# Patient Record
Sex: Male | Born: 1955 | Race: White | Hispanic: No | Marital: Married | Smoking: Former smoker
Health system: Southern US, Community
[De-identification: ages and names within clinical notes are randomized; demographics above are authoritative.]

## PROBLEM LIST (undated history)

## (undated) DIAGNOSIS — I779 Disorder of arteries and arterioles, unspecified: Secondary | ICD-10-CM

## (undated) DIAGNOSIS — K219 Gastro-esophageal reflux disease without esophagitis: Secondary | ICD-10-CM

## (undated) DIAGNOSIS — I2510 Atherosclerotic heart disease of native coronary artery without angina pectoris: Secondary | ICD-10-CM

## (undated) DIAGNOSIS — F1910 Other psychoactive substance abuse, uncomplicated: Secondary | ICD-10-CM

## (undated) DIAGNOSIS — Z860100 Personal history of colon polyps, unspecified: Secondary | ICD-10-CM

## (undated) DIAGNOSIS — J45909 Unspecified asthma, uncomplicated: Secondary | ICD-10-CM

## (undated) DIAGNOSIS — M1990 Unspecified osteoarthritis, unspecified site: Secondary | ICD-10-CM

## (undated) DIAGNOSIS — E785 Hyperlipidemia, unspecified: Secondary | ICD-10-CM

## (undated) DIAGNOSIS — I6529 Occlusion and stenosis of unspecified carotid artery: Secondary | ICD-10-CM

## (undated) DIAGNOSIS — Z9289 Personal history of other medical treatment: Secondary | ICD-10-CM

## (undated) DIAGNOSIS — I10 Essential (primary) hypertension: Secondary | ICD-10-CM

## (undated) DIAGNOSIS — C801 Malignant (primary) neoplasm, unspecified: Secondary | ICD-10-CM

## (undated) HISTORY — DX: Disorder of arteries and arterioles, unspecified: I77.9

## (undated) HISTORY — DX: Malignant (primary) neoplasm, unspecified: C80.1

## (undated) HISTORY — PX: TONSILLECTOMY AND ADENOIDECTOMY: SUR1326

## (undated) HISTORY — PX: TUBAL LIGATION: SHX77

## (undated) HISTORY — DX: Personal history of colon polyps, unspecified: Z86.0100

## (undated) HISTORY — DX: Atherosclerotic heart disease of native coronary artery without angina pectoris: I25.10

## (undated) HISTORY — DX: Personal history of other medical treatment: Z92.89

## (undated) HISTORY — DX: Essential (primary) hypertension: I10

## (undated) HISTORY — DX: Hyperlipidemia, unspecified: E78.5

## (undated) HISTORY — DX: Unspecified osteoarthritis, unspecified site: M19.90

## (undated) HISTORY — DX: Unspecified asthma, uncomplicated: J45.909

## (undated) HISTORY — DX: Other psychoactive substance abuse, uncomplicated: F19.10

## (undated) HISTORY — DX: Personal history of colonic polyps: Z86.010

## (undated) HISTORY — PX: CYST EXCISION PERINEAL: SHX6278

## (undated) HISTORY — DX: Occlusion and stenosis of unspecified carotid artery: I65.29

---

## 2004-10-16 ENCOUNTER — Ambulatory Visit: Payer: Self-pay | Admitting: Gastroenterology

## 2007-06-15 HISTORY — PX: CAROTID ENDARTERECTOMY: SUR193

## 2008-01-04 ENCOUNTER — Ambulatory Visit: Payer: Self-pay | Admitting: Family Medicine

## 2008-01-04 DIAGNOSIS — Z7289 Other problems related to lifestyle: Secondary | ICD-10-CM | POA: Insufficient documentation

## 2008-01-08 ENCOUNTER — Ambulatory Visit: Payer: Self-pay | Admitting: Vascular Surgery

## 2008-01-09 ENCOUNTER — Ambulatory Visit: Payer: Self-pay | Admitting: Vascular Surgery

## 2008-01-09 ENCOUNTER — Ambulatory Visit: Payer: Self-pay | Admitting: Cardiology

## 2008-01-09 ENCOUNTER — Other Ambulatory Visit: Payer: Self-pay

## 2008-01-11 ENCOUNTER — Inpatient Hospital Stay: Payer: Self-pay | Admitting: Vascular Surgery

## 2008-01-12 ENCOUNTER — Other Ambulatory Visit: Payer: Self-pay

## 2008-03-08 DIAGNOSIS — E785 Hyperlipidemia, unspecified: Secondary | ICD-10-CM | POA: Insufficient documentation

## 2008-03-08 DIAGNOSIS — L570 Actinic keratosis: Secondary | ICD-10-CM | POA: Insufficient documentation

## 2008-04-16 ENCOUNTER — Ambulatory Visit: Payer: Self-pay | Admitting: Gastroenterology

## 2008-04-16 LAB — HM COLONOSCOPY

## 2009-03-27 ENCOUNTER — Ambulatory Visit: Payer: Self-pay | Admitting: Family Medicine

## 2009-05-22 DIAGNOSIS — E559 Vitamin D deficiency, unspecified: Secondary | ICD-10-CM | POA: Insufficient documentation

## 2009-08-14 DIAGNOSIS — I2510 Atherosclerotic heart disease of native coronary artery without angina pectoris: Secondary | ICD-10-CM | POA: Insufficient documentation

## 2012-07-14 LAB — CBC AND DIFFERENTIAL
HCT: 47 % (ref 41–53)
Hemoglobin: 14.3 g/dL (ref 13.5–17.5)
Neutrophils Absolute: 60 /uL
Platelets: 258 10*3/uL (ref 150–399)
WBC: 8.8 10^3/mL

## 2012-07-14 LAB — TSH: TSH: 1.45 u[IU]/mL (ref 0.41–5.90)

## 2012-10-12 DIAGNOSIS — N411 Chronic prostatitis: Secondary | ICD-10-CM | POA: Insufficient documentation

## 2012-10-12 DIAGNOSIS — N401 Enlarged prostate with lower urinary tract symptoms: Secondary | ICD-10-CM | POA: Insufficient documentation

## 2012-10-12 DIAGNOSIS — N138 Other obstructive and reflux uropathy: Secondary | ICD-10-CM | POA: Insufficient documentation

## 2013-02-15 ENCOUNTER — Ambulatory Visit: Payer: Self-pay | Admitting: Unknown Physician Specialty

## 2013-04-12 ENCOUNTER — Ambulatory Visit: Payer: Self-pay | Admitting: Unknown Physician Specialty

## 2013-05-14 LAB — BASIC METABOLIC PANEL
BUN: 17 mg/dL (ref 4–21)
Creatinine: 1 mg/dL (ref 0.6–1.3)
Glucose: 102 mg/dL
Potassium: 4.9 mmol/L (ref 3.4–5.3)
Sodium: 137 mmol/L (ref 137–147)

## 2013-05-14 LAB — HEPATIC FUNCTION PANEL
ALT: 16 U/L (ref 10–40)
AST: 22 U/L (ref 14–40)
Alkaline Phosphatase: 64 U/L (ref 25–125)
Bilirubin, Total: 0.4 mg/dL

## 2013-11-15 LAB — LIPID PANEL
Cholesterol: 181 mg/dL (ref 0–200)
HDL: 43 mg/dL (ref 35–70)
LDL Cholesterol: 114 mg/dL
LDl/HDL Ratio: 2.7
Triglycerides: 118 mg/dL (ref 40–160)

## 2014-05-08 DIAGNOSIS — M1710 Unilateral primary osteoarthritis, unspecified knee: Secondary | ICD-10-CM | POA: Insufficient documentation

## 2014-05-08 DIAGNOSIS — M179 Osteoarthritis of knee, unspecified: Secondary | ICD-10-CM | POA: Insufficient documentation

## 2014-10-08 DIAGNOSIS — Z87898 Personal history of other specified conditions: Secondary | ICD-10-CM | POA: Insufficient documentation

## 2014-10-18 DIAGNOSIS — I10 Essential (primary) hypertension: Secondary | ICD-10-CM | POA: Insufficient documentation

## 2014-10-18 DIAGNOSIS — F17201 Nicotine dependence, unspecified, in remission: Secondary | ICD-10-CM | POA: Insufficient documentation

## 2014-12-05 ENCOUNTER — Ambulatory Visit (INDEPENDENT_AMBULATORY_CARE_PROVIDER_SITE_OTHER): Payer: BLUE CROSS/BLUE SHIELD | Admitting: Family Medicine

## 2014-12-05 ENCOUNTER — Encounter: Payer: Self-pay | Admitting: Family Medicine

## 2014-12-05 VITALS — BP 142/64 | HR 72 | Temp 98.8°F | Resp 16 | Ht 69.0 in | Wt 204.0 lb

## 2014-12-05 DIAGNOSIS — F32A Depression, unspecified: Secondary | ICD-10-CM

## 2014-12-05 DIAGNOSIS — IMO0002 Reserved for concepts with insufficient information to code with codable children: Secondary | ICD-10-CM

## 2014-12-05 DIAGNOSIS — Z0000 Encounter for general adult medical examination without abnormal findings: Secondary | ICD-10-CM

## 2014-12-05 DIAGNOSIS — R739 Hyperglycemia, unspecified: Secondary | ICD-10-CM | POA: Diagnosis not present

## 2014-12-05 DIAGNOSIS — F329 Major depressive disorder, single episode, unspecified: Secondary | ICD-10-CM

## 2014-12-05 DIAGNOSIS — H8113 Benign paroxysmal vertigo, bilateral: Secondary | ICD-10-CM | POA: Diagnosis not present

## 2014-12-05 MED ORDER — BUPROPION HCL ER (XL) 150 MG PO TB24: 150.0000 mg | ORAL_TABLET | Freq: Every day | ORAL | Status: DC

## 2014-12-05 NOTE — Progress Notes (Signed)
Patient ID: Christopher Villegas, male   DOB: 1956-01-15, 59 y.o.   MRN: [id number] Patient: Christopher Villegas, Male    DOB: Sep 04, 1955, 59 y.o.   MRN: [id number] Visit Date: 12/05/2014  Today's Provider: Wilhemena Durie, MD   Chief Complaint  Patient presents with  . Annual Exam   Subjective:  Christopher Villegas is a 59 y.o. male who presents today for health maintenance and complete physical. He feels well. He reports exercising none. He reports he is sleeping well. PSA is updated with Dr. Jacqlyn Larsen in September or October last year and results was about 1.1. Tdap in 2009. Zoster updated. Never had Pneumovax.  Vertigo Positional, when he bends over, when he lays down on the left side of his body and when his chiropractor moves up the table. This last for a few seconds and does not notice this at any other time.  Depression He stopped smoking in April and thinks this is related to that. Feeling down some and little interest in things. Wanted to discuss this today. Patient is sort scores to the PACU 9 but admits to some mild depressed mood and mild anhedonia. He would like to try specifically Wellbutrin for his mild depression   Review of Systems  Constitutional: Negative.   HENT: Negative.   Eyes: Negative.   Respiratory: Negative.   Cardiovascular: Negative.   Gastrointestinal: Negative.   Endocrine: Negative.   Genitourinary: Negative.   Musculoskeletal: Negative.   Skin: Negative.   Allergic/Immunologic: Negative.   Neurological: Positive for dizziness and light-headedness.  Hematological: Negative.   Psychiatric/Behavioral: Positive for dysphoric mood.    History   Social History  . Marital Status: Married    Spouse Name: Corporate treasurer  . Number of Children: 2  . Years of Education: 16   Occupational History  . [employer]    Social History Main Topics  . Smoking status: Former Smoker -- 1.00 packs/day for 40 years    Types: Cigarettes    Quit date: 09/29/2014  . Smokeless tobacco:  Never Used  . Alcohol Use: No  . Drug Use: No  . Sexual Activity: Yes   Other Topics Concern  . Not on file   Social History Narrative    Patient Active Problem List   Diagnosis Date Noted  . BP (high blood pressure) 10/18/2014  . 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 Arthritis of knee, degenerative 05/08/2014  . Chronic prostatitis 10/12/2012  . Benign prostatic hyperplasia with urinary obstruction 10/12/2012  . Atherosclerosis of coronary artery 08/14/2009  . Avitaminosis D 05/22/2009  . Hypercholesteremia 03/08/2008  . Actinic keratoses 03/08/2008  . Alcohol use 01/04/2008    Past Surgical History  Procedure Laterality Date  . Tonsillectomy and adenoidectomy    . Cyst excision perineal    . Carotid endarterectomy      His family history includes Arthritis in his mother; Autoimmune disease in his sister; Cancer in his mother; Healthy in his son and son; Heart disease in his father; Hyperlipidemia in his mother; Hypertension in his father and mother; Kidney failure in his father; Lupus in his sister.    Outpatient Prescriptions Prior to Visit  Medication Sig Dispense Refill  . Cholecalciferol (VITAMIN D) 2000 UNITS tablet Take 1 tablet by mouth daily.    Marland Kitchen aspirin 81 MG tablet Take 1 tablet by mouth daily.    . rosuvastatin (CRESTOR) 10 MG tablet Take 1 tablet by mouth at bedtime.     No  facility-administered medications prior to visit.    Patient Care Team: Jerrol Banana., MD as PCP - General (Family Medicine)     Objective:   Vitals:  Filed Vitals:   12/05/14 0947  BP: 142/64  Pulse: 72  Temp: 98.8 F (37.1 C)  Resp: 16  Height: 5\' 9"  (1.753 m)  Weight: 204 lb (92.534 kg)    Physical Exam  Constitutional: He is oriented to person, place, and time. He appears well-developed and well-nourished.  HENT:  Head: Normocephalic and atraumatic.  Right Ear: External ear normal.  Left Ear: External ear normal.  Nose: Nose normal.  Mouth/Throat:  Oropharynx is clear and moist.  Eyes: Conjunctivae are normal. Pupils are equal, round, and reactive to light.  Neck: Normal range of motion. Neck supple. No tracheal deviation present.  Cardiovascular: Normal rate, regular rhythm, normal heart sounds and intact distal pulses.   No murmur heard. Pulmonary/Chest: Effort normal and breath sounds normal. No respiratory distress. He has no wheezes. He has no rales.  Abdominal: Soft. Bowel sounds are normal. There is no tenderness. There is no rebound.  Musculoskeletal: Normal range of motion. He exhibits no edema or tenderness.  Neurological: He is alert and oriented to person, place, and time.  Skin: Skin is warm and dry.  Psychiatric: He has a normal mood and affect. His behavior is normal.     Depression Screen PHQ 2/9 Scores 12/05/2014  PHQ - 2 Score 2  PHQ- 9 Score 2      Assessment & Plan:     Routine Health Maintenance and Physical Exam  Exercise Activities and Dietary recommendations Goals    None      Immunization History  Administered Date(s) Administered  . Tdap 03/08/2008  . Zoster 04/23/2013    Health Maintenance  Topic Date Due  . HIV Screening  04/30/1969  . INFLUENZA VACCINE  01/13/2015  . TETANUS/TDAP  03/08/2018  . COLONOSCOPY  04/16/2018  . ZOSTAVAX  Completed    1. Annual physical exam Check labs, PSA is done with Dr. Jacqlyn Larsen. - CBC w/Diff - COMPLETE METABOLIC PANEL WITH GFR - Lipid Panel With LDL/HDL Ratio - TSH - POCT Urinalysis Dipstick  2. Positional vertigo, bilateral Follow as needed.  3. Hyperglycemia Check labs due to last level slightly elevated. - HgB A1c  4. Depression New. Start treatment and re check next visit. PHQ9 score 2 today. - buPROPion (WELLBUTRIN XL) 150 MG 24 hr tablet; Take 1 tablet (150 mg total) by mouth daily.  Dispense: 30 tablet; Refill: 12  5. Tobacco abuse Patient quit smoking in 09/29/2014  6. Carotid artery disease last status post carotid artery  endarterectomy Followed by vascular yearly  7. Skin cancer/S CC   I have done the exam and reviewed the above chart and it is accurate to the best of my knowledge.  Patient was seen and examined by Dr. Eulas Post and note was scribed by Theressa Millard, RMA.

## 2014-12-06 ENCOUNTER — Telehealth: Payer: Self-pay

## 2014-12-06 LAB — COMPREHENSIVE METABOLIC PANEL
ALT: 18 IU/L (ref 0–44)
AST: 20 IU/L (ref 0–40)
Albumin/Globulin Ratio: 1.8 (ref 1.1–2.5)
Albumin: 4.6 g/dL (ref 3.6–4.8)
Alkaline Phosphatase: 54 IU/L (ref 39–117)
BUN/Creatinine Ratio: 14 (ref 10–22)
BUN: 15 mg/dL (ref 8–27)
Bilirubin Total: 0.5 mg/dL (ref 0.0–1.2)
CO2: 26 mmol/L (ref 18–29)
Calcium: 9.6 mg/dL (ref 8.6–10.2)
Chloride: 100 mmol/L (ref 97–108)
Creatinine, Ser: 1.1 mg/dL (ref 0.76–1.27)
GFR calc Af Amer: 84 mL/min/{1.73_m2} (ref >59)
GFR calc non Af Amer: 73 mL/min/{1.73_m2} (ref >59)
Globulin, Total: 2.5 g/dL (ref 1.5–4.5)
Glucose: 96 mg/dL (ref 65–99)
Potassium: 5.3 mmol/L — ABNORMAL HIGH (ref 3.5–5.2)
Sodium: 140 mmol/L (ref 134–144)
Total Protein: 7.1 g/dL (ref 6.0–8.5)

## 2014-12-06 LAB — CBC WITH DIFFERENTIAL/PLATELET
Basophils Absolute: 0 10*3/uL (ref 0.0–0.2)
Basos: 0 %
EOS (ABSOLUTE): 0.2 10*3/uL (ref 0.0–0.4)
Eos: 2 %
Hematocrit: 44.6 % (ref 37.5–51.0)
Hemoglobin: 15.4 g/dL (ref 12.6–17.7)
Immature Grans (Abs): 0 10*3/uL (ref 0.0–0.1)
Immature Granulocytes: 0 %
Lymphocytes Absolute: 2.9 10*3/uL (ref 0.7–3.1)
Lymphs: 35 %
MCH: 31.2 pg (ref 26.6–33.0)
MCHC: 34.5 g/dL (ref 31.5–35.7)
MCV: 91 fL (ref 79–97)
Monocytes Absolute: 0.5 10*3/uL (ref 0.1–0.9)
Monocytes: 6 %
Neutrophils Absolute: 4.6 10*3/uL (ref 1.4–7.0)
Neutrophils: 57 %
Platelets: 239 10*3/uL (ref 150–379)
RBC: 4.93 x10E6/uL (ref 4.14–5.80)
RDW: 13.6 % (ref 12.3–15.4)
WBC: 8.1 10*3/uL (ref 3.4–10.8)

## 2014-12-06 LAB — HEMOGLOBIN A1C
Est. average glucose Bld gHb Est-mCnc: 114 mg/dL
Hgb A1c MFr Bld: 5.6 % (ref 4.8–5.6)

## 2014-12-06 LAB — TSH: TSH: 1.19 u[IU]/mL (ref 0.450–4.500)

## 2014-12-06 LAB — LIPID PANEL WITH LDL/HDL RATIO
Cholesterol, Total: 238 mg/dL — ABNORMAL HIGH (ref 100–199)
HDL: 48 mg/dL (ref >39)
LDL Calculated: 173 mg/dL — ABNORMAL HIGH (ref 0–99)
LDl/HDL Ratio: 3.6 ratio units (ref 0.0–3.6)
Triglycerides: 83 mg/dL (ref 0–149)
VLDL Cholesterol Cal: 17 mg/dL (ref 5–40)

## 2014-12-06 NOTE — Telephone Encounter (Signed)
-----   Message from Jerrol Banana., MD sent at 12/06/2014  8:06 AM EDT ----- Labs stable. Cholesterol high. Recommend trying statin again.

## 2014-12-06 NOTE — Telephone Encounter (Signed)
LMTCB with his wife=-aa

## 2014-12-06 NOTE — Telephone Encounter (Signed)
Pt advised. Pt use to be on Crestor last year and his last cholesterol level was controlled on it but his CK was elevated and he had muscle pain so he stopped at that time. He has not tried anything else. Do you want to him to try a different medication? Please review. Pt advised may not be able to call back till you come back from vacation. Thank you-aa

## 2014-12-18 MED ORDER — ROSUVASTATIN CALCIUM 5 MG PO TABS: 5.0000 mg | ORAL_TABLET | Freq: Every day | ORAL | Status: DC

## 2014-12-18 NOTE — Telephone Encounter (Signed)
Spoke with Dr. Nathaneil Canary Crestor at 5 mg daily. Patient advised and RX sent in-aa

## 2015-02-06 ENCOUNTER — Ambulatory Visit (INDEPENDENT_AMBULATORY_CARE_PROVIDER_SITE_OTHER): Payer: BLUE CROSS/BLUE SHIELD | Admitting: Family Medicine

## 2015-02-06 ENCOUNTER — Encounter: Payer: Self-pay | Admitting: Family Medicine

## 2015-02-06 VITALS — BP 120/64 | HR 78 | Temp 98.3°F | Resp 16 | Wt 207.0 lb

## 2015-02-06 DIAGNOSIS — F329 Major depressive disorder, single episode, unspecified: Secondary | ICD-10-CM | POA: Diagnosis not present

## 2015-02-06 DIAGNOSIS — F32A Depression, unspecified: Secondary | ICD-10-CM

## 2015-02-06 NOTE — Progress Notes (Signed)
       Patient: Christopher Villegas Male    DOB: January 22, 1956   59 y.o.   MRN: [id number] Visit Date: 02/06/2015  Today's Provider: Wilhemena Durie, MD   Chief Complaint  Patient presents with  . Depression    follow- up   Subjective:    HPI Depression follow-up: Last office visit was 12/05/2014. During that visit patient scored a 2 on the PHQ-P Depression screening tool. Patient was started on Bupropion XL 150mg  daily.  Patient comes in today stating he feels better since starting the Buproprion. Patient reports good compliance with treatment, good tolerance and good symptom control.     Allergies  Allergen Reactions  . Plavix [Clopidogrel] Rash   Previous Medications   BUPROPION (WELLBUTRIN XL) 150 MG 24 HR TABLET    Take 1 tablet (150 mg total) by mouth daily.   CHOLECALCIFEROL (VITAMIN D) 2000 UNITS TABLET    Take 1 tablet by mouth daily.   MISC NATURAL PRODUCTS (OSTEO BI-FLEX JOINT SHIELD PO)    Take 2 tablets by mouth daily.   OMEGA-3 FATTY ACIDS (FISH OIL) 1200 MG CAPS    Take 2 capsules by mouth 2 (two) times daily.   ROSUVASTATIN (CRESTOR) 5 MG TABLET    Take 1 tablet (5 mg total) by mouth daily.    Review of Systems  Constitutional: Negative.   Respiratory: Negative.   Cardiovascular: Negative.   Psychiatric/Behavioral: Positive for sleep disturbance (sometimes has trouble getting back to sleep in the middle of the night if awakend).  All other systems reviewed and are negative.   Social History  Substance Use Topics  . Smoking status: Former Smoker -- 1.00 packs/day for 40 years    Types: Cigarettes    Quit date: 09/29/2014  . Smokeless tobacco: Never Used  . Alcohol Use: No   Objective:   BP 120/64 mmHg  Pulse 78  Temp(Src) 98.3 F (36.8 C) (Oral)  Resp 16  Wt 207 lb (93.895 kg)  SpO2 96%  Physical Exam  Constitutional: He appears well-developed and well-nourished.  HENT:  Head: Normocephalic and atraumatic.  Right Ear: External ear normal.  Left Ear:  External ear normal.  Nose: Nose normal.  Eyes: Conjunctivae are normal.  Neck: Neck supple.  Cardiovascular: Normal rate, regular rhythm and normal heart sounds.   Pulmonary/Chest: Effort normal and breath sounds normal.  Abdominal: Soft.  Skin: Skin is warm and dry.  Psychiatric: He has a normal mood and affect. His behavior is normal. Judgment and thought content normal.        Assessment & Plan:       Depression  Markedly improved with bupropion. Discussed this with patient. Continue present dose for at least a year.  ASCVD  All risk factors treated and patient has quit smoking. I have done the exam and reviewed the above chart and it is accurate to the best of my knowledge.       Lj Miyamoto Cranford Mon, MD  [facility]

## 2015-02-24 ENCOUNTER — Other Ambulatory Visit: Payer: Self-pay | Admitting: Family Medicine

## 2015-02-24 NOTE — Telephone Encounter (Signed)
yes

## 2015-02-24 NOTE — Telephone Encounter (Signed)
Advised patient as below. Called in Wellbutrin SR into patient's pharmacy. Updated med list.

## 2015-02-24 NOTE — Telephone Encounter (Signed)
Please advise. Thanks.  

## 2015-02-24 NOTE — Telephone Encounter (Signed)
Pt wants to know if you can change his Wellbutrin Xl to SR.    Call back [phone]  Thanks Con Memos

## 2015-03-21 ENCOUNTER — Telehealth: Payer: Self-pay | Admitting: Family Medicine

## 2015-03-21 NOTE — Telephone Encounter (Signed)
Patient states he wants to stop Bupropion, this medication is contributing to him not sleeping well and he is sure it is from medication because this issue started when he started this medication. He wants to try and stay off of it for now. Spoke with Dr. Caryn Section and he said there is really no tapering schedule for this medication and with it been a low dose patient can go ahead and just stop taking it and we will follow. Patient advised-aa

## 2015-03-21 NOTE — Telephone Encounter (Signed)
Pt is requesting a call back.  Pt has questions about changing/stopping medication.  CB#[phone]/MW

## 2015-08-14 ENCOUNTER — Ambulatory Visit (INDEPENDENT_AMBULATORY_CARE_PROVIDER_SITE_OTHER): Payer: BLUE CROSS/BLUE SHIELD | Admitting: Family Medicine

## 2015-08-14 ENCOUNTER — Encounter: Payer: Self-pay | Admitting: Family Medicine

## 2015-08-14 VITALS — BP 156/82 | HR 72 | Resp 18 | Wt 209.0 lb

## 2015-08-14 DIAGNOSIS — I10 Essential (primary) hypertension: Secondary | ICD-10-CM

## 2015-08-14 DIAGNOSIS — F32A Depression, unspecified: Secondary | ICD-10-CM | POA: Insufficient documentation

## 2015-08-14 DIAGNOSIS — K219 Gastro-esophageal reflux disease without esophagitis: Secondary | ICD-10-CM | POA: Diagnosis not present

## 2015-08-14 DIAGNOSIS — R252 Cramp and spasm: Secondary | ICD-10-CM

## 2015-08-14 DIAGNOSIS — R739 Hyperglycemia, unspecified: Secondary | ICD-10-CM | POA: Insufficient documentation

## 2015-08-14 DIAGNOSIS — E7800 Pure hypercholesterolemia, unspecified: Secondary | ICD-10-CM | POA: Diagnosis not present

## 2015-08-14 DIAGNOSIS — F329 Major depressive disorder, single episode, unspecified: Secondary | ICD-10-CM | POA: Diagnosis not present

## 2015-08-14 MED ORDER — ESOMEPRAZOLE MAGNESIUM 40 MG PO CPDR: 40.0000 mg | DELAYED_RELEASE_CAPSULE | Freq: Every day | ORAL | Status: DC

## 2015-08-14 MED ORDER — LOSARTAN POTASSIUM 50 MG PO TABS: 50.0000 mg | ORAL_TABLET | Freq: Every day | ORAL | Status: DC

## 2015-08-14 NOTE — Patient Instructions (Signed)
For hand cramps try Over the counter magnesium Oxide twice daily for 1 month then add tonic water if symptoms are not improving or persisting.

## 2015-08-14 NOTE — Progress Notes (Signed)
Patient ID: Christopher Villegas, male   DOB: 1955-08-29, 60 y.o.   MRN: [id number]    Subjective:  HPI  Patient is here for 6 months follow up.  Hyperlipidemia: Patient started to take Crestor the past 2 weeks not since June of 2016 when we spoke with him after looking at results. He forgot. Lab Results  Component Value Date   CHOL 238* 12/05/2014   HDL 48 12/05/2014   LDLCALC 173* 12/05/2014   TRIG 83 12/05/2014   Depression: He stopped Bupropion in October due to he was having hard time sleeping since been on this medication. He states he has been doing fine without medication sleep wise and emotionally. He does not want to start another medication at this time.  Heartburn: HE has had issues with this since his last visit, mainly at night time. He states he knows that it is how he is eating and how much and how late in the evening, he has adjusted his diet and heartburn is better with this. He also took Nexium 2 weeks on and then off and then waited a few weeks and symptoms came back so he went back on it for 2 weeks but he is not sure how long he should be taking this.  Prior to Admission medications   Medication Sig Start Date End Date Taking? Authorizing Provider  Cholecalciferol (VITAMIN D) 2000 UNITS tablet Take 1 tablet by mouth daily. 09/20/11  Yes Historical Provider, MD  Misc Natural Products (OSTEO BI-FLEX JOINT SHIELD PO) Take 2 tablets by mouth daily.   Yes Historical Provider, MD  Omega-3 Fatty Acids (FISH OIL) 1200 MG CAPS Take 2 capsules by mouth 2 (two) times daily.   Yes Historical Provider, MD  rosuvastatin (CRESTOR) 5 MG tablet Take 1 tablet (5 mg total) by mouth daily. 12/18/14  Yes Dejha King Maceo Pro., MD    Patient Active Problem List   Diagnosis Date Noted  . BP (high blood pressure) 10/18/2014  . 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 H/O neoplasm 10/08/2014  . Arthritis of knee, degenerative 05/08/2014  . Chronic prostatitis 10/12/2012  . Benign prostatic  hyperplasia with urinary obstruction 10/12/2012  . Atherosclerosis of coronary artery 08/14/2009  . Avitaminosis D 05/22/2009  . Hypercholesteremia 03/08/2008  . Actinic keratoses 03/08/2008  . Alcohol use ([place]) 01/04/2008    No past medical history on file.  Social History   Social History  . Marital Status: Married    Spouse Name: Corporate treasurer  . Number of Children: 2  . Years of Education: 16   Occupational History  . [employer]    Social History Main Topics  . Smoking status: Former Smoker -- 1.00 packs/day for 40 years    Types: Cigarettes    Quit date: 09/29/2014  . Smokeless tobacco: Never Used  . Alcohol Use: No  . Drug Use: No  . Sexual Activity: Yes   Other Topics Concern  . Not on file   Social History Narrative    Allergies  Allergen Reactions  . Plavix [Clopidogrel] Rash    Review of Systems  Constitutional: Negative.   Respiratory: Negative.   Cardiovascular: Negative.   Gastrointestinal: Positive for heartburn.  Musculoskeletal: Negative.   Psychiatric/Behavioral: Negative.     Immunization History  Administered Date(s) Administered  . Tdap 03/08/2008  . Zoster 04/23/2013   Objective:  BP 156/82 mmHg  Pulse 72  Resp 18  Wt 209 lb (94.802 kg)  Physical Exam  Constitutional:  He is oriented to person, place, and time and well-developed, well-nourished, and in no distress.  HENT:  Head: Normocephalic and atraumatic.  Eyes: Conjunctivae are normal. Pupils are equal, round, and reactive to light.  Neck: Normal range of motion. Neck supple.  Cardiovascular: Normal rate, regular rhythm, normal heart sounds and intact distal pulses.   No murmur heard. Pulmonary/Chest: Effort normal and breath sounds normal. No respiratory distress. He has no wheezes.  Abdominal: Soft.  Musculoskeletal: He exhibits no edema or tenderness.  Neurological: He is alert and oriented to person, place, and time. Gait normal.  Skin: Skin is warm and  dry.  Psychiatric: Mood, memory, affect and judgment normal.    Lab Results  Component Value Date   WBC 8.1 12/05/2014   HGB 14.3 07/14/2012   HCT 44.6 12/05/2014   PLT 239 12/05/2014   GLUCOSE 96 12/05/2014   CHOL 238* 12/05/2014   TRIG 83 12/05/2014   HDL 48 12/05/2014   LDLCALC 173* 12/05/2014   TSH 1.190 12/05/2014   HGBA1C 5.6 12/05/2014    CMP     Component Value Date/Time   NA 140 12/05/2014 1042   K 5.3* 12/05/2014 1042   CL 100 12/05/2014 1042   CO2 26 12/05/2014 1042   GLUCOSE 96 12/05/2014 1042   BUN 15 12/05/2014 1042   CREATININE 1.10 12/05/2014 1042   CREATININE 1.0 05/14/2013   CALCIUM 9.6 12/05/2014 1042   PROT 7.1 12/05/2014 1042   ALBUMIN 4.6 12/05/2014 1042   AST 20 12/05/2014 1042   ALT 18 12/05/2014 1042   ALKPHOS 54 12/05/2014 1042   BILITOT 0.5 12/05/2014 1042   GFRNONAA 73 12/05/2014 1042   GFRAA 84 12/05/2014 1042    Assessment and Plan :  1. Depression Stable off medication. Follow. - TSH  2. Essential hypertension elevated today. Will start medication. - losartan (COZAAR) 50 MG tablet; Take 1 tablet (50 mg total) by mouth daily.  Dispense: 30 tablet; Refill: 12 - TSH  3. Hypercholesteremia Check levels on medication. - Comprehensive metabolic panel - Lipid Panel With LDL/HDL Ratio  4. Gastroesophageal reflux disease without esophagitis New. Will start medication. Discussed working on his habits also. - esomeprazole (NEXIUM) 40 MG capsule; Take 1 capsule (40 mg total) by mouth daily.  Dispense: 30 capsule; Refill: 12 - CBC with Differential/Platelet - TSH  5. Hyperglycemia - HgB A1c  6. Hand cramps Try mag oxide twice daily and then tonic water if mag ox does not work.  Patient was seen and examined by Dr. Eulas Post and note was scribed by Theressa Millard, RMA. I have done the exam and reviewed the above chart and it is accurate to the best of my knowledge.    Miguel Aschoff MD [facility] 08/14/2015 8:05 AM

## 2015-08-19 LAB — CBC WITH DIFFERENTIAL/PLATELET
Basophils Absolute: 0.1 10*3/uL (ref 0.0–0.2)
Basos: 1 %
EOS (ABSOLUTE): 0.1 10*3/uL (ref 0.0–0.4)
Eos: 1 %
Hematocrit: 44.3 % (ref 37.5–51.0)
Hemoglobin: 16.2 g/dL (ref 12.6–17.7)
Immature Grans (Abs): 0 10*3/uL (ref 0.0–0.1)
Immature Granulocytes: 0 %
Lymphocytes Absolute: 3.5 10*3/uL — ABNORMAL HIGH (ref 0.7–3.1)
Lymphs: 34 %
MCH: 32.3 pg (ref 26.6–33.0)
MCHC: 36.6 g/dL — ABNORMAL HIGH (ref 31.5–35.7)
MCV: 88 fL (ref 79–97)
Monocytes Absolute: 0.7 10*3/uL (ref 0.1–0.9)
Monocytes: 7 %
Neutrophils Absolute: 6.1 10*3/uL (ref 1.4–7.0)
Neutrophils: 57 %
Platelets: 370 10*3/uL (ref 150–379)
RBC: 5.01 x10E6/uL (ref 4.14–5.80)
RDW: 13 % (ref 12.3–15.4)
WBC: 10.5 10*3/uL (ref 3.4–10.8)

## 2015-08-19 LAB — COMPREHENSIVE METABOLIC PANEL
ALT: 22 IU/L (ref 0–44)
AST: 27 IU/L (ref 0–40)
Albumin/Globulin Ratio: 1.8 (ref 1.1–2.5)
Albumin: 4.7 g/dL (ref 3.6–4.8)
Alkaline Phosphatase: 68 IU/L (ref 39–117)
BUN/Creatinine Ratio: 13 (ref 10–22)
BUN: 16 mg/dL (ref 8–27)
Bilirubin Total: 0.6 mg/dL (ref 0.0–1.2)
CO2: 23 mmol/L (ref 18–29)
Calcium: 9.4 mg/dL (ref 8.6–10.2)
Chloride: 94 mmol/L — ABNORMAL LOW (ref 96–106)
Creatinine, Ser: 1.26 mg/dL (ref 0.76–1.27)
GFR calc Af Amer: 71 mL/min/{1.73_m2} (ref >59)
GFR calc non Af Amer: 61 mL/min/{1.73_m2} (ref >59)
Globulin, Total: 2.6 g/dL (ref 1.5–4.5)
Glucose: 100 mg/dL — ABNORMAL HIGH (ref 65–99)
Potassium: 5.2 mmol/L (ref 3.5–5.2)
Sodium: 134 mmol/L (ref 134–144)
Total Protein: 7.3 g/dL (ref 6.0–8.5)

## 2015-08-19 LAB — TSH: TSH: 1.05 u[IU]/mL (ref 0.450–4.500)

## 2015-08-19 LAB — LIPID PANEL WITH LDL/HDL RATIO
Cholesterol, Total: 161 mg/dL (ref 100–199)
HDL: 41 mg/dL (ref >39)
LDL Calculated: 99 mg/dL (ref 0–99)
LDl/HDL Ratio: 2.4 ratio units (ref 0.0–3.6)
Triglycerides: 106 mg/dL (ref 0–149)
VLDL Cholesterol Cal: 21 mg/dL (ref 5–40)

## 2015-08-19 LAB — HEMOGLOBIN A1C
Est. average glucose Bld gHb Est-mCnc: 114 mg/dL
Hgb A1c MFr Bld: 5.6 % (ref 4.8–5.6)

## 2015-10-16 ENCOUNTER — Ambulatory Visit (INDEPENDENT_AMBULATORY_CARE_PROVIDER_SITE_OTHER): Payer: BLUE CROSS/BLUE SHIELD | Admitting: Family Medicine

## 2015-10-16 VITALS — BP 124/62 | HR 72 | Temp 98.2°F | Resp 16 | Wt 184.0 lb

## 2015-10-16 DIAGNOSIS — I10 Essential (primary) hypertension: Secondary | ICD-10-CM

## 2015-10-16 DIAGNOSIS — K219 Gastro-esophageal reflux disease without esophagitis: Secondary | ICD-10-CM | POA: Diagnosis not present

## 2015-10-16 NOTE — Progress Notes (Signed)
Patient ID: Christopher Villegas, male   DOB: [birth year], 60 y.o.   MRN: [id number]    Subjective:  HPI  Patient is here for 2 months follow up.  Hypertension: reading was elevated last time and patient was started on Losartan, he has no issues tolerating this medication so far. He checks his b/p every Sunday and the highest reading has been 138/72, most of the time stays in 131-132/67-69. BP Readings from Last 3 Encounters:  10/16/15 124/62  08/14/15 156/82  02/06/15 120/64   GERD: Patient was started on Nexium on his last visit. He states symptoms are much better, the only time he has had some heartburn is when he does extrenious exercise or labor he feels it some. He has also lost 25lb since his last visit and he has not tried to diet but just started to eat slower and chew his food more and he gets full faster and stops eating unlike before for example he could eat 6 hot dogs and fries for lunch, 19 chicken legs in 1 sitting. He feels better since making these changes.  Prior to Admission medications   Medication Sig Start Date End Date Taking? Authorizing Provider  Cholecalciferol (VITAMIN D) 2000 UNITS tablet Take 1 tablet by mouth daily. 09/20/11  Yes Historical Provider, MD  esomeprazole (NEXIUM) 40 MG capsule Take 1 capsule (40 mg total) by mouth daily. 08/14/15  Yes Richard Maceo Pro., MD  losartan (COZAAR) 50 MG tablet Take 1 tablet (50 mg total) by mouth daily. 08/14/15  Yes Richard Maceo Pro., MD  Misc Natural Products (OSTEO BI-FLEX JOINT SHIELD PO) Take 2 tablets by mouth daily.   Yes Historical Provider, MD  Omega-3 Fatty Acids (FISH OIL) 1200 MG CAPS Take 2 capsules by mouth 2 (two) times daily.   Yes Historical Provider, MD  rosuvastatin (CRESTOR) 5 MG tablet Take 1 tablet (5 mg total) by mouth daily. 12/18/14  Yes Richard Maceo Pro., MD    Patient Active Problem List   Diagnosis Date Noted  . Hyperglycemia 08/14/2015  . Gastroesophageal reflux disease without esophagitis  08/14/2015  . Depression 08/14/2015  . BP (high blood pressure) 10/18/2014  . 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 H/O neoplasm 10/08/2014  . Arthritis of knee, degenerative 05/08/2014  . Chronic prostatitis 10/12/2012  . Benign prostatic hyperplasia with urinary obstruction 10/12/2012  . Atherosclerosis of coronary artery 08/14/2009  . Avitaminosis D 05/22/2009  . Hypercholesteremia 03/08/2008  . Actinic keratoses 03/08/2008  . Alcohol use ([place]) 01/04/2008    No past medical history on file.  Social History   Social History  . Marital Status: Married    Spouse Name: Corporate treasurer  . Number of Children: 2  . Years of Education: 16   Occupational History  . [employer]    Social History Main Topics  . Smoking status: Former Smoker -- 1.00 packs/day for 40 years    Types: Cigarettes    Quit date: 09/29/2014  . Smokeless tobacco: Never Used  . Alcohol Use: No  . Drug Use: No  . Sexual Activity: Yes   Other Topics Concern  . Not on file   Social History Narrative    Allergies  Allergen Reactions  . Plavix [Clopidogrel] Rash    Review of Systems  Constitutional: Negative.   Respiratory: Negative.   Cardiovascular: Negative.   Gastrointestinal: Negative.   Musculoskeletal: Negative.     Immunization History  Administered Date(s) Administered  . Tdap 03/08/2008  .  Zoster 04/23/2013   Objective:  BP 124/62 mmHg  Pulse 72  Temp(Src) 98.2 F (36.8 C)  Resp 16  Wt 184 lb (83.462 kg)  Physical Exam  Constitutional: He is oriented to person, place, and time and well-developed, well-nourished, and in no distress.  HENT:  Head: Normocephalic and atraumatic.  Eyes: Conjunctivae are normal. Pupils are equal, round, and reactive to light.  Neck: Normal range of motion. Neck supple.  Cardiovascular: Normal rate, regular rhythm, normal heart sounds and intact distal pulses.   No murmur heard. Pulmonary/Chest: Effort normal and breath sounds  normal. No respiratory distress. He has no wheezes.  Musculoskeletal: He exhibits no edema or tenderness.  Neurological: He is alert and oriented to person, place, and time. Gait normal.  Psychiatric: Mood, memory, affect and judgment normal.    Lab Results  Component Value Date   WBC 10.5 08/18/2015   HGB 14.3 07/14/2012   HCT 44.3 08/18/2015   PLT 370 08/18/2015   GLUCOSE 100* 08/18/2015   CHOL 161 08/18/2015   TRIG 106 08/18/2015   HDL 41 08/18/2015   LDLCALC 99 08/18/2015   TSH 1.050 08/18/2015   HGBA1C 5.6 08/18/2015    CMP     Component Value Date/Time   NA 134 08/18/2015 0800   K 5.2 08/18/2015 0800   CL 94* 08/18/2015 0800   CO2 23 08/18/2015 0800   GLUCOSE 100* 08/18/2015 0800   BUN 16 08/18/2015 0800   CREATININE 1.26 08/18/2015 0800   CREATININE 1.0 05/14/2013   CALCIUM 9.4 08/18/2015 0800   PROT 7.3 08/18/2015 0800   ALBUMIN 4.7 08/18/2015 0800   AST 27 08/18/2015 0800   ALT 22 08/18/2015 0800   ALKPHOS 68 08/18/2015 0800   BILITOT 0.6 08/18/2015 0800   GFRNONAA 61 08/18/2015 0800   GFRAA 71 08/18/2015 0800    Assessment and Plan :  1. Essential hypertension Better. Continue current medication.  2. Gastroesophageal reflux disease without esophagitis Better on Nexium. Patient states he has had some chest discomfort/heartburn/regurgitation at times. Discussed this in details with patient, he does not feel like this is cardiac, he feels great. Denies chest pain, no chest tightness. Advised patient if symptoms change we will need to refer him to cardiologist for further work up. I advised patient that I think he has heart disease just not sure if its mild or more severe. Follow for now. 3. Carotid vascular disease/Status post right carotid endarterectomy All risk factors treated. Patient has quit smoking. Patient was seen and examined by Dr. Eulas Post and note was scribed by Theressa Millard, RMA.     Miguel Aschoff MD [facility] 10/16/2015 1:39 PM

## 2015-10-17 ENCOUNTER — Telehealth: Payer: Self-pay

## 2015-10-17 DIAGNOSIS — R079 Chest pain, unspecified: Secondary | ICD-10-CM

## 2015-10-17 NOTE — Telephone Encounter (Signed)
Patient called back and states that he thought a lot about his appointment yesterday and wants to go ahead and get referred t cardiologist to make sure the chest discomfort/heratburn is not heart related issue. Wants to go ahead and just make sure. He saw Dr. Nehemiah Massed in 2009 and he states he does not care if its him or someone else you prefer him to see. Please review when able. Thanks-aa

## 2015-10-21 DIAGNOSIS — D229 Melanocytic nevi, unspecified: Secondary | ICD-10-CM | POA: Diagnosis not present

## 2015-10-21 DIAGNOSIS — Z85828 Personal history of other malignant neoplasm of skin: Secondary | ICD-10-CM | POA: Diagnosis not present

## 2015-10-21 DIAGNOSIS — L570 Actinic keratosis: Secondary | ICD-10-CM | POA: Diagnosis not present

## 2015-10-21 NOTE — Telephone Encounter (Signed)
Pt informed and voiced understanding of results. Referral orders placed.

## 2015-10-21 NOTE — Telephone Encounter (Signed)
Ok to se this up? Any preference on the doctor?-aa

## 2015-10-21 NOTE — Telephone Encounter (Signed)
Refer back to Dr Nehemiah Massed

## 2015-10-22 DIAGNOSIS — R0789 Other chest pain: Secondary | ICD-10-CM | POA: Diagnosis not present

## 2015-10-22 DIAGNOSIS — I10 Essential (primary) hypertension: Secondary | ICD-10-CM | POA: Diagnosis not present

## 2015-10-22 DIAGNOSIS — I208 Other forms of angina pectoris: Secondary | ICD-10-CM | POA: Diagnosis not present

## 2015-10-22 DIAGNOSIS — I6523 Occlusion and stenosis of bilateral carotid arteries: Secondary | ICD-10-CM | POA: Diagnosis not present

## 2015-11-12 DIAGNOSIS — E782 Mixed hyperlipidemia: Secondary | ICD-10-CM | POA: Diagnosis not present

## 2015-11-12 DIAGNOSIS — I10 Essential (primary) hypertension: Secondary | ICD-10-CM | POA: Diagnosis not present

## 2015-11-12 DIAGNOSIS — I6523 Occlusion and stenosis of bilateral carotid arteries: Secondary | ICD-10-CM | POA: Diagnosis not present

## 2015-11-12 DIAGNOSIS — I208 Other forms of angina pectoris: Secondary | ICD-10-CM | POA: Diagnosis not present

## 2015-12-10 DIAGNOSIS — E782 Mixed hyperlipidemia: Secondary | ICD-10-CM | POA: Diagnosis not present

## 2015-12-10 DIAGNOSIS — R238 Other skin changes: Secondary | ICD-10-CM | POA: Diagnosis not present

## 2015-12-10 DIAGNOSIS — I6523 Occlusion and stenosis of bilateral carotid arteries: Secondary | ICD-10-CM | POA: Diagnosis not present

## 2015-12-10 DIAGNOSIS — I10 Essential (primary) hypertension: Secondary | ICD-10-CM | POA: Diagnosis not present

## 2016-04-01 ENCOUNTER — Ambulatory Visit (INDEPENDENT_AMBULATORY_CARE_PROVIDER_SITE_OTHER): Payer: BLUE CROSS/BLUE SHIELD | Admitting: Family Medicine

## 2016-04-01 VITALS — BP 128/72 | HR 66 | Temp 98.4°F | Resp 16 | Ht 68.75 in | Wt 178.0 lb

## 2016-04-01 DIAGNOSIS — M791 Myalgia, unspecified site: Secondary | ICD-10-CM

## 2016-04-01 DIAGNOSIS — I10 Essential (primary) hypertension: Secondary | ICD-10-CM

## 2016-04-01 DIAGNOSIS — Z0000 Encounter for general adult medical examination without abnormal findings: Secondary | ICD-10-CM

## 2016-04-01 DIAGNOSIS — Z1211 Encounter for screening for malignant neoplasm of colon: Secondary | ICD-10-CM | POA: Diagnosis not present

## 2016-04-01 DIAGNOSIS — Z23 Encounter for immunization: Secondary | ICD-10-CM | POA: Diagnosis not present

## 2016-04-01 NOTE — Progress Notes (Signed)
Patient: Christopher Villegas, Male    DOB: 12/24/1955, 60 y.o.   MRN: [id number] Visit Date: 04/01/2016  Today's Provider: Wilhemena Durie, MD   Chief Complaint  Patient presents with  . Annual Exam   Subjective:  [name] is a 60 y.o. male who presents today for health maintenance and complete physical. He feels well. He reports exercising yes 6 times a week. He reports he is sleeping well. Immunization History  Administered Date(s) Administered  . Tdap 03/08/2008  . Zoster 04/23/2013   Last colonoscopy per records 04/16/08 had tubular adenoma but patient states he had one since with Dr Dionne Milo he thinks.  Review of Systems  Constitutional: Negative.   HENT: Negative.   Eyes: Negative.   Respiratory: Negative.   Cardiovascular: Negative.   Gastrointestinal: Negative.   Endocrine: Negative.   Genitourinary: Negative.   Musculoskeletal: Positive for myalgias (muscle soreness).  Skin: Negative.   Allergic/Immunologic: Negative.   Neurological: Negative.   Hematological: Negative.   Psychiatric/Behavioral: Negative.     Social History   Social History  . Marital status: Married    Spouse name: Corporate treasurer  . Number of children: 2  . Years of education: 16   Occupational History  . [employer]    Social History Main Topics  . Smoking status: Former Smoker    Packs/day: 1.00    Years: 40.00    Types: Cigarettes    Quit date: 09/29/2014  . Smokeless tobacco: Never Used  . Alcohol use No  . Drug use: No  . Sexual activity: Yes   Other Topics Concern  . Not on file   Social History Narrative  . No narrative on file    Patient Active Problem List   Diagnosis Date Noted  . Hyperglycemia 08/14/2015  . Gastroesophageal reflux disease without esophagitis 08/14/2015  . Depression 08/14/2015  . BP (high blood pressure) 10/18/2014  . 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 H/O neoplasm 10/08/2014  . Arthritis of knee, degenerative 05/08/2014  . Chronic  prostatitis 10/12/2012  . Benign prostatic hyperplasia with urinary obstruction 10/12/2012  . Atherosclerosis of coronary artery 08/14/2009  . Avitaminosis D 05/22/2009  . Hypercholesteremia 03/08/2008  . Actinic keratoses 03/08/2008  . Alcohol use 01/04/2008    Past Surgical History:  Procedure Laterality Date  . CAROTID ENDARTERECTOMY    . CYST EXCISION PERINEAL    . TONSILLECTOMY AND ADENOIDECTOMY      His family history includes Arthritis in his mother; Autoimmune disease in his sister; Cancer in his mother; Healthy in his son and son; Heart disease in his father; Hyperlipidemia in his mother; Hypertension in his father and mother; Kidney failure in his father; Lupus in his sister.    Outpatient Encounter Prescriptions as of 04/01/2016  Medication Sig Note  . Cholecalciferol (VITAMIN D) 2000 UNITS tablet Take 1 tablet by mouth daily. 10/18/2014: Received from: [facility]:   . losartan (COZAAR) 50 MG tablet Take 1 tablet (50 mg total) by mouth daily.   . rosuvastatin (CRESTOR) 5 MG tablet Take 1 tablet (5 mg total) by mouth daily.   . [DISCONTINUED] esomeprazole (NEXIUM) 40 MG capsule Take 1 capsule (40 mg total) by mouth daily.   . [DISCONTINUED] Misc Natural Products (OSTEO BI-FLEX JOINT SHIELD PO) Take 2 tablets by mouth daily.   . [DISCONTINUED] Omega-3 Fatty Acids (FISH OIL) 1200 MG CAPS Take 2 capsules by mouth 2 (two) times daily.    No facility-administered  encounter medications on file as of 04/01/2016.     Patient Care Team: Jerrol Banana., MD as PCP - General (Family Medicine)     Objective:   Vitals:  Vitals:   04/01/16 0912  BP: 128/72  Pulse: 66  Resp: 16  Temp: 98.4 F (36.9 C)  Weight: 178 lb (80.7 kg)  Height: 5' 8.75" (1.746 m)    Physical Exam  Constitutional: He is oriented to person, place, and time. He appears well-developed and well-nourished.  HENT:  Head: Normocephalic and atraumatic.  Right Ear:  External ear normal.  Left Ear: External ear normal.  Eyes: Conjunctivae are normal. Pupils are equal, round, and reactive to light.  Neck: Normal range of motion. Neck supple.  Cardiovascular: Normal rate, regular rhythm, normal heart sounds and intact distal pulses.   No murmur heard. Pulmonary/Chest: Effort normal and breath sounds normal. No respiratory distress. He has no wheezes.  Abdominal: Soft. There is no tenderness. There is no rebound.  Musculoskeletal: He exhibits no edema or tenderness.  Neurological: He is alert and oriented to person, place, and time. No cranial nerve deficit. Coordination normal.  Skin: Skin is warm and dry. No rash noted. No erythema.  Fair skin.  Psychiatric: He has a normal mood and affect. His behavior is normal. Judgment and thought content normal.     Depression Screen PHQ 2/9 Scores 12/05/2014  PHQ - 2 Score 2  PHQ- 9 Score 2      Assessment & Plan:   1. Annual physical exam - CBC w/Diff/Platelet - Lipid Panel With LDL/HDL Ratio - Comprehensive metabolic panel - TSH  2. Essential hypertension  3. Colon cancer screening Will get records from Dr Dionne Milo  4. Need for influenza vaccination - Flu Vaccine QUAD 36+ mos PF IM (Fluarix & Fluzone Quad PF)  5. Myalgia Check labs. Advised patient to decrease Crestor to 5 mg and see if symtpoms improve. Follow. - CK (Creatine Kinase) 6. ASCVD/status post carotid endarterectomy All risk factors are treated and patient has quit smoking. 7. Remote history of alcoholism Asian is not had a drink in more than 20 years. Turn to clinic 6 months  I have done the exam and reviewed the chart and it is accurate to the best of my knowledge. Miguel Aschoff M.D. [facility]  HPI, Exam and A&P transcribed under direction and in the presence of Miguel Aschoff, MD.

## 2016-04-02 LAB — CBC WITH DIFFERENTIAL/PLATELET
Basophils Absolute: 0 10*3/uL (ref 0.0–0.2)
Basos: 0 %
EOS (ABSOLUTE): 0.1 10*3/uL (ref 0.0–0.4)
Eos: 2 %
Hematocrit: 42.1 % (ref 37.5–51.0)
Hemoglobin: 14.4 g/dL (ref 12.6–17.7)
Immature Grans (Abs): 0 10*3/uL (ref 0.0–0.1)
Immature Granulocytes: 0 %
Lymphocytes Absolute: 2.8 10*3/uL (ref 0.7–3.1)
Lymphs: 36 %
MCH: 31.8 pg (ref 26.6–33.0)
MCHC: 34.2 g/dL (ref 31.5–35.7)
MCV: 93 fL (ref 79–97)
Monocytes Absolute: 0.5 10*3/uL (ref 0.1–0.9)
Monocytes: 6 %
Neutrophils Absolute: 4.4 10*3/uL (ref 1.4–7.0)
Neutrophils: 56 %
Platelets: 255 10*3/uL (ref 150–379)
RBC: 4.53 x10E6/uL (ref 4.14–5.80)
RDW: 12.7 % (ref 12.3–15.4)
WBC: 7.8 10*3/uL (ref 3.4–10.8)

## 2016-04-02 LAB — LIPID PANEL WITH LDL/HDL RATIO
Cholesterol, Total: 151 mg/dL (ref 100–199)
HDL: 48 mg/dL (ref >39)
LDL Calculated: 85 mg/dL (ref 0–99)
LDl/HDL Ratio: 1.8 ratio units (ref 0.0–3.6)
Triglycerides: 92 mg/dL (ref 0–149)
VLDL Cholesterol Cal: 18 mg/dL (ref 5–40)

## 2016-04-02 LAB — COMPREHENSIVE METABOLIC PANEL
ALT: 16 IU/L (ref 0–44)
AST: 22 IU/L (ref 0–40)
Albumin/Globulin Ratio: 1.8 (ref 1.2–2.2)
Albumin: 4.6 g/dL (ref 3.6–4.8)
Alkaline Phosphatase: 71 IU/L (ref 39–117)
BUN/Creatinine Ratio: 17 (ref 10–24)
BUN: 15 mg/dL (ref 8–27)
Bilirubin Total: 0.6 mg/dL (ref 0.0–1.2)
CO2: 28 mmol/L (ref 18–29)
Calcium: 9.5 mg/dL (ref 8.6–10.2)
Chloride: 100 mmol/L (ref 96–106)
Creatinine, Ser: 0.9 mg/dL (ref 0.76–1.27)
GFR calc Af Amer: 106 mL/min/{1.73_m2} (ref >59)
GFR calc non Af Amer: 92 mL/min/{1.73_m2} (ref >59)
Globulin, Total: 2.5 g/dL (ref 1.5–4.5)
Glucose: 96 mg/dL (ref 65–99)
Potassium: 5.2 mmol/L (ref 3.5–5.2)
Sodium: 140 mmol/L (ref 134–144)
Total Protein: 7.1 g/dL (ref 6.0–8.5)

## 2016-04-02 LAB — CK: Total CK: 123 U/L (ref 24–204)

## 2016-04-02 LAB — TSH: TSH: 1.31 u[IU]/mL (ref 0.450–4.500)

## 2016-04-06 ENCOUNTER — Other Ambulatory Visit: Payer: Self-pay

## 2016-04-06 ENCOUNTER — Telehealth: Payer: Self-pay | Admitting: Family Medicine

## 2016-04-06 MED ORDER — ROSUVASTATIN CALCIUM 5 MG PO TABS: 5.0000 mg | ORAL_TABLET | Freq: Every day | ORAL | 3 refills | Status: DC

## 2016-04-06 NOTE — Telephone Encounter (Signed)
Pt is requesting a call back.  CB#[phone]/MW

## 2016-04-26 ENCOUNTER — Other Ambulatory Visit (INDEPENDENT_AMBULATORY_CARE_PROVIDER_SITE_OTHER): Payer: Self-pay | Admitting: Vascular Surgery

## 2016-04-26 DIAGNOSIS — I6523 Occlusion and stenosis of bilateral carotid arteries: Secondary | ICD-10-CM

## 2016-04-29 DIAGNOSIS — N403 Nodular prostate with lower urinary tract symptoms: Secondary | ICD-10-CM | POA: Diagnosis not present

## 2016-04-29 DIAGNOSIS — R339 Retention of urine, unspecified: Secondary | ICD-10-CM | POA: Diagnosis not present

## 2016-04-29 DIAGNOSIS — N411 Chronic prostatitis: Secondary | ICD-10-CM | POA: Diagnosis not present

## 2016-04-29 DIAGNOSIS — N138 Other obstructive and reflux uropathy: Secondary | ICD-10-CM | POA: Diagnosis not present

## 2016-04-30 ENCOUNTER — Encounter (INDEPENDENT_AMBULATORY_CARE_PROVIDER_SITE_OTHER): Payer: Self-pay | Admitting: Vascular Surgery

## 2016-04-30 ENCOUNTER — Ambulatory Visit (INDEPENDENT_AMBULATORY_CARE_PROVIDER_SITE_OTHER): Payer: BLUE CROSS/BLUE SHIELD

## 2016-04-30 ENCOUNTER — Ambulatory Visit (INDEPENDENT_AMBULATORY_CARE_PROVIDER_SITE_OTHER): Payer: BLUE CROSS/BLUE SHIELD | Admitting: Vascular Surgery

## 2016-04-30 VITALS — BP 124/70 | HR 74 | Resp 17 | Ht 69.0 in | Wt 181.2 lb

## 2016-04-30 DIAGNOSIS — E7800 Pure hypercholesterolemia, unspecified: Secondary | ICD-10-CM | POA: Diagnosis not present

## 2016-04-30 DIAGNOSIS — I6523 Occlusion and stenosis of bilateral carotid arteries: Secondary | ICD-10-CM

## 2016-04-30 DIAGNOSIS — I10 Essential (primary) hypertension: Secondary | ICD-10-CM

## 2016-04-30 DIAGNOSIS — I6529 Occlusion and stenosis of unspecified carotid artery: Secondary | ICD-10-CM | POA: Insufficient documentation

## 2016-04-30 NOTE — Assessment & Plan Note (Signed)
blood pressure control important in reducing the progression of atherosclerotic disease. On appropriate oral medications.  

## 2016-04-30 NOTE — Progress Notes (Signed)
MRN : [id number]  Christopher Villegas is a 60 y.o. (1955-12-26) male who presents with chief complaint of  Chief Complaint  Patient presents with  . Follow-up  .  History of Present Illness: The patient returns in follow-up of his carotid artery disease. He is 8 years status post right carotid endarterectomy for high-grade stenosis. He is doing well. He has stopped smoking. He has lost weight. He is exercising regularly. He says he feels better than he has in many years. He denies focal neurologic symptoms. Specifically, the patient denies amaurosis fugax, speech or swallowing difficulties, or arm or leg weakness or numbness. His carotid duplex today demonstrates a widely patent right carotid endarterectomy and stable, mild left carotid artery stenosis in the 1-39% range.  Current Outpatient Prescriptions  Medication Sig Dispense Refill  . Cholecalciferol (VITAMIN D) 2000 UNITS tablet Take 1 tablet by mouth daily.    Marland Kitchen losartan (COZAAR) 50 MG tablet Take 1 tablet (50 mg total) by mouth daily. 30 tablet 12  . rosuvastatin (CRESTOR) 5 MG tablet Take 1 tablet (5 mg total) by mouth daily. 90 tablet 3   No current facility-administered medications for this visit.     No past medical history on file.  Past Surgical History:  Procedure Laterality Date  . CAROTID ENDARTERECTOMY    . CYST EXCISION PERINEAL    . TONSILLECTOMY AND ADENOIDECTOMY      Social History Social History  Substance Use Topics  . Smoking status: Former Smoker    Packs/day: 1.00    Years: 40.00    Types: Cigarettes    Quit date: 09/29/2014  . Smokeless tobacco: Never Used  . Alcohol use No     Family History Family History  Problem Relation Age of Onset  . Cancer Mother     breast  . Hypertension Mother   . Hyperlipidemia Mother   . Arthritis Mother     osteo  . Hypertension Father   . Heart disease Father     CHF  . Kidney failure Father     causing death  . Autoimmune disease Sister   . Lupus Sister     . Healthy Son   . Healthy Son      Allergies  Allergen Reactions  . Plavix [Clopidogrel] Rash     REVIEW OF SYSTEMS (Negative unless checked)  Constitutional: [] Weight loss  [] Fever  [] Chills Cardiac: [] Chest pain   [] Chest pressure   [] Palpitations   [] Shortness of breath when laying flat   [] Shortness of breath at rest   [] Shortness of breath with exertion. Vascular:  [] Pain in legs with walking   [] Pain in legs at rest   [] Pain in legs when laying flat   [] Claudication   [] Pain in feet when walking  [] Pain in feet at rest  [] Pain in feet when laying flat   [] History of DVT   [] Phlebitis   [] Swelling in legs   [] Varicose veins   [] Non-healing ulcers Pulmonary:   [] Uses home oxygen   [] Productive cough   [] Hemoptysis   [] Wheeze  [] COPD   [] Asthma Neurologic:  [] Dizziness  [] Blackouts   [] Seizures   [] History of stroke   [] History of TIA  [] Aphasia   [] Temporary blindness   [] Dysphagia   [] Weakness or numbness in arms   [] Weakness or numbness in legs Musculoskeletal:  [] Arthritis   [] Joint swelling   [] Joint pain   [] Low back pain Hematologic:  [] Easy bruising  [] Easy bleeding   [] Hypercoagulable state   []   Anemic  [] Hepatitis Gastrointestinal:  [] Blood in stool   [] Vomiting blood  [] Gastroesophageal reflux/heartburn   [] Difficulty swallowing. Genitourinary:  [] Chronic kidney disease   [] Difficult urination  [] Frequent urination  [] Burning with urination   [] Blood in urine Skin:  [] Rashes   [] Ulcers   [] Wounds Psychological:  [] History of anxiety   []  History of major depression.  Physical Examination  Vitals:   04/30/16 1024 04/30/16 1025  BP: 129/70 124/70  Pulse: 74   Resp: 17   Weight: 181 lb 3.2 oz (82.2 kg)   Height: 5\' 9"  (1.753 m)    Body mass index is 26.76 kg/m. Gen:  WD/WN, NAD Head: [place]/AT, No temporalis wasting. Ear/Nose/Throat: Hearing grossly intact, nares w/o erythema or drainage, trachea midline Eyes: Conjunctiva clear. Sclera non-icteric Neck: Supple.  No   JVD.  Pulmonary:  Good air movement, equal and clear to auscultation bilaterally.  Cardiac: RRR, normal S1, S2, no Murmurs, rubs or gallops. Vascular: Vessel Right Left  Radial Palpable Palpable  Ulnar Palpable Palpable  Brachial Palpable Palpable  Carotid Palpable, with bruit Palpable, without bruit  Aorta Not palpable N/A                   Gastrointestinal: soft, non-tender/non-distended. No guarding/reflex.  Musculoskeletal: M/S 5/5 throughout.  No deformity or atrophy.  Neurologic: CN 2-12 intact. Sensation grossly intact in extremities.  Symmetrical.  Speech is fluent. Motor exam as listed above. Psychiatric: Judgment intact, Mood & affect appropriate for pt's clinical situation. Dermatologic: No rashes or ulcers noted.  No cellulitis or open wounds. Lymph : No Cervical, Axillary, or Inguinal lymphadenopathy.     CBC Lab Results  Component Value Date   WBC 7.8 04/01/2016   HGB 14.3 07/14/2012   HCT 42.1 04/01/2016   MCV 93 04/01/2016   PLT 255 04/01/2016    BMET    Component Value Date/Time   NA 140 04/01/2016 1025   K 5.2 04/01/2016 1025   CL 100 04/01/2016 1025   CO2 28 04/01/2016 1025   GLUCOSE 96 04/01/2016 1025   BUN 15 04/01/2016 1025   CREATININE 0.90 04/01/2016 1025   CALCIUM 9.5 04/01/2016 1025   GFRNONAA 92 04/01/2016 1025   GFRAA 106 04/01/2016 1025   CrCl cannot be calculated (Patient's most recent lab result is older than the maximum 21 days allowed.).  COAG No results found for: INR, PROTIME  Radiology No results found.  Assessment/Plan Hypercholesteremia lipid control important in reducing the progression of atherosclerotic disease. Continue statin therapy   BP (high blood pressure) blood pressure control important in reducing the progression of atherosclerotic disease. On appropriate oral medications.   Carotid stenosis The patient's carotid duplex demonstrates a widely patent right CEA and stable, mild left carotid artery  stenosis in the 1-39% range. They're doing well without any focal neurologic symptoms. They will continue appropriate medical management with aspirin and a statin agent. We will plan to see them back in 12 months with follow-up duplex. They will contact our office with any problems or focal neurologic deficits in the interim.     Leotis Pain, MD  04/30/2016 1:02 PM    This note was created with Dragon medical transcription system.  Any errors from dictation are purely unintentional

## 2016-04-30 NOTE — Assessment & Plan Note (Signed)
The patient's carotid duplex demonstrates a widely patent right CEA and stable, mild left carotid artery stenosis in the 1-39% range. They're doing well without any focal neurologic symptoms. They will continue appropriate medical management with aspirin and a statin agent. We will plan to see them back in 12 months with follow-up duplex. They will contact our office with any problems or focal neurologic deficits in the interim.

## 2016-04-30 NOTE — Assessment & Plan Note (Signed)
lipid control important in reducing the progression of atherosclerotic disease. Continue statin therapy  

## 2016-07-05 DIAGNOSIS — I6523 Occlusion and stenosis of bilateral carotid arteries: Secondary | ICD-10-CM | POA: Diagnosis not present

## 2016-07-05 DIAGNOSIS — I10 Essential (primary) hypertension: Secondary | ICD-10-CM | POA: Diagnosis not present

## 2016-07-05 DIAGNOSIS — E782 Mixed hyperlipidemia: Secondary | ICD-10-CM | POA: Diagnosis not present

## 2016-07-07 ENCOUNTER — Other Ambulatory Visit: Payer: Self-pay | Admitting: Emergency Medicine

## 2016-07-07 MED ORDER — OSELTAMIVIR PHOSPHATE 75 MG PO CAPS: 75.0000 mg | ORAL_CAPSULE | Freq: Two times a day (BID) | ORAL | 0 refills | Status: DC

## 2016-07-15 DIAGNOSIS — L821 Other seborrheic keratosis: Secondary | ICD-10-CM | POA: Diagnosis not present

## 2016-07-15 DIAGNOSIS — Z85828 Personal history of other malignant neoplasm of skin: Secondary | ICD-10-CM | POA: Diagnosis not present

## 2016-07-15 DIAGNOSIS — L570 Actinic keratosis: Secondary | ICD-10-CM | POA: Diagnosis not present

## 2016-09-06 ENCOUNTER — Other Ambulatory Visit: Payer: Self-pay | Admitting: Family Medicine

## 2016-09-06 DIAGNOSIS — I10 Essential (primary) hypertension: Secondary | ICD-10-CM

## 2016-09-30 ENCOUNTER — Ambulatory Visit (INDEPENDENT_AMBULATORY_CARE_PROVIDER_SITE_OTHER): Payer: BLUE CROSS/BLUE SHIELD | Admitting: Family Medicine

## 2016-09-30 ENCOUNTER — Encounter: Payer: Self-pay | Admitting: Family Medicine

## 2016-09-30 VITALS — BP 110/62 | HR 72 | Temp 97.6°F | Resp 16 | Wt 190.0 lb

## 2016-09-30 DIAGNOSIS — I10 Essential (primary) hypertension: Secondary | ICD-10-CM

## 2016-09-30 DIAGNOSIS — E7800 Pure hypercholesterolemia, unspecified: Secondary | ICD-10-CM | POA: Diagnosis not present

## 2016-09-30 NOTE — Progress Notes (Signed)
Patient: Christopher Villegas Male    DOB: [birth year]   61 y.o.   MRN: [id number] Visit Date: 09/30/2016  Today's Provider: Wilhemena Durie, MD   Chief Complaint  Patient presents with  . Hypertension  . Hyperlipidemia   Subjective:    HPI      Hypertension, follow-up:  BP Readings from Last 3 Encounters:  09/30/16 110/62  04/30/16 124/70  04/01/16 128/72    He was last seen for hypertension 6 months ago.  BP at that visit was 128/72. Management since that visit includes none. He reports excellent compliance with treatment. He is not having side effects.  He is exercising. 5 days per week. Walks for uses elliptical for 30 minutes. He is adherent to low salt diet.   Outside blood pressures are not being checked. He is experiencing none.  Patient denies chest pain, chest pressure/discomfort, claudication, dyspnea, exertional chest pressure/discomfort, fatigue, irregular heart beat, lower extremity edema, near-syncope, orthopnea, palpitations and syncope.   Cardiovascular risk factors include advanced age (older than 10 for men, 9 for women), dyslipidemia, hypertension and male gender.    Weight trend: increasing steadily Wt Readings from Last 3 Encounters:  09/30/16 190 lb (86.2 kg)  04/30/16 181 lb 3.2 oz (82.2 kg)  04/01/16 178 lb (80.7 kg)    Current diet: in general, a "healthy" diet    ------------------------------------------------------------------------  Lipid/Cholesterol, Follow-up:   Last seen for this 6 months ago.  Management changes since that visit include decreasing Crestor to 5 mg due to myalgias. Pt reports his myalgias are improved, but he is concerned because he notices muscle weakness. Pt is requesting labs to check CK. . Last Lipid Panel:    Component Value Date/Time   CHOL 151 04/01/2016 1025   TRIG 92 04/01/2016 1025   HDL 48 04/01/2016 1025   LDLCALC 85 04/01/2016 1025       -------------------------------------------------------------------   Allergies  Allergen Reactions  . Plavix [Clopidogrel] Rash     Current Outpatient Prescriptions:  .  Cholecalciferol (VITAMIN D) 2000 UNITS tablet, Take 1 tablet by mouth daily., Disp: , Rfl:  .  losartan (COZAAR) 50 MG tablet, TAKE 1 TABLET EVERY DAY, Disp: 30 tablet, Rfl: 6 .  rosuvastatin (CRESTOR) 5 MG tablet, Take 1 tablet (5 mg total) by mouth daily., Disp: 90 tablet, Rfl: 3  Review of Systems  Constitutional: Negative for activity change, appetite change, chills, diaphoresis, fatigue, fever and unexpected weight change.  Respiratory: Negative for shortness of breath.   Cardiovascular: Negative for chest pain, palpitations and leg swelling.  Neurological: Positive for weakness.    Social History  Substance Use Topics  . Smoking status: Former Smoker    Packs/day: 1.00    Years: 40.00    Types: Cigarettes    Quit date: 09/29/2014  . Smokeless tobacco: Never Used  . Alcohol use No   Objective:   BP 110/62 (BP Location: Right Arm, Patient Position: Sitting, Cuff Size: Large)   Pulse 72   Temp 97.6 F (36.4 C) (Oral)   Resp 16   Wt 190 lb (86.2 kg)   BMI 28.06 kg/m  Vitals:   09/30/16 0811  BP: 110/62  Pulse: 72  Resp: 16  Temp: 97.6 F (36.4 C)  TempSrc: Oral  Weight: 190 lb (86.2 kg)     Physical Exam  Constitutional: He appears well-developed and well-nourished.  HENT:  Head: Normocephalic.  Eyes: Conjunctivae are normal.  Neck: Normal range of motion.  Cardiovascular: Normal rate, regular rhythm and normal heart sounds.   Pulmonary/Chest: Effort normal and breath sounds normal. No respiratory distress.  Psychiatric: He has a normal mood and affect. His behavior is normal.        Assessment & Plan:     1. Essential hypertension Stable. Continue current medications.  2. Hypercholesteremia Check labs including CK for muscle weakness. Pt states he has noticed gradual  strength decrease over the last couple years. Suspect this is due to age. FU pending results. - CK - Lipid panel - Hepatic function panel    Patient seen and examined by Miguel Aschoff, MD, and note scribed by Renaldo Fiddler, CMA. I have done the exam and reviewed the above chart and it is accurate to the best of my knowledge. Development worker, community has been used in this note in any air is in the dictation or transcription are unintentional.  Wilhemena Durie, MD  [facility]

## 2016-10-04 DIAGNOSIS — E7800 Pure hypercholesterolemia, unspecified: Secondary | ICD-10-CM | POA: Diagnosis not present

## 2016-10-05 LAB — LIPID PANEL
Chol/HDL Ratio: 3.2 ratio (ref 0.0–5.0)
Cholesterol, Total: 161 mg/dL (ref 100–199)
HDL: 50 mg/dL (ref >39)
LDL Calculated: 96 mg/dL (ref 0–99)
Triglycerides: 75 mg/dL (ref 0–149)
VLDL Cholesterol Cal: 15 mg/dL (ref 5–40)

## 2016-10-05 LAB — HEPATIC FUNCTION PANEL
ALT: 20 IU/L (ref 0–44)
AST: 23 IU/L (ref 0–40)
Albumin: 4.4 g/dL (ref 3.6–4.8)
Alkaline Phosphatase: 57 IU/L (ref 39–117)
Bilirubin Total: 0.4 mg/dL (ref 0.0–1.2)
Bilirubin, Direct: 0.13 mg/dL (ref 0.00–0.40)
Total Protein: 7 g/dL (ref 6.0–8.5)

## 2016-10-05 LAB — CK: Total CK: 177 U/L (ref 24–204)

## 2016-10-06 ENCOUNTER — Telehealth: Payer: Self-pay | Admitting: Family Medicine

## 2016-10-06 NOTE — Telephone Encounter (Signed)
Pt would like to talk to someone about his lab results.  He has seen them on My chart and has a question.  His call back is [phone]  Thanks Con Memos

## 2016-10-07 NOTE — Telephone Encounter (Signed)
Patient said he never heard from his labs.  He looked them up on My Chart and although the CK is in the normal range he is concerned because it has gone up so much in 6 months.  He is on Crestor and is having some cramping.  He is worried if there could be permanent damage to the muscle if the CK continues to go up.  Is there anything he should be doing differently/proactively.

## 2016-10-07 NOTE — Telephone Encounter (Signed)
That CK is OK--not dangerous. If he is having a lot of cramping can try every other day Crestor.

## 2016-10-07 NOTE — Telephone Encounter (Signed)
Advised  ED 

## 2016-11-01 ENCOUNTER — Other Ambulatory Visit: Payer: Self-pay | Admitting: Family Medicine

## 2016-11-01 DIAGNOSIS — K219 Gastro-esophageal reflux disease without esophagitis: Secondary | ICD-10-CM

## 2016-11-01 MED ORDER — ESOMEPRAZOLE MAGNESIUM 40 MG PO CPDR: 40.0000 mg | DELAYED_RELEASE_CAPSULE | Freq: Every day | ORAL | 3 refills | Status: DC

## 2016-11-01 NOTE — Telephone Encounter (Signed)
Pt needs refill or new rx nexium 40 mg  Generic.  heartburn has returned  [employer]

## 2016-11-01 NOTE — Telephone Encounter (Signed)
1year rf. 

## 2016-11-01 NOTE — Telephone Encounter (Signed)
Ok to do this?-aa

## 2016-11-01 NOTE — Telephone Encounter (Signed)
Done-aa 

## 2016-12-02 DIAGNOSIS — M9902 Segmental and somatic dysfunction of thoracic region: Secondary | ICD-10-CM | POA: Diagnosis not present

## 2016-12-02 DIAGNOSIS — M5441 Lumbago with sciatica, right side: Secondary | ICD-10-CM | POA: Diagnosis not present

## 2016-12-06 DIAGNOSIS — M5441 Lumbago with sciatica, right side: Secondary | ICD-10-CM | POA: Diagnosis not present

## 2016-12-06 DIAGNOSIS — M9902 Segmental and somatic dysfunction of thoracic region: Secondary | ICD-10-CM | POA: Diagnosis not present

## 2016-12-07 DIAGNOSIS — L821 Other seborrheic keratosis: Secondary | ICD-10-CM | POA: Diagnosis not present

## 2016-12-07 DIAGNOSIS — Z85828 Personal history of other malignant neoplasm of skin: Secondary | ICD-10-CM | POA: Diagnosis not present

## 2016-12-07 DIAGNOSIS — L570 Actinic keratosis: Secondary | ICD-10-CM | POA: Diagnosis not present

## 2016-12-07 DIAGNOSIS — D485 Neoplasm of uncertain behavior of skin: Secondary | ICD-10-CM | POA: Diagnosis not present

## 2016-12-14 ENCOUNTER — Other Ambulatory Visit: Payer: Self-pay

## 2016-12-14 DIAGNOSIS — M791 Myalgia, unspecified site: Secondary | ICD-10-CM

## 2016-12-14 DIAGNOSIS — E7800 Pure hypercholesterolemia, unspecified: Secondary | ICD-10-CM

## 2016-12-17 ENCOUNTER — Other Ambulatory Visit: Payer: Self-pay | Admitting: Family Medicine

## 2016-12-17 DIAGNOSIS — E7800 Pure hypercholesterolemia, unspecified: Secondary | ICD-10-CM | POA: Diagnosis not present

## 2016-12-17 DIAGNOSIS — M791 Myalgia: Secondary | ICD-10-CM | POA: Diagnosis not present

## 2016-12-18 LAB — LIPID PANEL WITH LDL/HDL RATIO
Cholesterol, Total: 190 mg/dL (ref 100–199)
HDL: 49 mg/dL (ref >39)
LDL Calculated: 116 mg/dL — ABNORMAL HIGH (ref 0–99)
LDl/HDL Ratio: 2.4 ratio (ref 0.0–3.6)
Triglycerides: 126 mg/dL (ref 0–149)
VLDL Cholesterol Cal: 25 mg/dL (ref 5–40)

## 2016-12-18 LAB — CK: Total CK: 225 U/L — ABNORMAL HIGH (ref 24–204)

## 2016-12-20 DIAGNOSIS — M9902 Segmental and somatic dysfunction of thoracic region: Secondary | ICD-10-CM | POA: Diagnosis not present

## 2016-12-20 DIAGNOSIS — M5441 Lumbago with sciatica, right side: Secondary | ICD-10-CM | POA: Diagnosis not present

## 2016-12-23 ENCOUNTER — Telehealth: Payer: Self-pay | Admitting: Family Medicine

## 2016-12-23 NOTE — Telephone Encounter (Signed)
Pt is requesting lab results.  CB#[phone]/MW

## 2016-12-24 ENCOUNTER — Other Ambulatory Visit: Payer: Self-pay

## 2017-04-02 ENCOUNTER — Other Ambulatory Visit: Payer: Self-pay | Admitting: Family Medicine

## 2017-04-02 DIAGNOSIS — I10 Essential (primary) hypertension: Secondary | ICD-10-CM

## 2017-04-06 ENCOUNTER — Telehealth: Payer: Self-pay | Admitting: *Deleted

## 2017-04-06 ENCOUNTER — Ambulatory Visit (INDEPENDENT_AMBULATORY_CARE_PROVIDER_SITE_OTHER): Payer: BLUE CROSS/BLUE SHIELD | Admitting: Family Medicine

## 2017-04-06 VITALS — BP 132/84 | HR 72 | Temp 97.7°F | Resp 16 | Ht 69.0 in | Wt 204.0 lb

## 2017-04-06 DIAGNOSIS — M791 Myalgia, unspecified site: Secondary | ICD-10-CM

## 2017-04-06 DIAGNOSIS — Z87891 Personal history of nicotine dependence: Secondary | ICD-10-CM

## 2017-04-06 DIAGNOSIS — Z122 Encounter for screening for malignant neoplasm of respiratory organs: Secondary | ICD-10-CM | POA: Diagnosis not present

## 2017-04-06 DIAGNOSIS — Z125 Encounter for screening for malignant neoplasm of prostate: Secondary | ICD-10-CM | POA: Diagnosis not present

## 2017-04-06 DIAGNOSIS — Z1159 Encounter for screening for other viral diseases: Secondary | ICD-10-CM | POA: Diagnosis not present

## 2017-04-06 DIAGNOSIS — K219 Gastro-esophageal reflux disease without esophagitis: Secondary | ICD-10-CM | POA: Diagnosis not present

## 2017-04-06 DIAGNOSIS — R739 Hyperglycemia, unspecified: Secondary | ICD-10-CM

## 2017-04-06 DIAGNOSIS — Z1211 Encounter for screening for malignant neoplasm of colon: Secondary | ICD-10-CM

## 2017-04-06 DIAGNOSIS — Z0000 Encounter for general adult medical examination without abnormal findings: Secondary | ICD-10-CM

## 2017-04-06 NOTE — Progress Notes (Signed)
Patient: Christopher Villegas, Male    DOB: 1956-05-21, 61 y.o.   MRN: [id number] Visit Date: 04/06/2017  Today's Provider: Wilhemena Durie, MD   Chief Complaint  Patient presents with  . Annual Exam   Subjective:  [name] is a 61 y.o. male who presents today for health maintenance and complete physical. He feels 5 days weekly. He reports exercising. He reports he is sleeping well.  The patient continues with chest pain with exercise.  He has a history of GERD and is on Nexium.  He was referred to cardiology for this and had negative work up last year.  He would like referral to GI for possible endoscopy.  He states that when he gets this pain belching will make it better.    Patient also complains that his skin tears easily and wanted to know if there is anything he should do for this.    Patient would like to have the CK rechecked.  He was taken off of statin because of myalgia and elevated CK.  He states it has not been checked since coming off of the medicine and would like for it to be done.    Discussed with the patient vaccines.  He has had his Flu shot at work 2 weeks ago and will plan to have Shingles and Pneumonia in the near future.    Patient has not had Hepatitis C screening done and this will be ordered today.      Immunization History  Administered Date(s) Administered  . Influenza,inj,Quad PF,6+ Mos 04/01/2016  . Tdap 03/08/2008  . Zoster 04/23/2013   04/16/08 Colonoscopy-tubular adenoma  Review of Systems  Constitutional: Negative.   HENT: Negative.   Eyes: Negative.   Respiratory: Negative.   Cardiovascular: Negative.   Gastrointestinal: Negative.   Endocrine: Negative.   Genitourinary: Negative.   Musculoskeletal: Negative.   Skin: Negative.   Allergic/Immunologic: Negative.   Neurological: Negative.   Hematological: Negative.   Psychiatric/Behavioral: Negative.     Social History   Social History  . Marital status: Married    Spouse name: Corporate treasurer  .  Number of children: 2  . Years of education: 16   Occupational History  . [employer]    Social History Main Topics  . Smoking status: Former Smoker    Packs/day: 1.00    Years: 40.00    Types: Cigarettes    Quit date: 09/29/2014  . Smokeless tobacco: Never Used  . Alcohol use No  . Drug use: No  . Sexual activity: Yes   Other Topics Concern  . Not on file   Social History Narrative  . No narrative on file    Patient Active Problem List   Diagnosis Date Noted  . Carotid stenosis 04/30/2016  . Hyperglycemia 08/14/2015  . Gastroesophageal reflux disease without esophagitis 08/14/2015  . Depression 08/14/2015  . BP (high blood pressure) 10/18/2014  . 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 H/O neoplasm 10/08/2014  . Arthritis of knee, degenerative 05/08/2014  . Chronic prostatitis 10/12/2012  . Benign prostatic hyperplasia with urinary obstruction 10/12/2012  . Atherosclerosis of coronary artery 08/14/2009  . Avitaminosis D 05/22/2009  . Hypercholesteremia 03/08/2008  . Actinic keratoses 03/08/2008  . Alcohol use 01/04/2008    Past Surgical History:  Procedure Laterality Date  . CAROTID ENDARTERECTOMY    . CYST EXCISION PERINEAL    . TONSILLECTOMY AND ADENOIDECTOMY      His family history includes Arthritis in his  mother; Autoimmune disease in his sister; Cancer in his mother; Healthy in his son and son; Heart disease in his father; Hyperlipidemia in his mother; Hypertension in his father and mother; Kidney failure in his father; Lupus in his sister.     Outpatient Encounter Prescriptions as of 04/06/2017  Medication Sig Note  . Cholecalciferol (VITAMIN D) 2000 UNITS tablet Take 1 tablet by mouth daily. 10/18/2014: Received from: [facility]:   . esomeprazole (NEXIUM) 40 MG capsule Take 1 capsule (40 mg total) by mouth daily.   Marland Kitchen losartan (COZAAR) 50 MG tablet TAKE 1 TABLET BY MOUTH DAILY    No  facility-administered encounter medications on file as of 04/06/2017.     Patient Care Team: Jerrol Banana., MD as PCP - General (Family Medicine)      Objective:   Vitals:  Vitals:   04/06/17 0900  BP: 132/84  Pulse: 72  Resp: 16  Temp: 97.7 F (36.5 C)  TempSrc: Oral  Weight: 204 lb (92.5 kg)  Height: 5\' 9"  (1.753 m)    Physical Exam  Constitutional: He is oriented to person, place, and time. He appears well-developed and well-nourished.  HENT:  Head: Normocephalic and atraumatic.  Right Ear: External ear normal.  Left Ear: External ear normal.  Nose: Nose normal.  Mouth/Throat: Oropharynx is clear and moist.  Eyes: Pupils are equal, round, and reactive to light. Conjunctivae and EOM are normal.  Neck: Normal range of motion. Neck supple.  Cardiovascular: Normal rate, regular rhythm, normal heart sounds and intact distal pulses.   Pulmonary/Chest: Effort normal and breath sounds normal.  Abdominal: Soft. Bowel sounds are normal.  Genitourinary: Rectum normal, prostate normal and penis normal.  Musculoskeletal: Normal range of motion.  Neurological: He is alert and oriented to person, place, and time.  Skin: Skin is warm and dry.  Psychiatric: He has a normal mood and affect. Judgment and thought content normal.   Fall Risk  04/06/2017 12/05/2014  Falls in the past year? No No     Depression Screen PHQ 2/9 Scores 04/06/2017 12/05/2014  PHQ - 2 Score 0 2  PHQ- 9 Score 0 2    Current Exercise Habits: Home exercise routine, Frequency (Times/Week): 5    Functional Status Survey: Is the patient deaf or have difficulty hearing?: No Does the patient have difficulty seeing, even when wearing glasses/contacts?: No Does the patient have difficulty concentrating, remembering, or making decisions?: No Does the patient have difficulty walking or climbing stairs?: No Does the patient have difficulty dressing or bathing?: No Does the patient have difficulty doing  errands alone such as visiting a doctor's office or shopping?: No   Assessment & Plan:     Routine Health Maintenance and Physical Exam  Exercise Activities and Dietary recommendations Goals    None      Immunization History  Administered Date(s) Administered  . Influenza,inj,Quad PF,6+ Mos 04/01/2016  . Tdap 03/08/2008  . Zoster 04/23/2013    Health Maintenance  Topic Date Due  . Hepatitis C Screening  1955-09-05  . HIV Screening  04/30/1969  . INFLUENZA VACCINE  01/12/2017  . TETANUS/TDAP  03/08/2018  . COLONOSCOPY  04/16/2018    Refer to GI for screening colonoscopy. Discussed health benefits of physical activity, and encouraged him to engage in regular exercise appropriate for his age and condition.  Tobacco Abuse Pt quit 2016 but has >60 pack year history--order low dose Chest CT.   I have done the exam and  reviewed the chart and it is accurate to the best of my knowledge. Development worker, community has been used and  any errors in dictation or transcription are unintentional. Miguel Aschoff M.D. [facility]

## 2017-04-06 NOTE — Telephone Encounter (Signed)
Received referral for initial lung cancer screening scan. Contacted patient and obtained smoking history,(former, quit 09/29/14, 45 pack year) as well as answering questions related to screening process. Patient denies signs of lung cancer such as weight loss or hemoptysis. Patient denies comorbidity that would prevent curative treatment if lung cancer were found. Patient is scheduled for shared decision making visit and CT scan on 04/13/17.

## 2017-04-07 ENCOUNTER — Encounter: Payer: BLUE CROSS/BLUE SHIELD | Admitting: Family Medicine

## 2017-04-07 LAB — CBC WITH DIFFERENTIAL/PLATELET
Basophils Absolute: 46 cells/uL (ref 0–200)
Basophils Relative: 0.6 %
Eosinophils Absolute: 231 cells/uL (ref 15–500)
Eosinophils Relative: 3 %
HCT: 42.3 % (ref 38.5–50.0)
Hemoglobin: 14.6 g/dL (ref 13.2–17.1)
Lymphs Abs: 2325 cells/uL (ref 850–3900)
MCH: 30.7 pg (ref 27.0–33.0)
MCHC: 34.5 g/dL (ref 32.0–36.0)
MCV: 89.1 fL (ref 80.0–100.0)
MPV: 9.2 fL (ref 7.5–12.5)
Monocytes Relative: 6.7 %
Neutro Abs: 4582 cells/uL (ref 1500–7800)
Neutrophils Relative %: 59.5 %
Platelets: 254 10*3/uL (ref 140–400)
RBC: 4.75 10*6/uL (ref 4.20–5.80)
RDW: 12.3 % (ref 11.0–15.0)
Total Lymphocyte: 30.2 %
WBC mixed population: 516 cells/uL (ref 200–950)
WBC: 7.7 10*3/uL (ref 3.8–10.8)

## 2017-04-07 LAB — COMPLETE METABOLIC PANEL WITH GFR
AG Ratio: 1.6 (calc) (ref 1.0–2.5)
ALT: 22 U/L (ref 9–46)
AST: 24 U/L (ref 10–35)
Albumin: 4.5 g/dL (ref 3.6–5.1)
Alkaline phosphatase (APISO): 54 U/L (ref 40–115)
BUN/Creatinine Ratio: 20 (calc) (ref 6–22)
BUN: 27 mg/dL — ABNORMAL HIGH (ref 7–25)
CO2: 26 mmol/L (ref 20–32)
Calcium: 9.3 mg/dL (ref 8.6–10.3)
Chloride: 103 mmol/L (ref 98–110)
Creat: 1.32 mg/dL — ABNORMAL HIGH (ref 0.70–1.25)
GFR, Est African American: 67 mL/min/{1.73_m2} (ref >=60)
GFR, Est Non African American: 57 mL/min/{1.73_m2} — ABNORMAL LOW (ref >=60)
Globulin: 2.8 g/dL (calc) (ref 1.9–3.7)
Glucose, Bld: 94 mg/dL (ref 65–99)
Potassium: 4.3 mmol/L (ref 3.5–5.3)
Sodium: 137 mmol/L (ref 135–146)
Total Bilirubin: 0.5 mg/dL (ref 0.2–1.2)
Total Protein: 7.3 g/dL (ref 6.1–8.1)

## 2017-04-07 LAB — TSH: TSH: 0.86 mIU/L (ref 0.40–4.50)

## 2017-04-07 LAB — HEMOGLOBIN A1C
Hgb A1c MFr Bld: 5.4 % of total Hgb (ref <5.7)
Mean Plasma Glucose: 108 (calc)
eAG (mmol/L): 6 (calc)

## 2017-04-07 LAB — LIPID PANEL
Cholesterol: 230 mg/dL — ABNORMAL HIGH (ref <200)
HDL: 55 mg/dL (ref >40)
LDL Cholesterol (Calc): 153 mg/dL (calc) — ABNORMAL HIGH
Non-HDL Cholesterol (Calc): 175 mg/dL (calc) — ABNORMAL HIGH (ref <130)
Total CHOL/HDL Ratio: 4.2 (calc) (ref <5.0)
Triglycerides: 103 mg/dL (ref <150)

## 2017-04-07 LAB — PSA: PSA: 0.7 ng/mL (ref <=4.0)

## 2017-04-07 LAB — HEPATITIS C ANTIBODY
Hepatitis C Ab: NONREACTIVE
SIGNAL TO CUT-OFF: 0.02 (ref <1.00)

## 2017-04-07 LAB — CK: Total CK: 166 U/L (ref 44–196)

## 2017-04-12 ENCOUNTER — Telehealth: Payer: Self-pay

## 2017-04-12 ENCOUNTER — Telehealth: Payer: Self-pay | Admitting: Family Medicine

## 2017-04-12 MED ORDER — EZETIMIBE 10 MG PO TABS: 10.0000 mg | ORAL_TABLET | Freq: Every day | ORAL | 0 refills | Status: DC

## 2017-04-12 NOTE — Telephone Encounter (Signed)
lmtcb

## 2017-04-12 NOTE — Telephone Encounter (Signed)
Pt advised. He agrees to start medication. Due to difficulty finding appointment slot to FU in one month, pt requested to come in for labs only. Pt works in [place] on [address], which makes it more difficult to find appointment slot for fasting labs. Advised pt to come in for labs only in about 4 weeks.

## 2017-04-12 NOTE — Telephone Encounter (Signed)
Pt is requesting Christopher Villegas return his call to get his lab results. Please advise. Thanks TNP

## 2017-04-12 NOTE — Telephone Encounter (Signed)
-----   Message from Jerrol Banana., MD sent at 04/12/2017 11:22 AM EDT ----- Labs okay. For cholesterol would  try Zetia 10 mg daily-- lipids in 1 month

## 2017-04-12 NOTE — Telephone Encounter (Signed)
Pt has already been advised of results.

## 2017-04-13 ENCOUNTER — Telehealth: Payer: Self-pay | Admitting: Family Medicine

## 2017-04-13 ENCOUNTER — Inpatient Hospital Stay: Payer: BLUE CROSS/BLUE SHIELD | Attending: Nurse Practitioner | Admitting: Nurse Practitioner

## 2017-04-13 ENCOUNTER — Ambulatory Visit
Admission: RE | Admit: 2017-04-13 | Discharge: 2017-04-13 | Disposition: A | Discharge status: Home / Self Care | Payer: BLUE CROSS/BLUE SHIELD | Source: Ambulatory Visit | Attending: Nurse Practitioner | Admitting: Nurse Practitioner

## 2017-04-13 DIAGNOSIS — Z87891 Personal history of nicotine dependence: Secondary | ICD-10-CM

## 2017-04-13 DIAGNOSIS — Z122 Encounter for screening for malignant neoplasm of respiratory organs: Secondary | ICD-10-CM

## 2017-04-13 NOTE — Progress Notes (Signed)
In accordance with CMS guidelines, patient has met eligibility criteria including age, absence of signs or symptoms of lung cancer.  Social History  Substance Use Topics  . Smoking status: Former Smoker    Packs/day: 1.00    Years: 40.00    Types: Cigarettes    Quit date: 09/29/2014  . Smokeless tobacco: Never Used  . Alcohol use No     A shared decision-making session was conducted prior to the performance of CT scan. This includes one or more decision aids, includes benefits and harms of screening, follow-up diagnostic testing, over-diagnosis, false positive rate, and total radiation exposure.  Counseling on the importance of adherence to annual lung cancer LDCT screening, impact of co-morbidities, and ability or willingness to undergo diagnosis and treatment is imperative for compliance of the program.  Counseling on the importance of continued smoking cessation for former smokers; the importance of smoking cessation for current smokers, and information about tobacco cessation interventions have been given to patient including [place] and 1800 quit [place] programs.  Written order for lung cancer screening with LDCT has been given to the patient and any and all questions have been answered to the best of my abilities.   Yearly follow up will be coordinated by Burgess Estelle, Thoracic Navigator.  Verlon Au NP 04/13/17 4:05 PM

## 2017-04-13 NOTE — Telephone Encounter (Signed)
Patient wanted clarification on his labs.

## 2017-04-13 NOTE — Telephone Encounter (Signed)
Pt is requesting to speak with [facility]. Pt stated that he spoke with Raquel Sarna yesterday 04/12/17 about his results but he has a question about his results that he would like to go over with [place]. Pt wouldn't give more details. Please advise. Thanks TNP

## 2017-04-15 ENCOUNTER — Telehealth: Payer: Self-pay | Admitting: *Deleted

## 2017-04-15 NOTE — Telephone Encounter (Signed)
Notified patient of LDCT lung cancer screening program results with recommendation for 12 month follow up imaging. Also notified of incidental findings noted below and is encouraged to discuss further with PCP who will receive a copy of this note and/or the CT report. Patient verbalizes understanding.   IMPRESSION: 1. Lung-RADS 2-S, benign appearance or behavior. Continue annual screening with low-dose chest CT without contrast in 12 months. 2. The "S" modifier above refers to potentially clinically significant non lung cancer related findings. Specifically, two-vessel coronary atherosclerosis.  Aortic Atherosclerosis (ICD10-I70.0) and Emphysema (ICD10-J43.9).

## 2017-05-03 ENCOUNTER — Encounter (INDEPENDENT_AMBULATORY_CARE_PROVIDER_SITE_OTHER): Payer: BLUE CROSS/BLUE SHIELD

## 2017-05-03 ENCOUNTER — Ambulatory Visit (INDEPENDENT_AMBULATORY_CARE_PROVIDER_SITE_OTHER): Payer: BLUE CROSS/BLUE SHIELD | Admitting: Vascular Surgery

## 2017-05-11 ENCOUNTER — Ambulatory Visit (INDEPENDENT_AMBULATORY_CARE_PROVIDER_SITE_OTHER): Payer: BLUE CROSS/BLUE SHIELD

## 2017-05-11 ENCOUNTER — Ambulatory Visit (INDEPENDENT_AMBULATORY_CARE_PROVIDER_SITE_OTHER): Payer: BLUE CROSS/BLUE SHIELD | Admitting: Vascular Surgery

## 2017-05-11 ENCOUNTER — Encounter (INDEPENDENT_AMBULATORY_CARE_PROVIDER_SITE_OTHER): Payer: Self-pay | Admitting: Vascular Surgery

## 2017-05-11 VITALS — BP 137/76 | HR 75 | Resp 16 | Ht 69.0 in | Wt 208.0 lb

## 2017-05-11 DIAGNOSIS — R739 Hyperglycemia, unspecified: Secondary | ICD-10-CM | POA: Diagnosis not present

## 2017-05-11 DIAGNOSIS — I6523 Occlusion and stenosis of bilateral carotid arteries: Secondary | ICD-10-CM

## 2017-05-11 DIAGNOSIS — E7800 Pure hypercholesterolemia, unspecified: Secondary | ICD-10-CM | POA: Diagnosis not present

## 2017-05-11 NOTE — Progress Notes (Signed)
Subjective:    Patient ID: Christopher Villegas, male    DOB: May 21, 1956, 61 y.o.   MRN: [id number] Chief Complaint  Patient presents with  . Carotid    1 year follow up   Patient presents for a yearly carotid artery stenosis follow-up.  The patient is status post a right carotid endarterectomy on January 11, 2008.  He presents today without complaint.  The patient denies any amaurosis fugax or neurological deficits.  The patient underwent a bilateral carotid artery duplex exam which is notable for a patent right carotid endarterectomy site with no right internal carotid artery stenosis.  Nonhemodynamically significant 1-39% stenosis of the left proximal internal carotid artery.  No significant change in the bilateral internal carotid arteries when compared to the previous exam on April 30, 2016.  The patient denies any fever, nausea or vomiting.   Review of Systems  Constitutional: Negative.   HENT: Negative.   Eyes: Negative.   Respiratory: Negative.   Cardiovascular: Negative.   Gastrointestinal: Negative.   Endocrine: Negative.   Genitourinary: Negative.   Musculoskeletal: Negative.   Skin: Negative.   Allergic/Immunologic: Negative.   Neurological: Negative.   Hematological: Negative.   Psychiatric/Behavioral: Negative.       Objective:   Physical Exam  Constitutional: He is oriented to person, place, and time. He appears well-developed and well-nourished. No distress.  HENT:  Head: Normocephalic and atraumatic.  Eyes: Conjunctivae are normal. Pupils are equal, round, and reactive to light.  Neck: Normal range of motion.  No carotid bruits noted on exam  Cardiovascular: Normal rate, regular rhythm, normal heart sounds and intact distal pulses.  Pulses:      Radial pulses are 2+ on the right side, and 2+ on the left side.  Pulmonary/Chest: Effort normal and breath sounds normal.  Musculoskeletal: Normal range of motion. He exhibits no edema.  Neurological: He is alert and  oriented to person, place, and time.  Skin: Skin is warm and dry. He is not diaphoretic.  Psychiatric: He has a normal mood and affect. His behavior is normal. Judgment and thought content normal.   BP 137/76 (BP Location: Left Arm, Patient Position: Sitting)   Pulse 75   Resp 16   Ht 5\' 9"  (1.753 m)   Wt 208 lb (94.3 kg)   BMI 30.72 kg/m   No past medical history on file.  Social History   Socioeconomic History  . Marital status: Married    Spouse name: Corporate treasurer  . Number of children: 2  . Years of education: 36  . Highest education level: Not on file  Social Needs  . Financial resource strain: Not on file  . Food insecurity - worry: Not on file  . Food insecurity - inability: Not on file  . Transportation needs - medical: Not on file  . Transportation needs - non-medical: Not on file  Occupational History  . Occupation: [employer]  Tobacco Use  . Smoking status: Former Smoker    Packs/day: 1.00    Years: 40.00    Pack years: 40.00    Types: Cigarettes    Last attempt to quit: 09/29/2014    Years since quitting: 2.6  . Smokeless tobacco: Never Used  Substance and Sexual Activity  . Alcohol use: No  . Drug use: No  . Sexual activity: Yes  Other Topics Concern  . Not on file  Social History Narrative  . Not on file   Past Surgical History:  Procedure  Laterality Date  . CAROTID ENDARTERECTOMY    . CYST EXCISION PERINEAL    . TONSILLECTOMY AND ADENOIDECTOMY     Family History  Problem Relation Age of Onset  . Cancer Mother        breast  . Hypertension Mother   . Hyperlipidemia Mother   . Arthritis Mother        osteo  . Hypertension Father   . Heart disease Father        CHF  . Kidney failure Father        causing death  . Autoimmune disease Sister   . Lupus Sister   . Healthy Son   . Healthy Son    Allergies  Allergen Reactions  . Plavix [Clopidogrel] Rash      Assessment & Plan:  Patient presents for a yearly carotid artery  stenosis follow-up.  The patient is status post a right carotid endarterectomy on January 11, 2008.  He presents today without complaint.  The patient denies any amaurosis fugax or neurological deficits.  The patient underwent a bilateral carotid artery duplex exam which is notable for a patent right carotid endarterectomy site with no right internal carotid artery stenosis.  Nonhemodynamically significant 1-39% stenosis of the left proximal internal carotid artery.  No significant change in the bilateral internal carotid arteries when compared to the previous exam on April 30, 2016.  The patient denies any fever, nausea or vomiting.  1. Bilateral carotid artery stenosis - Stable Patient presents asymptomatically for a yearly carotid artery stenosis follow-up Physical exam is unremarkable Carotid duplex is stable with a right patent carotid endarterectomy and 1-39% nonhemodynamically significant left carotid artery stenosis. There is no indication for intervention at this time Patient is to follow-up in 1 year with a repeat carotid artery duplex exam  - VAS US CAROTID; Future  2. Hypercholesteremia - Stable Encouraged good control as its slows the progression of atherosclerotic disease  3. Hyperglycemia - Stable Encouraged good control as its slows the progression of atherosclerotic disease  Current Outpatient Medications on File Prior to Visit  Medication Sig Dispense Refill  . Cholecalciferol (VITAMIN D) 2000 UNITS tablet Take 1 tablet by mouth daily.    Marland Kitchen esomeprazole (NEXIUM) 40 MG capsule Take 1 capsule (40 mg total) by mouth daily. 90 capsule 3  . ezetimibe (ZETIA) 10 MG tablet Take 1 tablet (10 mg total) by mouth daily. 90 tablet 0  . losartan (COZAAR) 50 MG tablet TAKE 1 TABLET BY MOUTH DAILY 30 tablet 11   No current facility-administered medications on file prior to visit.    There are no Patient Instructions on file for this visit. No Follow-up on file.  KIMBERLY A STEGMAYER,  PA-C

## 2017-05-18 ENCOUNTER — Encounter: Payer: Self-pay | Admitting: Gastroenterology

## 2017-05-18 ENCOUNTER — Other Ambulatory Visit: Payer: Self-pay

## 2017-05-18 ENCOUNTER — Ambulatory Visit (INDEPENDENT_AMBULATORY_CARE_PROVIDER_SITE_OTHER): Payer: BLUE CROSS/BLUE SHIELD | Admitting: Gastroenterology

## 2017-05-18 VITALS — BP 137/71 | HR 80 | Ht 69.0 in | Wt 210.0 lb

## 2017-05-18 DIAGNOSIS — K219 Gastro-esophageal reflux disease without esophagitis: Secondary | ICD-10-CM

## 2017-05-18 DIAGNOSIS — Z86010 Personal history of colonic polyps: Secondary | ICD-10-CM

## 2017-05-18 NOTE — Progress Notes (Signed)
Gastroenterology Consultation  Referring Provider:     Jerrol Banana.,* Primary Care Physician:  Jerrol Banana., MD Primary Gastroenterologist:  Dr. Allen Norris     Reason for Consultation:     Reflux        HPI:   Christopher Villegas is a 61 y.o. y/o male referred for consultation & management of reflux by Dr. Rosanna Randy, Retia Passe., MD.  This patient comes in today with a long-standing history of heartburn. The patient had an upper endoscopy back in 2006 and was started on Nexium. The patient has been doing well on Nexium until about a year ago. The patient started to have what he thought was breakthrough acid reflux but only when he exerts himself. Last year the patient underwent a cardiac workup and he was told that is not his heart. The patient states that over the last 2-3 months it has gotten worse with worsening of his heartburn when he is exercising or walking around the house. He states that he will swallow a bunch of air and burp which will make him feel better. There is no report of any shortness of breath or radiation of the pain to his jaw or left arm.  The patient denies any dysphagia. He also reports that he does not have any acid breakthrough while lying down at night.   The patient has had a history of colon polyps with his last colonoscopy 5 years ago. The patient had a few polyps removed by me back in 2009 that were shown to be tubular adenomas.  History reviewed. No pertinent past medical history.  Past Surgical History:  Procedure Laterality Date  . CAROTID ENDARTERECTOMY    . CYST EXCISION PERINEAL    . TONSILLECTOMY AND ADENOIDECTOMY      Prior to Admission medications   Medication Sig Start Date End Date Taking? Authorizing Provider  Cholecalciferol (VITAMIN D) 2000 UNITS tablet Take 1 tablet by mouth daily. 09/20/11   [provider]  esomeprazole (NEXIUM) 40 MG capsule Take 1 capsule (40 mg total) by mouth daily. 11/01/16   Jerrol Banana., MD    ezetimibe (ZETIA) 10 MG tablet Take 1 tablet (10 mg total) by mouth daily. 04/12/17   Jerrol Banana., MD  losartan (COZAAR) 50 MG tablet TAKE 1 TABLET BY MOUTH DAILY 04/04/17   Jerrol Banana., MD    Family History  Problem Relation Age of Onset  . Cancer Mother        breast  . Hypertension Mother   . Hyperlipidemia Mother   . Arthritis Mother        osteo  . Hypertension Father   . Heart disease Father        CHF  . Kidney failure Father        causing death  . Autoimmune disease Sister   . Lupus Sister   . Healthy Son   . Healthy Son      Social History   Tobacco Use  . Smoking status: Former Smoker    Packs/day: 1.00    Years: 40.00    Pack years: 40.00    Types: Cigarettes    Last attempt to quit: 09/29/2014    Years since quitting: 2.6  . Smokeless tobacco: Never Used  Substance Use Topics  . Alcohol use: No  . Drug use: No    Allergies as of 05/18/2017 - Review Complete 05/18/2017  Allergen Reaction Noted  .  Plavix [clopidogrel] Rash 10/18/2014    Review of Systems:    All systems reviewed and negative except where noted in HPI.   Physical Exam:  BP 137/71 (BP Location: Left Arm, Patient Position: Sitting, Cuff Size: Large)   Pulse 80   Ht 5\' 9"  (1.753 m)   Wt 210 lb (95.3 kg)   BMI 31.01 kg/m  No LMP for male patient. Psych:  Alert and cooperative. Normal mood and affect. General:   Alert,  Well-developed, well-nourished, pleasant and cooperative in NAD Head:  Normocephalic and atraumatic. Eyes:  Sclera clear, no icterus.   Conjunctiva pink. Ears:  Normal auditory acuity. Nose:  No deformity, discharge, or lesions. Mouth:  No deformity or lesions,oropharynx pink & moist. Neck:  Supple; no masses or thyromegaly. Lungs:  Respirations even and unlabored.  Clear throughout to auscultation.   No wheezes, crackles, or rhonchi. No acute distress. Heart:  Regular rate and rhythm; no murmurs, clicks, rubs, or gallops. Abdomen:  Normal  bowel sounds.  No bruits.  Soft, non-tender and non-distended without masses, hepatosplenomegaly or hernias noted.  No guarding or rebound tenderness.  Negative Carnett sign.   Rectal:  Deferred.  Msk:  Symmetrical without gross deformities.  Good, equal movement & strength bilaterally. Pulses:  Normal pulses noted. Extremities:  No clubbing or edema.  No cyanosis. Neurologic:  Alert and oriented x3;  grossly normal neurologically. Skin:  Intact without significant lesions or rashes.  No jaundice. Lymph Nodes:  No significant cervical adenopathy. Psych:  Alert and cooperative. Normal mood and affect.  Imaging Studies: No results found.  Assessment and Plan:   Christopher Villegas is a 61 y.o. y/o male who comes in today with a history of acid breakthrough for the last year with the symptoms getting worse in the last 2-3 months. The patient states his heartburn only happens when he exerts himself. The patient will be set up for a colonoscopy due to his history of colon polyps. The patient has been given samples of Dexilant because of his acid breakthrough. He has been told that when he gets the chest pain he should also try Tums if the Dexilant does not relieve his symptoms. The patient will let me know at the time of the colonoscopy if his acid breakthrough still present and if so he may need an upper endoscopy at time of colonoscopy. I have discussed risks & benefits which include, but are not limited to, bleeding, infection, perforation & drug reaction.  The patient agrees with this plan & written consent will be obtained.     Lucilla Lame, MD. [facility]   Note: This dictation was prepared with Dragon dictation along with smaller phrase technology. Any transcriptional errors that result from this process are unintentional.

## 2017-05-25 ENCOUNTER — Other Ambulatory Visit: Payer: Self-pay

## 2017-05-25 DIAGNOSIS — E7800 Pure hypercholesterolemia, unspecified: Secondary | ICD-10-CM

## 2017-05-25 LAB — LIPID PANEL
Cholesterol: 184 mg/dL (ref <200)
HDL: 49 mg/dL (ref >40)
LDL Cholesterol (Calc): 115 mg/dL (calc) — ABNORMAL HIGH
Non-HDL Cholesterol (Calc): 135 mg/dL (calc) — ABNORMAL HIGH (ref <130)
Total CHOL/HDL Ratio: 3.8 (calc) (ref <5.0)
Triglycerides: 96 mg/dL (ref <150)

## 2017-05-31 NOTE — Progress Notes (Signed)
Advised 

## 2017-06-08 ENCOUNTER — Encounter: Payer: Self-pay | Admitting: *Deleted

## 2017-06-08 ENCOUNTER — Other Ambulatory Visit: Payer: Self-pay

## 2017-06-15 ENCOUNTER — Telehealth: Payer: Self-pay | Admitting: Gastroenterology

## 2017-06-15 DIAGNOSIS — L570 Actinic keratosis: Secondary | ICD-10-CM | POA: Diagnosis not present

## 2017-06-15 DIAGNOSIS — L820 Inflamed seborrheic keratosis: Secondary | ICD-10-CM | POA: Diagnosis not present

## 2017-06-15 DIAGNOSIS — R21 Rash and other nonspecific skin eruption: Secondary | ICD-10-CM | POA: Diagnosis not present

## 2017-06-15 DIAGNOSIS — L988 Other specified disorders of the skin and subcutaneous tissue: Secondary | ICD-10-CM | POA: Diagnosis not present

## 2017-06-15 NOTE — Anesthesia Preprocedure Evaluation (Addendum)
Anesthesia Evaluation  Patient identified by MRN, date of birth, ID band  Reviewed: NPO status   History of Anesthesia Complications Negative for: history of anesthetic complications  Airway Mallampati: II  TM Distance: >3 FB Neck ROM: full    Dental no notable dental hx. (+) Missing,    Pulmonary neg pulmonary ROS, former smoker,    Pulmonary exam normal        Cardiovascular Exercise Tolerance: Good hypertension, Normal cardiovascular exam     Neuro/Psych R CEA : 2009  negative psych ROS   GI/Hepatic Neg liver ROS, GERD  Medicated and Controlled,  Endo/Other  negative endocrine ROS  Renal/GU negative Renal ROS  negative genitourinary   Musculoskeletal  (+) Arthritis ,   Abdominal   Peds  Hematology negative hematology ROS (+)   Anesthesia Other Findings stress echo: 10/2015:?NORMAL LEFT VENTRICULAR SYSTOLIC FUNCTION WITH AN ESTIMATED EF = 55 %?NORMAL RIGHT VENTRICULAR SYSTOLIC FUNCTION?MILD TRICUSPID AND MITRAL VALVE INSUFFICIENCY?NO VALVULAR STENOSIS;  cards stable: 06/2016: dr. Nehemiah Massed;   Reproductive/Obstetrics                            Anesthesia Physical Anesthesia Plan  ASA: II  Anesthesia Plan: General   Post-op Pain Management:    Induction:   PONV Risk Score and Plan:   Airway Management Planned: Natural Airway  Additional Equipment:   Intra-op Plan:   Post-operative Plan:   Informed Consent: I have reviewed the patients History and Physical, chart, labs and discussed the procedure including the risks, benefits and alternatives for the proposed anesthesia with the patient or authorized representative who has indicated his/her understanding and acceptance.     Plan Discussed with: CRNA  Anesthesia Plan Comments: (TIVA)        Anesthesia Quick Evaluation

## 2017-06-15 NOTE — Discharge Instructions (Signed)
General Anesthesia, Adult, Care After °These instructions provide you with information about caring for yourself after your procedure. Your health care provider may also give you more specific instructions. Your treatment has been planned according to current medical practices, but problems sometimes occur. Call your health care provider if you have any problems or questions after your procedure. °What can I expect after the procedure? °After the procedure, it is common to have: °· Vomiting. °· A sore throat. °· Mental slowness. ° °It is common to feel: °· Nauseous. °· Cold or shivery. °· Sleepy. °· Tired. °· Sore or achy, even in parts of your body where you did not have surgery. ° °Follow these instructions at home: °For at least 24 hours after the procedure: °· Do not: °? Participate in activities where you could fall or become injured. °? Drive. °? Use heavy machinery. °? Drink alcohol. °? Take sleeping pills or medicines that cause drowsiness. °? Make important decisions or sign legal documents. °? Take care of children on your own. °· Rest. °Eating and drinking °· If you vomit, drink water, juice, or soup when you can drink without vomiting. °· Drink enough fluid to keep your urine clear or pale yellow. °· Make sure you have little or no nausea before eating solid foods. °· Follow the diet recommended by your health care provider. °General instructions °· Have a responsible adult stay with you until you are awake and alert. °· Return to your normal activities as told by your health care provider. Ask your health care provider what activities are safe for you. °· Take over-the-counter and prescription medicines only as told by your health care provider. °· If you smoke, do not smoke without supervision. °· Keep all follow-up visits as told by your health care provider. This is important. °Contact a health care provider if: °· You continue to have nausea or vomiting at home, and medicines are not helpful. °· You  cannot drink fluids or start eating again. °· You cannot urinate after 8-12 hours. °· You develop a skin rash. °· You have fever. °· You have increasing redness at the site of your procedure. °Get help right away if: °· You have difficulty breathing. °· You have chest pain. °· You have unexpected bleeding. °· You feel that you are having a life-threatening or urgent problem. °This information is not intended to replace advice given to you by your health care provider. Make sure you discuss any questions you have with your health care provider. °Document Released: 09/06/2000 Document Revised: 11/03/2015 Document Reviewed: 05/15/2015 °Elsevier Interactive Patient Education © 2018 [employer] ° °

## 2017-06-15 NOTE — Telephone Encounter (Signed)
Patient needs to know what time to take her second bottle of prep. He said you told him he didn't need to get up early, he can do it before bed.

## 2017-06-15 NOTE — Telephone Encounter (Signed)
Pt advised he may take his 2nd bottle prior to going to bed since his arrival time is 7:00am.

## 2017-06-16 ENCOUNTER — Ambulatory Visit
Admission: RE | Admit: 2017-06-16 | Discharge: 2017-06-16 | Disposition: A | Discharge status: Home / Self Care | Payer: BLUE CROSS/BLUE SHIELD | Source: Ambulatory Visit | Attending: Gastroenterology | Admitting: Gastroenterology

## 2017-06-16 ENCOUNTER — Ambulatory Visit: Payer: BLUE CROSS/BLUE SHIELD | Admitting: Anesthesiology

## 2017-06-16 ENCOUNTER — Encounter
Admission: RE | Disposition: A | Discharge status: Home / Self Care | Payer: Self-pay | Source: Ambulatory Visit | Attending: Gastroenterology

## 2017-06-16 DIAGNOSIS — Z87891 Personal history of nicotine dependence: Secondary | ICD-10-CM | POA: Insufficient documentation

## 2017-06-16 DIAGNOSIS — R1013 Epigastric pain: Secondary | ICD-10-CM | POA: Diagnosis not present

## 2017-06-16 DIAGNOSIS — D125 Benign neoplasm of sigmoid colon: Secondary | ICD-10-CM | POA: Diagnosis not present

## 2017-06-16 DIAGNOSIS — Z8719 Personal history of other diseases of the digestive system: Secondary | ICD-10-CM | POA: Diagnosis not present

## 2017-06-16 DIAGNOSIS — Z860100 Personal history of colon polyps, unspecified: Secondary | ICD-10-CM

## 2017-06-16 DIAGNOSIS — I10 Essential (primary) hypertension: Secondary | ICD-10-CM | POA: Diagnosis not present

## 2017-06-16 DIAGNOSIS — Z888 Allergy status to other drugs, medicaments and biological substances status: Secondary | ICD-10-CM | POA: Diagnosis not present

## 2017-06-16 DIAGNOSIS — K635 Polyp of colon: Secondary | ICD-10-CM

## 2017-06-16 DIAGNOSIS — M1990 Unspecified osteoarthritis, unspecified site: Secondary | ICD-10-CM | POA: Diagnosis not present

## 2017-06-16 DIAGNOSIS — Z79899 Other long term (current) drug therapy: Secondary | ICD-10-CM | POA: Diagnosis not present

## 2017-06-16 DIAGNOSIS — K219 Gastro-esophageal reflux disease without esophagitis: Secondary | ICD-10-CM | POA: Diagnosis not present

## 2017-06-16 DIAGNOSIS — K2930 Chronic superficial gastritis without bleeding: Secondary | ICD-10-CM | POA: Diagnosis not present

## 2017-06-16 DIAGNOSIS — D124 Benign neoplasm of descending colon: Secondary | ICD-10-CM

## 2017-06-16 DIAGNOSIS — Z09 Encounter for follow-up examination after completed treatment for conditions other than malignant neoplasm: Secondary | ICD-10-CM | POA: Diagnosis not present

## 2017-06-16 HISTORY — PX: POLYPECTOMY: SHX5525

## 2017-06-16 HISTORY — PX: ESOPHAGOGASTRODUODENOSCOPY (EGD) WITH PROPOFOL: SHX5813

## 2017-06-16 HISTORY — PX: COLONOSCOPY WITH PROPOFOL: SHX5780

## 2017-06-16 HISTORY — DX: Gastro-esophageal reflux disease without esophagitis: K21.9

## 2017-06-16 SURGERY — COLONOSCOPY WITH PROPOFOL
Anesthesia: General | Site: Throat | Wound class: Contaminated

## 2017-06-16 MED ORDER — OXYCODONE HCL 5 MG PO TABS: 5.0000 mg | ORAL_TABLET | Freq: Once | ORAL | Status: DC | PRN

## 2017-06-16 MED ORDER — STERILE WATER FOR IRRIGATION IR SOLN
Status: DC | PRN
  Administered 2017-06-16: 07:00:00

## 2017-06-16 MED ORDER — OXYCODONE HCL 5 MG/5ML PO SOLN: 5.0000 mg | Freq: Once | ORAL | Status: DC | PRN

## 2017-06-16 MED ORDER — PROPOFOL 10 MG/ML IV BOLUS
INTRAVENOUS | Status: DC | PRN
  Administered 2017-06-16 (×3): 80 mg via INTRAVENOUS
  Administered 2017-06-16: 40 mg via INTRAVENOUS

## 2017-06-16 MED ORDER — SODIUM CHLORIDE 0.9 % IV SOLN: INTRAVENOUS | Status: DC

## 2017-06-16 MED ORDER — GLYCOPYRROLATE 0.2 MG/ML IJ SOLN
INTRAMUSCULAR | Status: DC | PRN
  Administered 2017-06-16: 0.1 mg via INTRAVENOUS

## 2017-06-16 MED ORDER — LACTATED RINGERS IV SOLN
INTRAVENOUS | Status: DC
  Administered 2017-06-16 (×2): via INTRAVENOUS

## 2017-06-16 MED ORDER — LIDOCAINE HCL (CARDIAC) 20 MG/ML IV SOLN
INTRAVENOUS | Status: DC | PRN
  Administered 2017-06-16: 20 mg via INTRAVENOUS

## 2017-06-16 SURGICAL SUPPLY — 35 items
BALLN DILATOR 10-12 8 (BALLOONS)
BALLN DILATOR 12-15 8 (BALLOONS)
BALLN DILATOR 15-18 8 (BALLOONS)
BALLN DILATOR CRE 0-12 8 (BALLOONS)
BALLN DILATOR ESOPH 8 10 CRE (MISCELLANEOUS) IMPLANT
BALLOON DILATOR 12-15 8 (BALLOONS) IMPLANT
BALLOON DILATOR 15-18 8 (BALLOONS) IMPLANT
BALLOON DILATOR CRE 0-12 8 (BALLOONS) IMPLANT
BLOCK BITE 60FR ADLT L/F GRN (MISCELLANEOUS) ×4 IMPLANT
CANISTER SUCT 1200ML W/VALVE (MISCELLANEOUS) ×4 IMPLANT
CLIP HMST 235XBRD CATH ROT (MISCELLANEOUS) IMPLANT
CLIP RESOLUTION 360 11X235 (MISCELLANEOUS)
FCP ESCP3.2XJMB 240X2.8X (MISCELLANEOUS)
FORCEPS BIOP RAD 4 LRG CAP 4 (CUTTING FORCEPS) IMPLANT
FORCEPS BIOP RJ4 240 W/NDL (MISCELLANEOUS)
FORCEPS ESCP3.2XJMB 240X2.8X (MISCELLANEOUS) IMPLANT
GOWN CVR UNV OPN BCK APRN NK (MISCELLANEOUS) ×6 IMPLANT
GOWN ISOL THUMB LOOP REG UNIV (MISCELLANEOUS) ×2
INJECTOR VARIJECT VIN23 (MISCELLANEOUS) IMPLANT
KIT DEFENDO VALVE AND CONN (KITS) IMPLANT
KIT ENDO PROCEDURE OLY (KITS) ×4 IMPLANT
MARKER SPOT ENDO TATTOO 5ML (MISCELLANEOUS) IMPLANT
PAD GROUND ADULT SPLIT (MISCELLANEOUS) IMPLANT
PROBE APC STR FIRE (PROBE) IMPLANT
RETRIEVER NET PLAT FOOD (MISCELLANEOUS) IMPLANT
RETRIEVER NET ROTH 2.5X230 LF (MISCELLANEOUS) IMPLANT
SNARE SHORT THROW 13M SML OVAL (MISCELLANEOUS) IMPLANT
SNARE SHORT THROW 30M LRG OVAL (MISCELLANEOUS) ×4 IMPLANT
SNARE SNG USE RND 15MM (INSTRUMENTS) IMPLANT
SPOT EX ENDOSCOPIC TATTOO (MISCELLANEOUS)
SYR INFLATION 60ML (SYRINGE) IMPLANT
TRAP ETRAP POLY (MISCELLANEOUS) IMPLANT
VARIJECT INJECTOR VIN23 (MISCELLANEOUS)
WATER STERILE IRR 250ML POUR (IV SOLUTION) ×4 IMPLANT
WIRE CRE 18-20MM 8CM F G (MISCELLANEOUS) IMPLANT

## 2017-06-16 NOTE — Op Note (Addendum)
[facility] Patient Name: Christopher Villegas Procedure Date: 06/16/2017 8:02 AM MRN: [id number] Account #: [id number] Date of Birth: 09-23-1955 Admit Type: Outpatient Age: 62 Room: [facility] OR ROOM 01 Gender: Male Note Status: Supervisor Override Procedure:            Upper GI endoscopy Indications:          Dyspepsia, Heartburn Providers:            Lucilla Lame MD, MD Referring MD:         Janine Ores. Rosanna Randy, MD (Referring MD) Medicines:            Propofol per Anesthesia Complications:        No immediate complications. Procedure:            Pre-Anesthesia Assessment:                       - Prior to the procedure, a History and Physical was                        performed, and patient medications and allergies were                        reviewed. The patient's tolerance of previous                        anesthesia was also reviewed. The risks and benefits of                        the procedure and the sedation options and risks were                        discussed with the patient. All questions were                        answered, and informed consent was obtained. Prior                        Anticoagulants: The patient has taken no previous                        anticoagulant or antiplatelet agents. ASA Grade                        Assessment: II - A patient with mild systemic disease.                        After reviewing the risks and benefits, the patient was                        deemed in satisfactory condition to undergo the                        procedure.                       After obtaining informed consent, the endoscope was                        passed under direct vision. Throughout the procedure,  the patient's blood pressure, pulse, and oxygen                        saturations were monitored continuously. The Olympus                        [id number]-[id number] Endoscope (S#. [id number]) was introduced           through the mouth, and advanced to the second part of                        duodenum. The upper GI endoscopy was accomplished                        without difficulty. The patient tolerated the procedure                        well. Findings:      The examined esophagus was normal. Biopsies were taken with a cold       forceps for histology.      The entire examined stomach was normal. Biopsies were taken with a cold       forceps for histology.      The examined duodenum was normal. Impression:           - Normal esophagus. Biopsied.                       - Normal stomach. Biopsied.                       - Normal examined duodenum. Recommendation:       - Perform a colonoscopy today.                       - If the biopsies negative then consider pH and                        impedance studies. Procedure Code(s):    --- Professional ---                       [phone], Esophagogastroduodenoscopy, flexible, transoral;                        with biopsy, single or multiple Diagnosis Code(s):    --- Professional ---                       R12, Heartburn                       R10.13, Epigastric pain CPT copyright 2018 American Medical Association. All rights reserved. The codes documented in this report are preliminary and upon coder review may  be revised to meet current compliance requirements. Lucilla Lame MD, MD 06/16/2017 8:13:36 AM This report has been signed electronically. Number of Addenda: 0 Note Initiated On: 06/16/2017 8:02 AM      [facility]

## 2017-06-16 NOTE — H&P (Signed)
Christopher Lame, MD [address] [place], [place] [address] [phone] Fax : [phone]  Primary Care Physician:  Jerrol Banana., MD Primary Gastroenterologist:  Dr. Allen Norris  Pre-Procedure History & Physical: HPI:  Christopher Villegas is a 61 y.o. male is here for an endoscopy and colonoscopy.   Past Medical History:  Diagnosis Date  . GERD (gastroesophageal reflux disease)     Past Surgical History:  Procedure Laterality Date  . CAROTID ENDARTERECTOMY Right 2009  . CYST EXCISION PERINEAL    . TONSILLECTOMY AND ADENOIDECTOMY      Prior to Admission medications   Medication Sig Start Date End Date Taking? Authorizing Provider  Cholecalciferol (VITAMIN D) 2000 UNITS tablet Take 1 tablet by mouth daily. 09/20/11  Yes [provider]  esomeprazole (NEXIUM) 40 MG capsule Take 1 capsule (40 mg total) by mouth daily. 11/01/16  Yes Jerrol Banana., MD  ezetimibe (ZETIA) 10 MG tablet Take 1 tablet (10 mg total) by mouth daily. 04/12/17  Yes Jerrol Banana., MD  losartan (COZAAR) 50 MG tablet TAKE 1 TABLET BY MOUTH DAILY 04/04/17  Yes Jerrol Banana., MD    Allergies as of 05/18/2017 - Review Complete 05/18/2017  Allergen Reaction Noted  . Plavix [clopidogrel] Rash 10/18/2014    Family History  Problem Relation Age of Onset  . Cancer Mother        breast  . Hypertension Mother   . Hyperlipidemia Mother   . Arthritis Mother        osteo  . Hypertension Father   . Heart disease Father        CHF  . Kidney failure Father        causing death  . Autoimmune disease Sister   . Lupus Sister   . Healthy Son   . Healthy Son     Social History   Socioeconomic History  . Marital status: Married    Spouse name: Corporate treasurer  . Number of children: 2  . Years of education: 31  . Highest education level: Not on file  Social Needs  . Financial resource strain: Not on file  . Food insecurity - worry: Not on file  . Food  insecurity - inability: Not on file  . Transportation needs - medical: Not on file  . Transportation needs - non-medical: Not on file  Occupational History  . Occupation: [employer]  Tobacco Use  . Smoking status: Former Smoker    Packs/day: 1.00    Years: 40.00    Pack years: 40.00    Types: Cigarettes    Last attempt to quit: 09/29/2014    Years since quitting: 2.7  . Smokeless tobacco: Never Used  Substance and Sexual Activity  . Alcohol use: No  . Drug use: No  . Sexual activity: Yes  Other Topics Concern  . Not on file  Social History Narrative  . Not on file    Review of Systems: See HPI, otherwise negative ROS  Physical Exam: BP 119/78   Pulse 86   Temp 97.7 F (36.5 C) (Temporal)   Resp 16   Ht 5\' 9"  (1.753 m)   Wt 199 lb (90.3 kg)   SpO2 97%   BMI 29.39 kg/m  General:   Alert,  pleasant and cooperative in NAD Head:  Normocephalic and atraumatic. Neck:  Supple; no masses or thyromegaly. Lungs:  Clear throughout to auscultation.    Heart:  Regular  rate and rhythm. Abdomen:  Soft, nontender and nondistended. Normal bowel sounds, without guarding, and without rebound.   Neurologic:  Alert and  oriented x4;  grossly normal neurologically.  Impression/Plan: Christopher Villegas is here for an endoscopy and colonoscopy to be performed for GERD and history of colon polyps  Risks, benefits, limitations, and alternatives regarding  endoscopy and colonoscopy have been reviewed with the patient.  Questions have been answered.  All parties agreeable.   Christopher Lame, MD  06/16/2017, 7:36 AM

## 2017-06-16 NOTE — Anesthesia Postprocedure Evaluation (Signed)
Anesthesia Post Note  Patient: Judge Duque II  Procedure(s) Performed: COLONOSCOPY WITH PROPOFOL (N/A Rectum) ESOPHAGOGASTRODUODENOSCOPY (EGD) WITH PROPOFOL (N/A Throat) POLYPECTOMY (Rectum)  Patient location during evaluation: PACU Anesthesia Type: General Level of consciousness: awake and alert Pain management: pain level controlled Vital Signs Assessment: post-procedure vital signs reviewed and stable Respiratory status: spontaneous breathing, nonlabored ventilation, respiratory function stable and patient connected to nasal cannula oxygen Cardiovascular status: blood pressure returned to baseline and stable Postop Assessment: no apparent nausea or vomiting Anesthetic complications: no    Saran Laviolette

## 2017-06-16 NOTE — Transfer of Care (Signed)
Immediate Anesthesia Transfer of Care Note  Patient: Christopher Villegas  Procedure(s) Performed: COLONOSCOPY WITH PROPOFOL (N/A Rectum) ESOPHAGOGASTRODUODENOSCOPY (EGD) WITH PROPOFOL (N/A Throat) POLYPECTOMY (Rectum)  Patient Location: PACU  Anesthesia Type: General  Level of Consciousness: awake, alert  and patient cooperative  Airway and Oxygen Therapy: Patient Spontanous Breathing and Patient connected to supplemental oxygen  Post-op Assessment: Post-op Vital signs reviewed, Patient's Cardiovascular Status Stable, Respiratory Function Stable, Patent Airway and No signs of Nausea or vomiting  Post-op Vital Signs: Reviewed and stable  Complications: No apparent anesthesia complications

## 2017-06-16 NOTE — Op Note (Signed)
[facility] Patient Name: Christopher Villegas Procedure Date: 06/16/2017 8:13 AM MRN: [id number] Account #: [id number] Date of Birth: 1955-06-22 Admit Type: Outpatient Age: 62 Room: [facility] OR ROOM 01 Gender: Male Note Status: Finalized Procedure:            Colonoscopy Indications:          High risk colon cancer surveillance: Personal history                        of colonic polyps Providers:            Lucilla Lame MD, MD Referring MD:         Janine Ores. Rosanna Randy, MD (Referring MD) Medicines:            Propofol per Anesthesia Complications:        No immediate complications. Procedure:            Pre-Anesthesia Assessment:                       - Prior to the procedure, a History and Physical was                        performed, and patient medications and allergies were                        reviewed. The patient's tolerance of previous                        anesthesia was also reviewed. The risks and benefits of                        the procedure and the sedation options and risks were                        discussed with the patient. All questions were                        answered, and informed consent was obtained. Prior                        Anticoagulants: The patient has taken no previous                        anticoagulant or antiplatelet agents. ASA Grade                        Assessment: II - A patient with mild systemic disease.                        After reviewing the risks and benefits, the patient was                        deemed in satisfactory condition to undergo the                        procedure.                       After obtaining informed consent, the colonoscope was  passed under direct vision. Throughout the procedure,                        the patient's blood pressure, pulse, and oxygen                        saturations were monitored continuously. The [place] [id number]) was introduced through the                        anus and advanced to the the cecum, identified by                        appendiceal orifice and ileocecal valve. The                        colonoscopy was performed without difficulty. The                        patient tolerated the procedure well. The quality of                        the bowel preparation was excellent. Findings:      The perianal and digital rectal examinations were normal.      Three sessile polyps were found in the descending colon. The polyps were       2 to 4 mm in size. These polyps were removed with a cold biopsy forceps.       Resection and retrieval were complete.      A 3 mm polyp was found in the sigmoid colon. The polyp was sessile. The       polyp was removed with a cold biopsy forceps. Resection and retrieval       were complete. Impression:           - Three 2 to 4 mm polyps in the descending colon,                        removed with a cold biopsy forceps. Resected and                        retrieved.                       - One 3 mm polyp in the sigmoid colon, removed with a                        cold biopsy forceps. Resected and retrieved. Recommendation:       - Discharge patient to home.                       - Resume previous diet.                       - Continue present medications.                       - Await pathology results.                       -  Repeat colonoscopy in 5 years for surveillance. Procedure Code(s):    --- Professional ---                       [phone], Colonoscopy, flexible; with biopsy, single or                        multiple Diagnosis Code(s):    --- Professional ---                       Z86.010, Personal history of colonic polyps                       D12.4, Benign neoplasm of descending colon                       D12.5, Benign neoplasm of sigmoid colon CPT copyright 2016 American Medical Association. All rights reserved. The codes  documented in this report are preliminary and upon coder review may  be revised to meet current compliance requirements. Lucilla Lame MD, MD 06/16/2017 8:26:18 AM This report has been signed electronically. Number of Addenda: 0 Note Initiated On: 06/16/2017 8:13 AM Scope Withdrawal Time: 0 hours 7 minutes 18 seconds  Total Procedure Duration: 0 hours 9 minutes 11 seconds       [facility]

## 2017-06-18 ENCOUNTER — Encounter: Payer: Self-pay | Admitting: Gastroenterology

## 2017-06-20 ENCOUNTER — Encounter: Payer: Self-pay | Admitting: Gastroenterology

## 2017-06-20 ENCOUNTER — Telehealth: Payer: Self-pay

## 2017-06-20 NOTE — Telephone Encounter (Signed)
Spoke with patient. He checked with his pharmacist and they told him they checked and it was not the batch/lot numbers they have dispensed. Patient advised that it is ok to continue this medication if lot number is not the effected one. Patient understood-Christopher Villegas, RMA

## 2017-06-20 NOTE — Telephone Encounter (Signed)
Pt is concerned because he believes his Losartan has been recalled. He is requesting a call back.

## 2017-06-22 ENCOUNTER — Encounter: Payer: Self-pay | Admitting: Internal Medicine

## 2017-06-22 ENCOUNTER — Ambulatory Visit: Payer: BLUE CROSS/BLUE SHIELD | Admitting: Internal Medicine

## 2017-06-22 VITALS — BP 147/73 | HR 83 | Ht 69.0 in | Wt 211.0 lb

## 2017-06-22 DIAGNOSIS — I208 Other forms of angina pectoris: Secondary | ICD-10-CM | POA: Diagnosis not present

## 2017-06-22 DIAGNOSIS — E785 Hyperlipidemia, unspecified: Secondary | ICD-10-CM | POA: Diagnosis not present

## 2017-06-22 DIAGNOSIS — I200 Unstable angina: Secondary | ICD-10-CM

## 2017-06-22 DIAGNOSIS — I10 Essential (primary) hypertension: Secondary | ICD-10-CM

## 2017-06-22 DIAGNOSIS — I6523 Occlusion and stenosis of bilateral carotid arteries: Secondary | ICD-10-CM

## 2017-06-22 DIAGNOSIS — Z01810 Encounter for preprocedural cardiovascular examination: Secondary | ICD-10-CM | POA: Diagnosis not present

## 2017-06-22 MED ORDER — ASPIRIN EC 81 MG PO TBEC: 81.0000 mg | DELAYED_RELEASE_TABLET | Freq: Every day | ORAL | 3 refills | Status: DC

## 2017-06-22 MED ORDER — METOPROLOL TARTRATE 25 MG PO TABS: 25.0000 mg | ORAL_TABLET | Freq: Two times a day (BID) | ORAL | 3 refills | Status: DC

## 2017-06-22 MED ORDER — ROSUVASTATIN CALCIUM 5 MG PO TABS: 5.0000 mg | ORAL_TABLET | Freq: Every day | ORAL | 3 refills | Status: DC

## 2017-06-22 MED ORDER — NITROGLYCERIN 0.4 MG SL SUBL: 0.4000 mg | SUBLINGUAL_TABLET | SUBLINGUAL | 2 refills | Status: DC | PRN

## 2017-06-22 NOTE — H&P (View-Only) (Signed)
New Outpatient Visit Date: 06/22/2017  Referring Provider: Jerrol Banana., MD [address] [place], [place] [address]  Chief Complaint: Chest pain  HPI:  Christopher Villegas is a 62 y.o. male who is being seen today as a self-referral for chest pain. He has a history of hypertension, hyperlipidemia, and carotid artery stenosis status post right carotid endarterectomy.  Over the last few years, Christopher Villegas has experienced intermittent "indigestion" that he describes as a substernal burning.  He notes that it most frequently occurs when he exerts himself and has been gradually progressing over the last few months.  He underwent cardiac workup by Dr. Nehemiah Massed in 10/2015.  Echocardiogram and myocardial perfusion stress test at that time were unrevealing.  It seemed as though his pain dissipated for several months, though it has become noticeably worse in the last 3-6 months.  He notes that walking from the parking lot now brings on discomfort.  It promptly resolves with rest.  He has not had any discomfort at rest.  He notes associated shortness of breath.  Christopher Villegas denies palpitations, lightheadedness, orthopnea, PND, and edema.  Christopher Villegas is concerned about coronary artery calcium that was previously noted on a lung cancer screening CT of the chest.  Other than the aforementioned myocardial perfusion stress test, he has never undergone additional ischemia testing.  He is currently only on ezetimibe for lipid therapy, as a mild asymptomatic increase in total CK was noted this summer with this prompted discontinuation of rosuvastatin which she had been on for many years.  Christopher Villegas underwent EGD last week for evaluation of the aforementioned "indigestion."  This was normal.  Given these findings, he is concerned that underlying cardiac disease may in fact be the cause of his pain.  --------------------------------------------------------------------------------------------------  Cardiovascular  History & Procedures: Cardiovascular Problems:  Accelerating angina  Risk Factors:  Cerebrovascular disease, hypertension, hyperlipidemia, male gender, obesity, age greater than 20, and history of tobacco use.  Cath/PCI:  None  CV Surgery:  Right carotid endarterectomy (12/2007, Dr. Lucky Cowboy)  EP Procedures and Devices:  None  Non-Invasive Evaluation(s):  Carotid Doppler (05/11/17): Patent right carotid endarterectomy site with no significant internal carotid artery stenosis.  Mild left proximal internal carotid artery disease (less than 40%).  No significant change from prior study in 04/2016.  Exercise MPI (11/12/15): No ischemia or scar.  LVEF mildly reduced at 45%.  Good exercise capacity.  TTE (11/12/15): Normal biventricular systolic function (LVEF 68%).  Mild TR and MR.  Recent CV Pertinent Labs: Lab Results  Component Value Date   CHOL 184 05/25/2017   CHOL 190 12/17/2016   HDL 49 05/25/2017   HDL 49 12/17/2016   LDLCALC 116 (H) 12/17/2016   TRIG 96 05/25/2017   CHOLHDL 3.8 05/25/2017   K 4.3 04/06/2017   BUN 27 (H) 04/06/2017   BUN 15 04/01/2016   CREATININE 1.32 (H) 04/06/2017    --------------------------------------------------------------------------------------------------  Past Medical History:  Diagnosis Date  . GERD (gastroesophageal reflux disease)     Past Surgical History:  Procedure Laterality Date  . CAROTID ENDARTERECTOMY Right 2009  . COLONOSCOPY WITH PROPOFOL N/A 06/16/2017   Procedure: COLONOSCOPY WITH PROPOFOL;  Surgeon: Lucilla Lame, MD;  Location: [facility];  Service: Endoscopy;  Laterality: N/A;  . CYST EXCISION PERINEAL    . ESOPHAGOGASTRODUODENOSCOPY (EGD) WITH PROPOFOL N/A 06/16/2017   Procedure: ESOPHAGOGASTRODUODENOSCOPY (EGD) WITH PROPOFOL;  Surgeon: Lucilla Lame, MD;  Location: [facility];  Service: Endoscopy;  Laterality: N/A;  .  POLYPECTOMY  06/16/2017   Procedure: POLYPECTOMY;  Surgeon: Lucilla Lame, MD;   Location: [facility];  Service: Endoscopy;;  . TONSILLECTOMY AND ADENOIDECTOMY      Current Meds  Medication Sig  . Cholecalciferol (VITAMIN D) 2000 UNITS tablet Take 1 tablet by mouth daily.  Marland Kitchen esomeprazole (NEXIUM) 40 MG capsule Take 1 capsule (40 mg total) by mouth daily.  Marland Kitchen ezetimibe (ZETIA) 10 MG tablet Take 1 tablet (10 mg total) by mouth daily.  Marland Kitchen losartan (COZAAR) 50 MG tablet TAKE 1 TABLET BY MOUTH DAILY    Allergies: Plavix [clopidogrel]  Social History   Socioeconomic History  . Marital status: Married    Spouse name: Corporate treasurer  . Number of children: 2  . Years of education: 4  . Highest education level: Not on file  Social Needs  . Financial resource strain: Not on file  . Food insecurity - worry: Not on file  . Food insecurity - inability: Not on file  . Transportation needs - medical: Not on file  . Transportation needs - non-medical: Not on file  Occupational History  . Occupation: [employer]  Tobacco Use  . Smoking status: Former Smoker    Packs/day: 1.00    Years: 40.00    Pack years: 40.00    Types: Cigarettes    Last attempt to quit: 09/29/2014    Years since quitting: 2.7  . Smokeless tobacco: Never Used  Substance and Sexual Activity  . Alcohol use: No    Comment: Quit 1990  . Drug use: No  . Sexual activity: Yes  Other Topics Concern  . Not on file  Social History Narrative  . Not on file    Family History  Problem Relation Age of Onset  . Cancer Mother        breast  . Hypertension Mother   . Hyperlipidemia Mother   . Arthritis Mother        osteo  . Hypertension Father   . Heart disease Father        CHF  . Kidney failure Father        causing death  . Autoimmune disease Sister   . Lupus Sister   . Healthy Son   . Healthy Son     Review of Systems: A 12-system review of systems was performed and was negative except as noted in the  HPI.  --------------------------------------------------------------------------------------------------  Physical Exam: BP (!) 147/73 (BP Location: Right Arm, Patient Position: Sitting, Cuff Size: Normal)   Pulse 83   Ht 5\' 9"  (1.753 m)   Wt 211 lb (95.7 kg)   BMI 31.16 kg/m   General: Obese man, seated comfortably in the exam room. HEENT: No conjunctival pallor or scleral icterus. Moist mucous membranes. OP clear. Neck: Supple without lymphadenopathy, thyromegaly, JVD, or HJR.  Healed right carotid endarterectomy scar. Lungs: Normal work of breathing. Clear to auscultation bilaterally without wheezes or crackles. Heart: Regular rate and rhythm without murmurs, rubs, or gallops. Non-displaced PMI. Abd: Bowel sounds present. Soft, NT/ND without hepatosplenomegaly Ext: No lower extremity edema. Radial, PT, and DP pulses are 2+ bilaterally Skin: Warm and dry without rash. Neuro: CNIII-XII intact. Strength and fine-touch sensation intact in upper and lower extremities bilaterally. Psych: Normal mood and affect.  EKG: Sinus rhythm without abnormalities.  Lab Results  Component Value Date   WBC 7.7 04/06/2017   HGB 14.6 04/06/2017   HCT 42.3 04/06/2017   MCV 89.1 04/06/2017   PLT 254 04/06/2017  Lab Results  Component Value Date   NA 137 04/06/2017   K 4.3 04/06/2017   CL 103 04/06/2017   CO2 26 04/06/2017   BUN 27 (H) 04/06/2017   CREATININE 1.32 (H) 04/06/2017   GLUCOSE 94 04/06/2017   ALT 22 04/06/2017    Lab Results  Component Value Date   CHOL 184 05/25/2017   HDL 49 05/25/2017   LDLCALC 116 (H) 12/17/2016   TRIG 96 05/25/2017   CHOLHDL 3.8 05/25/2017     --------------------------------------------------------------------------------------------------  ASSESSMENT AND PLAN: Accelerating angina Christopher Villegas reports exertional chest burning for several years, which has progressed over the last 3-6 months.  Symptoms are consistent with CCS class III angina.  EKG  today is normal.  We have discussed further evaluation options; given his multiple cardiac risk factors and high pretest probability for CAD, we have agreed to proceed directly with cardiac catheterization. I have reviewed the risks, indications, and alternatives to cardiac catheterization, possible angioplasty, and stenting with the patient. Risks include but are not limited to bleeding, infection, vascular injury, stroke, myocardial infection, arrhythmia, kidney injury, radiation-related injury in the case of prolonged fluoroscopy use, emergency cardiac surgery, and death. The patient understands the risks of serious complication is 1-2 in 2706 with diagnostic cardiac cath and 1-2% or less with angioplasty/stenting.  In the meantime, we will initiate aspirin 81 mg daily and metoprolol tartrate 25 mg twice daily.  Have also provided Christopher Villegas with a prescription for sublingual nitroglycerin to be taken as needed for chest pain.  If he has refractory chest pain, he was instructed to call 911.  Hyperlipidemia In the setting of suspected angina as well as clinical ASCVD with prior right carotid endarterectomy, Christopher Villegas warrants aggressive lipid control with an LDL less than 70.  He is currently only on ezetimibe, with a most recent LDL of 115 last month.  We have discussed risks and benefits of a retrial of statin therapy and have agreed to start rosuvastatin 5 mg daily.  I will check a CMP and total CK today.  Hypertension Blood pressure mildly elevated today.  We will initiate metoprolol tartrate 25 mg twice daily, as above.  Carotid artery disease No symptoms.  We will restart aspirin and rosuvastatin, as above.  Continue follow-up with Dr. Lucky Cowboy.  Follow-up: To be determined based on results of cardiac catheterization.  Nelva Bush, MD 06/22/2017 2:00 PM

## 2017-06-22 NOTE — Patient Instructions (Addendum)
Medication Instructions:  Your physician has recommended you make the following change in your medication:  1- START Aspirin 81 mg by mouth once a day. 2- START Crestor 5 mg (1 tablet) by mouth once a day. 3- START Metoprolol 25 mg (1 tablet) by mouth twice a day. 3- TAKE Nitroglycerin 0.4 mg (1 tablet) dissolved under the tongue every 5 minutes as needed for chest pain for MAXIMUM of 3 doses. - If pain does not subside then call 911 or go immediately to the emergency room.   Labwork: Your physician recommends that you return for lab work in: TODAY (CMET, CBC, TOTAL CK, PT/INR).   Testing/Procedures: Your physician has requested that you have a cardiac catheterization. Cardiac catheterization is used to diagnose and/or treat various heart conditions. Doctors may recommend this procedure for a number of different reasons. The most common reason is to evaluate chest pain. Chest pain can be a symptom of coronary artery disease (CAD), and cardiac catheterization can show whether plaque is narrowing or blocking your heart's arteries. This procedure is also used to evaluate the valves, as well as measure the blood flow and oxygen levels in different parts of your heart. For further information please visit [email or web address]. Please follow instruction sheet, as given.     [facility] [address], [place] [address] Dept: [phone] Loc: [place]  06/22/2017  You are scheduled for A LEFT Cardiac Catheterization on Friday, January 18 with Dr. Harrell Gave End.  1. Please arrive at the [facility] (Main Entrance A) at [facility]: [place], [place] [address] at 10:00 AM (two hours before your procedure to ensure your preparation). Free valet parking service is available.   Special note: Every effort is made to have your procedure done on time. Please  understand that emergencies sometimes delay scheduled procedures.  2. Diet: Do not eat or drink anything after midnight prior to your procedure except sips of water to take medications.  3. Labs: TODAY.  4. Medication instructions in preparation for your procedure:  On the morning of your procedure, take your Aspirin and any morning medicines NOT listed above.  You may use sips of water.  5. Plan for one night stay--bring personal belongings. 6. Bring a current list of your medications and current insurance cards. 7. You MUST have a responsible person to drive you home. 8. Someone MUST be with you the first 24 hours after you arrive home or your discharge will be delayed. 9. Please wear clothes that are easy to get on and off and wear slip-on shoes.  Thank you for allowing [place] to care for you!   -- [facility]   Follow-Up: Your physician recommends that you schedule a follow-up appointment in: TO BE DETERMINED AFTER PROCEDURE.   If you need a refill on your cardiac medications before your next appointment, please call your pharmacy.   Coronary Angiogram With Stent Coronary angiogram with stent placement is a procedure to widen or open a narrow blood vessel of the heart (coronary artery). Arteries may become blocked by cholesterol buildup (plaques) in the lining of the wall. When a coronary artery becomes partially blocked, blood flow to that area decreases. This may lead to chest pain or a heart attack (myocardial infarction). A stent is a small piece of metal that looks like mesh or a spring. Stent placement may be done as treatment  for a heart attack or right after a coronary angiogram in which a blocked artery is found. Let your health care provider know about:  Any allergies you have.  All medicines you are taking, including vitamins, herbs, eye drops, creams, and over-the-counter medicines.  Any problems you or family members have had with  anesthetic medicines.  Any blood disorders you have.  Any surgeries you have had.  Any medical conditions you have.  Whether you are pregnant or may be pregnant. What are the risks? Generally, this is a safe procedure. However, problems may occur, including:  Damage to the heart or its blood vessels.  A return of blockage.  Bleeding, infection, or bruising at the insertion site.  A collection of blood under the skin (hematoma) at the insertion site.  A blood clot in another part of the body.  Kidney injury.  Allergic reaction to the dye or contrast that is used.  Bleeding into the abdomen (retroperitoneal bleeding).  What happens before the procedure? Staying hydrated Follow instructions from your health care provider about hydration, which may include:  Up to 2 hours before the procedure - you may continue to drink clear liquids, such as water, clear fruit juice, black coffee, and plain tea.  Eating and drinking restrictions Follow instructions from your health care provider about eating and drinking, which may include:  8 hours before the procedure - stop eating heavy meals or foods such as meat, fried foods, or fatty foods.  6 hours before the procedure - stop eating light meals or foods, such as toast or cereal.  2 hours before the procedure - stop drinking clear liquids.  Ask your health care provider about:  Changing or stopping your regular medicines. This is especially important if you are taking diabetes medicines or blood thinners.  Taking medicines such as ibuprofen. These medicines can thin your blood. Do not take these medicines before your procedure if your health care provider instructs you not to. Generally, aspirin is recommended before a procedure of passing a small, thin tube (catheter) through a blood vessel and into the heart (cardiac catheterization).  What happens during the procedure?  An IV tube will be inserted into one of your  veins.  You will be given one or more of the following: ? A medicine to help you relax (sedative). ? A medicine to numb the area where the catheter will be inserted into an artery (local anesthetic).  To reduce your risk of infection: ? Your health care team will wash or sanitize their hands. ? Your skin will be washed with soap. ? Hair may be removed from the area where the catheter will be inserted.  Using a guide wire, the catheter will be inserted into an artery. The location may be in your groin, in your wrist, or in the fold of your arm (near your elbow).  A type of X-ray (fluoroscopy) will be used to help guide the catheter to the opening of the arteries in the heart.  A dye will be injected into the catheter, and X-rays will be taken. The dye will help to show where any narrowing or blockages are located in the arteries.  A tiny wire will be guided to the blocked spot, and a balloon will be inflated to make the artery wider.  The stent will be expanded and will crush the plaques into the wall of the vessel. The stent will hold the area open and improve the blood flow. Most stents have a drug  coating to reduce the risk of the stent narrowing over time.  The artery may be made wider using a drill, laser, or other tools to remove plaques.  When the blood flow is better, the catheter will be removed. The lining of the artery will grow over the stent, which stays where it was placed. This procedure may vary among health care providers and hospitals. What happens after the procedure?  If the procedure is done through the leg, you will be kept in bed lying flat for about 6 hours. You will be instructed to not bend and not cross your legs.  The insertion site will be checked frequently.  The pulse in your foot or wrist will be checked frequently.  You may have additional blood tests, X-rays, and a test that records the electrical activity of your heart (electrocardiogram, or  ECG). This information is not intended to replace advice given to you by your health care provider. Make sure you discuss any questions you have with your health care provider. Document Released: 12/05/2002 Document Revised: 01/29/2016 Document Reviewed: 01/04/2016 Elsevier Interactive Patient Education  [employer].

## 2017-06-22 NOTE — Progress Notes (Signed)
New Outpatient Visit Date: 06/22/2017  Referring Provider: Jerrol Banana., MD [address] [place], [place] [address]  Chief Complaint: Chest pain  HPI:  Christopher Villegas is a 62 y.o. male who is being seen today as a self-referral for chest pain. He has a history of hypertension, hyperlipidemia, and carotid artery stenosis status post right carotid endarterectomy.  Over the last few years, Christopher Villegas has experienced intermittent "indigestion" that he describes as a substernal burning.  He notes that it most frequently occurs when he exerts himself and has been gradually progressing over the last few months.  He underwent cardiac workup by Dr. Nehemiah Massed in 10/2015.  Echocardiogram and myocardial perfusion stress test at that time were unrevealing.  It seemed as though his pain dissipated for several months, though it has become noticeably worse in the last 3-6 months.  He notes that walking from the parking lot now brings on discomfort.  It promptly resolves with rest.  He has not had any discomfort at rest.  He notes associated shortness of breath.  Christopher Villegas denies palpitations, lightheadedness, orthopnea, PND, and edema.  Christopher Villegas is concerned about coronary artery calcium that was previously noted on a lung cancer screening CT of the chest.  Other than the aforementioned myocardial perfusion stress test, he has never undergone additional ischemia testing.  He is currently only on ezetimibe for lipid therapy, as a mild asymptomatic increase in total CK was noted this summer with this prompted discontinuation of rosuvastatin which she had been on for many years.  Christopher Villegas underwent EGD last week for evaluation of the aforementioned "indigestion."  This was normal.  Given these findings, he is concerned that underlying cardiac disease may in fact be the cause of his pain.  --------------------------------------------------------------------------------------------------  Cardiovascular  History & Procedures: Cardiovascular Problems:  Accelerating angina  Risk Factors:  Cerebrovascular disease, hypertension, hyperlipidemia, male gender, obesity, age greater than 67, and history of tobacco use.  Cath/PCI:  None  CV Surgery:  Right carotid endarterectomy (12/2007, Dr. Lucky Cowboy)  EP Procedures and Devices:  None  Non-Invasive Evaluation(s):  Carotid Doppler (05/11/17): Patent right carotid endarterectomy site with no significant internal carotid artery stenosis.  Mild left proximal internal carotid artery disease (less than 40%).  No significant change from prior study in 04/2016.  Exercise MPI (11/12/15): No ischemia or scar.  LVEF mildly reduced at 45%.  Good exercise capacity.  TTE (11/12/15): Normal biventricular systolic function (LVEF 32%).  Mild TR and MR.  Recent CV Pertinent Labs: Lab Results  Component Value Date   CHOL 184 05/25/2017   CHOL 190 12/17/2016   HDL 49 05/25/2017   HDL 49 12/17/2016   LDLCALC 116 (H) 12/17/2016   TRIG 96 05/25/2017   CHOLHDL 3.8 05/25/2017   K 4.3 04/06/2017   BUN 27 (H) 04/06/2017   BUN 15 04/01/2016   CREATININE 1.32 (H) 04/06/2017    --------------------------------------------------------------------------------------------------  Past Medical History:  Diagnosis Date  . GERD (gastroesophageal reflux disease)     Past Surgical History:  Procedure Laterality Date  . CAROTID ENDARTERECTOMY Right 2009  . COLONOSCOPY WITH PROPOFOL N/A 06/16/2017   Procedure: COLONOSCOPY WITH PROPOFOL;  Surgeon: Lucilla Lame, MD;  Location: [facility];  Service: Endoscopy;  Laterality: N/A;  . CYST EXCISION PERINEAL    . ESOPHAGOGASTRODUODENOSCOPY (EGD) WITH PROPOFOL N/A 06/16/2017   Procedure: ESOPHAGOGASTRODUODENOSCOPY (EGD) WITH PROPOFOL;  Surgeon: Lucilla Lame, MD;  Location: [facility];  Service: Endoscopy;  Laterality: N/A;  .  POLYPECTOMY  06/16/2017   Procedure: POLYPECTOMY;  Surgeon: Lucilla Lame, MD;   Location: [facility];  Service: Endoscopy;;  . TONSILLECTOMY AND ADENOIDECTOMY      Current Meds  Medication Sig  . Cholecalciferol (VITAMIN D) 2000 UNITS tablet Take 1 tablet by mouth daily.  Marland Kitchen esomeprazole (NEXIUM) 40 MG capsule Take 1 capsule (40 mg total) by mouth daily.  Marland Kitchen ezetimibe (ZETIA) 10 MG tablet Take 1 tablet (10 mg total) by mouth daily.  Marland Kitchen losartan (COZAAR) 50 MG tablet TAKE 1 TABLET BY MOUTH DAILY    Allergies: Plavix [clopidogrel]  Social History   Socioeconomic History  . Marital status: Married    Spouse name: Corporate treasurer  . Number of children: 2  . Years of education: 31  . Highest education level: Not on file  Social Needs  . Financial resource strain: Not on file  . Food insecurity - worry: Not on file  . Food insecurity - inability: Not on file  . Transportation needs - medical: Not on file  . Transportation needs - non-medical: Not on file  Occupational History  . Occupation: [employer]  Tobacco Use  . Smoking status: Former Smoker    Packs/day: 1.00    Years: 40.00    Pack years: 40.00    Types: Cigarettes    Last attempt to quit: 09/29/2014    Years since quitting: 2.7  . Smokeless tobacco: Never Used  Substance and Sexual Activity  . Alcohol use: No    Comment: Quit 1990  . Drug use: No  . Sexual activity: Yes  Other Topics Concern  . Not on file  Social History Narrative  . Not on file    Family History  Problem Relation Age of Onset  . Cancer Mother        breast  . Hypertension Mother   . Hyperlipidemia Mother   . Arthritis Mother        osteo  . Hypertension Father   . Heart disease Father        CHF  . Kidney failure Father        causing death  . Autoimmune disease Sister   . Lupus Sister   . Healthy Son   . Healthy Son     Review of Systems: A 12-system review of systems was performed and was negative except as noted in the  HPI.  --------------------------------------------------------------------------------------------------  Physical Exam: BP (!) 147/73 (BP Location: Right Arm, Patient Position: Sitting, Cuff Size: Normal)   Pulse 83   Ht 5\' 9"  (1.753 m)   Wt 211 lb (95.7 kg)   BMI 31.16 kg/m   General: Obese man, seated comfortably in the exam room. HEENT: No conjunctival pallor or scleral icterus. Moist mucous membranes. OP clear. Neck: Supple without lymphadenopathy, thyromegaly, JVD, or HJR.  Healed right carotid endarterectomy scar. Lungs: Normal work of breathing. Clear to auscultation bilaterally without wheezes or crackles. Heart: Regular rate and rhythm without murmurs, rubs, or gallops. Non-displaced PMI. Abd: Bowel sounds present. Soft, NT/ND without hepatosplenomegaly Ext: No lower extremity edema. Radial, PT, and DP pulses are 2+ bilaterally Skin: Warm and dry without rash. Neuro: CNIII-XII intact. Strength and fine-touch sensation intact in upper and lower extremities bilaterally. Psych: Normal mood and affect.  EKG: Sinus rhythm without abnormalities.  Lab Results  Component Value Date   WBC 7.7 04/06/2017   HGB 14.6 04/06/2017   HCT 42.3 04/06/2017   MCV 89.1 04/06/2017   PLT 254 04/06/2017  Lab Results  Component Value Date   NA 137 04/06/2017   K 4.3 04/06/2017   CL 103 04/06/2017   CO2 26 04/06/2017   BUN 27 (H) 04/06/2017   CREATININE 1.32 (H) 04/06/2017   GLUCOSE 94 04/06/2017   ALT 22 04/06/2017    Lab Results  Component Value Date   CHOL 184 05/25/2017   HDL 49 05/25/2017   LDLCALC 116 (H) 12/17/2016   TRIG 96 05/25/2017   CHOLHDL 3.8 05/25/2017     --------------------------------------------------------------------------------------------------  ASSESSMENT AND PLAN: Accelerating angina Christopher Villegas reports exertional chest burning for several years, which has progressed over the last 3-6 months.  Symptoms are consistent with CCS class III angina.  EKG  today is normal.  We have discussed further evaluation options; given his multiple cardiac risk factors and high pretest probability for CAD, we have agreed to proceed directly with cardiac catheterization. I have reviewed the risks, indications, and alternatives to cardiac catheterization, possible angioplasty, and stenting with the patient. Risks include but are not limited to bleeding, infection, vascular injury, stroke, myocardial infection, arrhythmia, kidney injury, radiation-related injury in the case of prolonged fluoroscopy use, emergency cardiac surgery, and death. The patient understands the risks of serious complication is 1-2 in 6073 with diagnostic cardiac cath and 1-2% or less with angioplasty/stenting.  In the meantime, we will initiate aspirin 81 mg daily and metoprolol tartrate 25 mg twice daily.  Have also provided Christopher Villegas with a prescription for sublingual nitroglycerin to be taken as needed for chest pain.  If he has refractory chest pain, he was instructed to call 911.  Hyperlipidemia In the setting of suspected angina as well as clinical ASCVD with prior right carotid endarterectomy, Christopher Villegas warrants aggressive lipid control with an LDL less than 70.  He is currently only on ezetimibe, with a most recent LDL of 115 last month.  We have discussed risks and benefits of a retrial of statin therapy and have agreed to start rosuvastatin 5 mg daily.  I will check a CMP and total CK today.  Hypertension Blood pressure mildly elevated today.  We will initiate metoprolol tartrate 25 mg twice daily, as above.  Carotid artery disease No symptoms.  We will restart aspirin and rosuvastatin, as above.  Continue follow-up with Dr. Lucky Cowboy.  Follow-up: To be determined based on results of cardiac catheterization.  Nelva Bush, MD 06/22/2017 2:00 PM

## 2017-06-23 ENCOUNTER — Other Ambulatory Visit: Payer: Self-pay | Admitting: *Deleted

## 2017-06-23 ENCOUNTER — Telehealth: Payer: Self-pay | Admitting: Internal Medicine

## 2017-06-23 DIAGNOSIS — Z79899 Other long term (current) drug therapy: Secondary | ICD-10-CM

## 2017-06-23 DIAGNOSIS — E7800 Pure hypercholesterolemia, unspecified: Secondary | ICD-10-CM

## 2017-06-23 DIAGNOSIS — R748 Abnormal levels of other serum enzymes: Secondary | ICD-10-CM

## 2017-06-23 LAB — COMPREHENSIVE METABOLIC PANEL
ALT: 23 IU/L (ref 0–44)
AST: 28 IU/L (ref 0–40)
Albumin/Globulin Ratio: 2 (ref 1.2–2.2)
Albumin: 4.7 g/dL (ref 3.6–4.8)
Alkaline Phosphatase: 72 IU/L (ref 39–117)
BUN/Creatinine Ratio: 11 (ref 10–24)
BUN: 14 mg/dL (ref 8–27)
Bilirubin Total: 0.5 mg/dL (ref 0.0–1.2)
CO2: 20 mmol/L (ref 20–29)
Calcium: 9.1 mg/dL (ref 8.6–10.2)
Chloride: 103 mmol/L (ref 96–106)
Creatinine, Ser: 1.31 mg/dL — ABNORMAL HIGH (ref 0.76–1.27)
GFR calc Af Amer: 67 mL/min/{1.73_m2} (ref >59)
GFR calc non Af Amer: 58 mL/min/{1.73_m2} — ABNORMAL LOW (ref >59)
Globulin, Total: 2.4 g/dL (ref 1.5–4.5)
Glucose: 82 mg/dL (ref 65–99)
Potassium: 4.5 mmol/L (ref 3.5–5.2)
Sodium: 140 mmol/L (ref 134–144)
Total Protein: 7.1 g/dL (ref 6.0–8.5)

## 2017-06-23 LAB — CBC WITH DIFFERENTIAL/PLATELET
Basophils Absolute: 0.1 10*3/uL (ref 0.0–0.2)
Basos: 1 %
EOS (ABSOLUTE): 0.2 10*3/uL (ref 0.0–0.4)
Eos: 2 %
Hematocrit: 42.3 % (ref 37.5–51.0)
Hemoglobin: 14.5 g/dL (ref 13.0–17.7)
Immature Grans (Abs): 0 10*3/uL (ref 0.0–0.1)
Immature Granulocytes: 0 %
Lymphocytes Absolute: 2.7 10*3/uL (ref 0.7–3.1)
Lymphs: 31 %
MCH: 30.7 pg (ref 26.6–33.0)
MCHC: 34.3 g/dL (ref 31.5–35.7)
MCV: 89 fL (ref 79–97)
Monocytes Absolute: 0.5 10*3/uL (ref 0.1–0.9)
Monocytes: 6 %
Neutrophils Absolute: 5.3 10*3/uL (ref 1.4–7.0)
Neutrophils: 60 %
Platelets: 291 10*3/uL (ref 150–379)
RBC: 4.73 x10E6/uL (ref 4.14–5.80)
RDW: 12.9 % (ref 12.3–15.4)
WBC: 8.8 10*3/uL (ref 3.4–10.8)

## 2017-06-23 LAB — PROTIME-INR
INR: 1 (ref 0.8–1.2)
Prothrombin Time: 10.4 s (ref 9.1–12.0)

## 2017-06-23 LAB — CK TOTAL AND CKMB (NOT AT ARMC)
CK-MB Index: 2.9 ng/mL (ref 0.0–10.4)
Total CK: 245 U/L — ABNORMAL HIGH (ref 24–204)

## 2017-06-23 NOTE — Telephone Encounter (Signed)
Patient returning call lab results. 

## 2017-06-23 NOTE — Telephone Encounter (Signed)
Returned call to patient. He verbalized understanding of results of lab work from 06/22/17 and plan of care. He verbalized understanding to go to the [facility] sometime week of February 25th for repeat labs.  He would like to know what Dr End thinks is causing the elevated CK. Patient states in October prior to starting Zetia his CK was WNL but now it is elevated again with only being on Zetia. Patient wonders could Zetia be a problem. Advised patient I will route to Dr End for his advice.

## 2017-06-23 NOTE — Telephone Encounter (Signed)
Patient has additional questions for the nurse. Please call.

## 2017-06-23 NOTE — Telephone Encounter (Signed)
I spoke with Christopher Villegas, who was concerned about some lab results. Specifically, he notes that his creatinine was slightly elevated. His GFR is tolerating diminished, unchanged from 03/2017. We discussed the risks of contrast-induced nephropathy. In the setting of accelerating angina, we have agreed to proceed with coronary angiography, as planned. Creatine kinase was also noted to be mildly elevated. I have spoke with our pharmacy team about the potential for myopathy related to ezetimibe. This is extremely rare. He we will proceed with starting low-dose rosuvastatin and recheck a creatine kinase and 4-6 weeks. If myalgias develop or creatine kinase rises further, we will need to stop both medications and also consider having Christopher Villegas speak with his PCP about further evaluation for other causes of myopathy.  Nelva Bush, MD [facility] Pager: [phone]

## 2017-06-23 NOTE — Telephone Encounter (Signed)
Returned call to patient.  He is scheduled for Shingles vaccine next week the day before his procedure and wanted to make sure that was ok for him to still get.  Patient also requested Dr End to call him. He has a few questions to ask Dr End about his upcoming procedure and lab work. I asked if there was anything I could let Dr End know. Patient was very adamant about wanting Dr End to call him to discuss these questions with him directly. Patient did not give any more information and requests 5 minutes of Dr Darnelle Bos time. Routing to Dr End.

## 2017-06-30 ENCOUNTER — Telehealth: Payer: Self-pay

## 2017-06-30 ENCOUNTER — Ambulatory Visit (INDEPENDENT_AMBULATORY_CARE_PROVIDER_SITE_OTHER): Payer: BLUE CROSS/BLUE SHIELD

## 2017-06-30 VITALS — Temp 98.2°F

## 2017-06-30 DIAGNOSIS — Z23 Encounter for immunization: Secondary | ICD-10-CM

## 2017-06-30 NOTE — Telephone Encounter (Signed)
Patient contacted pre-catheterization at [facility] scheduled for:  07/01/2017 @ 1200 Verified arrival time and place:  [facility] @ 1000 Confirmed AM meds to be taken pre-cath with sip of water: Take ASA Confirmed patient has responsible person to drive home post procedure and observe patient for 24 hours:  yes Addl concerns:  none

## 2017-07-01 ENCOUNTER — Encounter (HOSPITAL_COMMUNITY)
Admission: RE | Disposition: A | Discharge status: Home / Self Care | Payer: Self-pay | Source: Ambulatory Visit | Attending: Internal Medicine

## 2017-07-01 ENCOUNTER — Ambulatory Visit (HOSPITAL_COMMUNITY)
Admission: RE | Admit: 2017-07-01 | Discharge: 2017-07-01 | Disposition: A | Discharge status: Home / Self Care | Payer: BLUE CROSS/BLUE SHIELD | Source: Ambulatory Visit | Attending: Internal Medicine | Admitting: Internal Medicine

## 2017-07-01 DIAGNOSIS — E785 Hyperlipidemia, unspecified: Secondary | ICD-10-CM | POA: Insufficient documentation

## 2017-07-01 DIAGNOSIS — I10 Essential (primary) hypertension: Secondary | ICD-10-CM | POA: Diagnosis not present

## 2017-07-01 DIAGNOSIS — Z6829 Body mass index (BMI) 29.0-29.9, adult: Secondary | ICD-10-CM | POA: Insufficient documentation

## 2017-07-01 DIAGNOSIS — Z79899 Other long term (current) drug therapy: Secondary | ICD-10-CM | POA: Insufficient documentation

## 2017-07-01 DIAGNOSIS — E669 Obesity, unspecified: Secondary | ICD-10-CM | POA: Diagnosis not present

## 2017-07-01 DIAGNOSIS — I2582 Chronic total occlusion of coronary artery: Secondary | ICD-10-CM | POA: Insufficient documentation

## 2017-07-01 DIAGNOSIS — I25119 Atherosclerotic heart disease of native coronary artery with unspecified angina pectoris: Secondary | ICD-10-CM | POA: Diagnosis not present

## 2017-07-01 DIAGNOSIS — Z87891 Personal history of nicotine dependence: Secondary | ICD-10-CM | POA: Insufficient documentation

## 2017-07-01 DIAGNOSIS — I25110 Atherosclerotic heart disease of native coronary artery with unstable angina pectoris: Secondary | ICD-10-CM | POA: Diagnosis not present

## 2017-07-01 DIAGNOSIS — I200 Unstable angina: Secondary | ICD-10-CM | POA: Diagnosis present

## 2017-07-01 HISTORY — PX: LEFT HEART CATH AND CORONARY ANGIOGRAPHY: CATH118249

## 2017-07-01 SURGERY — LEFT HEART CATH AND CORONARY ANGIOGRAPHY
Anesthesia: LOCAL

## 2017-07-01 MED ORDER — SODIUM CHLORIDE 0.9% FLUSH: 3.0000 mL | Freq: Two times a day (BID) | INTRAVENOUS | Status: DC

## 2017-07-01 MED ORDER — NITROGLYCERIN 1 MG/10 ML FOR IR/CATH LAB
INTRA_ARTERIAL | Status: DC | PRN
  Administered 2017-07-01: 200 ug via INTRACORONARY

## 2017-07-01 MED ORDER — MIDAZOLAM HCL 2 MG/2ML IJ SOLN
INTRAMUSCULAR | Status: DC | PRN
  Administered 2017-07-01: 1 mg via INTRAVENOUS

## 2017-07-01 MED ORDER — FENTANYL CITRATE (PF) 100 MCG/2ML IJ SOLN
INTRAMUSCULAR | Status: AC
  Filled 2017-07-01: qty 2

## 2017-07-01 MED ORDER — HEPARIN (PORCINE) IN NACL 2-0.9 UNIT/ML-% IJ SOLN
INTRAMUSCULAR | Status: DC | PRN
  Administered 2017-07-01: 10 mL via INTRA_ARTERIAL

## 2017-07-01 MED ORDER — VERAPAMIL HCL 2.5 MG/ML IV SOLN
INTRAVENOUS | Status: AC
  Filled 2017-07-01: qty 2

## 2017-07-01 MED ORDER — IOPAMIDOL (ISOVUE-370) INJECTION 76%
INTRAVENOUS | Status: DC | PRN
  Administered 2017-07-01: 60 mL via INTRA_ARTERIAL

## 2017-07-01 MED ORDER — ASPIRIN 81 MG PO CHEW: 81.0000 mg | CHEWABLE_TABLET | ORAL | Status: DC

## 2017-07-01 MED ORDER — SODIUM CHLORIDE 0.9 % IV SOLN: INTRAVENOUS | Status: DC

## 2017-07-01 MED ORDER — MIDAZOLAM HCL 2 MG/2ML IJ SOLN
INTRAMUSCULAR | Status: AC
  Filled 2017-07-01: qty 2

## 2017-07-01 MED ORDER — HEPARIN (PORCINE) IN NACL 2-0.9 UNIT/ML-% IJ SOLN
INTRAMUSCULAR | Status: AC
  Filled 2017-07-01: qty 1000

## 2017-07-01 MED ORDER — SODIUM CHLORIDE 0.9% FLUSH: 3.0000 mL | INTRAVENOUS | Status: DC | PRN

## 2017-07-01 MED ORDER — HEPARIN SODIUM (PORCINE) 1000 UNIT/ML IJ SOLN
INTRAMUSCULAR | Status: AC
  Filled 2017-07-01: qty 1

## 2017-07-01 MED ORDER — NITROGLYCERIN 1 MG/10 ML FOR IR/CATH LAB
INTRA_ARTERIAL | Status: AC
  Filled 2017-07-01: qty 10

## 2017-07-01 MED ORDER — HEPARIN SODIUM (PORCINE) 1000 UNIT/ML IJ SOLN
INTRAMUSCULAR | Status: DC | PRN
  Administered 2017-07-01: 4500 [IU] via INTRAVENOUS

## 2017-07-01 MED ORDER — ISOSORBIDE MONONITRATE ER 30 MG PO TB24: 30.0000 mg | ORAL_TABLET | Freq: Every day | ORAL | 11 refills | Status: DC

## 2017-07-01 MED ORDER — SODIUM CHLORIDE 0.9 % WEIGHT BASED INFUSION
3.0000 mL/kg/h | INTRAVENOUS | Status: AC
  Administered 2017-07-01: 3 mL/kg/h via INTRAVENOUS

## 2017-07-01 MED ORDER — SODIUM CHLORIDE 0.9 % IV SOLN: 250.0000 mL | INTRAVENOUS | Status: DC | PRN

## 2017-07-01 MED ORDER — HEPARIN (PORCINE) IN NACL 2-0.9 UNIT/ML-% IJ SOLN
INTRAMUSCULAR | Status: AC | PRN
  Administered 2017-07-01: 1000 mL via INTRA_ARTERIAL

## 2017-07-01 MED ORDER — FENTANYL CITRATE (PF) 100 MCG/2ML IJ SOLN
INTRAMUSCULAR | Status: DC | PRN
  Administered 2017-07-01: 50 ug via INTRAVENOUS

## 2017-07-01 MED ORDER — IOPAMIDOL (ISOVUE-370) INJECTION 76%
INTRAVENOUS | Status: AC
  Filled 2017-07-01: qty 100

## 2017-07-01 MED ORDER — SODIUM CHLORIDE 0.9 % WEIGHT BASED INFUSION: 1.0000 mL/kg/h | INTRAVENOUS | Status: DC

## 2017-07-01 MED ORDER — LIDOCAINE HCL (PF) 1 % IJ SOLN
INTRAMUSCULAR | Status: DC | PRN
  Administered 2017-07-01: 2 mL via INTRADERMAL

## 2017-07-01 MED ORDER — LIDOCAINE HCL (PF) 1 % IJ SOLN
INTRAMUSCULAR | Status: AC
  Filled 2017-07-01: qty 30

## 2017-07-01 SURGICAL SUPPLY — 9 items
CATH IMPULSE 5F ANG/FL3.5 (CATHETERS) ×2 IMPLANT
DEVICE RAD COMP TR BAND LRG (VASCULAR PRODUCTS) ×2 IMPLANT
GLIDESHEATH SLEND SS 6F .021 (SHEATH) ×2 IMPLANT
GUIDEWIRE INQWIRE 1.5J.035X260 (WIRE) ×1 IMPLANT
INQWIRE 1.5J .035X260CM (WIRE) ×2
KIT HEART LEFT (KITS) ×2 IMPLANT
PACK CARDIAC CATHETERIZATION (CUSTOM PROCEDURE TRAY) ×2 IMPLANT
TRANSDUCER W/STOPCOCK (MISCELLANEOUS) ×2 IMPLANT
TUBING CIL FLEX 10 FLL-RA (TUBING) ×2 IMPLANT

## 2017-07-01 NOTE — Research (Signed)
OPTIMIZE Informed Consent   Subject Name: Jeson Camacho II  Subject met inclusion and exclusion criteria.  The informed consent form, study requirements and expectations were reviewed with the subject and questions and concerns were addressed prior to the signing of the consent form.  The subject verbalized understanding of the trail requirements.  The subject agreed to participate in the OPTIMIZE trial and signed the informed consent.  The informed consent was obtained prior to performance of any protocol-specific procedures for the subject.  A copy of the signed informed consent was given to the subject and a copy was placed in the subject's medical record.  Hedrick,Hawley Pavia W 07/01/2017, 11:12 AM

## 2017-07-01 NOTE — Discharge Instructions (Signed)

## 2017-07-01 NOTE — Interval H&P Note (Signed)
History and Physical Interval Note:  07/01/2017 11:47 AM  Christopher Villegas  has presented today for surgery, with the diagnosis of accelerating angina. The various methods of treatment have been discussed with the patient and family. After consideration of risks, benefits and other options for treatment, the patient has consented to  Procedure(s): LEFT HEART CATH AND CORONARY ANGIOGRAPHY (N/A) as a surgical intervention .  The patient's history has been reviewed, patient examined, no change in status, stable for surgery.  I have reviewed the patient's chart and labs.  Questions were answered to the patient's satisfaction.    Cath Lab Visit (complete for each Cath Lab visit)  Clinical Evaluation Leading to the Procedure:   ACS: No.  Non-ACS:    Anginal Classification: CCS III  Anti-ischemic medical therapy: Minimal Therapy (1 class of medications)  Non-Invasive Test Results: No non-invasive testing performed  Prior CABG: No previous CABG  Hardeep Reetz

## 2017-07-01 NOTE — Research (Signed)
CAD FEM Informed Consent   Subject Name: Christopher Villegas  Subject met inclusion and exclusion criteria.  The informed consent form, study requirements and expectations were reviewed with the subject and questions and concerns were addressed prior to the signing of the consent form.  The subject verbalized understanding of the trail requirements.  The subject agreed to participate in the CAD FEM trial and signed the informed consent.  The informed consent was obtained prior to performance of any protocol-specific procedures for the subject.  A copy of the signed informed consent was given to the subject and a copy was placed in the subject's medical record.  Hedrick,Tarra Pence W 07/01/2017, 1100

## 2017-07-04 ENCOUNTER — Encounter (HOSPITAL_COMMUNITY): Payer: Self-pay | Admitting: Internal Medicine

## 2017-07-05 ENCOUNTER — Other Ambulatory Visit: Payer: Self-pay | Admitting: Internal Medicine

## 2017-07-05 ENCOUNTER — Telehealth: Payer: Self-pay | Admitting: Internal Medicine

## 2017-07-05 NOTE — Telephone Encounter (Signed)
S/w patient. He expressed concern with seeing an NP instead of Dr End for a f/u appointment following recent heart cath. He would prefer to have appointment with Dr End who did the procedure. He would like to be able to ask him specific questions regarding procedure. Discussed with Dr End and ok to add on to appointment time in February. Patient scheduled for 07/20/17 to see Dr End. Patient very appreciative.

## 2017-07-05 NOTE — Telephone Encounter (Signed)
Pt would like to speak with Dr. Saunders Revel, pt states he has some questions regarding his medications, and the cath he has just had. Pt is concerned that he has to wait 3-4 weeks to be seen after his cath, and he is not very happy he has to see a Designer, jewellery.

## 2017-07-05 NOTE — Progress Notes (Signed)
Shingrix given.

## 2017-07-18 ENCOUNTER — Other Ambulatory Visit: Payer: Self-pay | Admitting: Family Medicine

## 2017-07-19 NOTE — Progress Notes (Signed)
Follow-up Outpatient Visit Date: 07/20/2017  Primary Care Provider: Jerrol Banana., MD [address] [place] [address]  Chief Complaint: Coronary artery disease and recent cardiac catheterization  HPI:  Mr. Hausen is a 62 y.o. year-old male with history of coronary artery disease with chronic total occlusion of mid RCA discovered in 06/2017, hypertension, hyperlipidemia, and carotid artery stenosis status post right carotid endarterectomy, who presents for follow-up of coronary artery disease. I met Mr. Crisanto a month ago, at which time he noted intermittent substernal burning that most commonly occurred with exertion. Given his risk factors, we elected to proceed with cardiac catheterization (see details below). Medical therapy, including isosorbide mononitrate and rosuvastatin were recommended.  Today, Mr. Alesi reports feeling notably better than at our last visit.  He was initially a little bit fatigued after his cardiac catheterization, though he notes that his energy is back to normal.  Since starting isosorbide mononitrate, his previously described exertional chest burning has no longer been present.  Other than mild occipital headache that improves throughout the day, Mr. Bumbaugh has tolerated the medication well.  He also is tolerating low-dose rosuvastatin well.  He denies further chest pain, palpitations, lightheadedness, orthopnea, and edema.  --------------------------------------------------------------------------------------------------  Cardiovascular History & Procedures: Cardiovascular Problems:  Stable angina  Risk Factors:  Cerebrovascular disease, hypertension, hyperlipidemia, male gender, obesity, age greater than 76, and history of tobacco use.  Cath/PCI:  LHC (07/01/17): LMCA with 30% ostial and distal lesions. LAD with 30% midvessel stenosis. LCX with 20% ostial and 40% proximal lesions. RCA with 40% diffuse midstenosis and chronic total  occlusion of the mid/distal RCA. Left-to-right and right-to-right collaterals are present  CV Surgery:  Right carotid endarterectomy (12/2007, Dr. Lucky Cowboy)  EP Procedures and Devices:  None  Non-Invasive Evaluation(s):  Carotid Doppler (05/11/17): Patent right carotid endarterectomy site with no significant internal carotid artery stenosis.  Mild left proximal internal carotid artery disease (less than 40%).  No significant change from prior study in 04/2016.  Exercise MPI (11/12/15): No ischemia or scar.  LVEF mildly reduced at 45%.  Good exercise capacity.  TTE (11/12/15): Normal biventricular systolic function (LVEF 26%).  Mild TR and MR.  Recent CV Pertinent Labs: Lab Results  Component Value Date   CHOL 184 05/25/2017   CHOL 190 12/17/2016   HDL 49 05/25/2017   HDL 49 12/17/2016   LDLCALC 116 (H) 12/17/2016   TRIG 96 05/25/2017   CHOLHDL 3.8 05/25/2017   INR 1.0 06/22/2017   K 4.5 06/22/2017   BUN 14 06/22/2017   CREATININE 1.31 (H) 06/22/2017   CREATININE 1.32 (H) 04/06/2017    Past medical and surgical history were reviewed and updated in EPIC.  Current Meds  Medication Sig  . aspirin EC 81 MG tablet Take 1 tablet (81 mg total) by mouth daily.  . Cholecalciferol (VITAMIN D) 2000 UNITS tablet Take 2,000 Units by mouth daily.   Marland Kitchen esomeprazole (NEXIUM) 40 MG capsule Take 1 capsule (40 mg total) by mouth daily.  Marland Kitchen ezetimibe (ZETIA) 10 MG tablet TAKE 1 TABLET DAILY  . ibuprofen (ADVIL,MOTRIN) 200 MG tablet Take 600 mg by mouth daily as needed for moderate pain.  . isosorbide mononitrate (IMDUR) 30 MG 24 hr tablet Take 1 tablet (30 mg total) by mouth daily.  Marland Kitchen losartan (COZAAR) 50 MG tablet TAKE 1 TABLET BY MOUTH DAILY  . metoprolol tartrate (LOPRESSOR) 25 MG tablet Take 1 tablet (25 mg total) by mouth 2 (two) times daily.  Marland Kitchen  nitroGLYCERIN (NITROSTAT) 0.4 MG SL tablet Place 1 tablet (0.4 mg total) under the tongue every 5 (five) minutes as needed for chest pain. For a  maximum of 3 doses.  . rosuvastatin (CRESTOR) 5 MG tablet Take 1 tablet (5 mg total) by mouth daily.    Allergies: Plavix [clopidogrel]  Social History   Socioeconomic History  . Marital status: Married    Spouse name: Corporate treasurer  . Number of children: 2  . Years of education: 23  . Highest education level: Not on file  Social Needs  . Financial resource strain: Not on file  . Food insecurity - worry: Not on file  . Food insecurity - inability: Not on file  . Transportation needs - medical: Not on file  . Transportation needs - non-medical: Not on file  Occupational History  . Occupation: [employer]  Tobacco Use  . Smoking status: Former Smoker    Packs/day: 1.50    Years: 45.00    Pack years: 67.50    Types: Cigarettes    Last attempt to quit: 09/29/2014    Years since quitting: 2.8  . Smokeless tobacco: Never Used  Substance and Sexual Activity  . Alcohol use: No    Comment: Quit 1990  . Drug use: No  . Sexual activity: Yes  Other Topics Concern  . Not on file  Social History Narrative  . Not on file    Family History  Problem Relation Age of Onset  . Cancer Mother        breast  . Hypertension Mother   . Hyperlipidemia Mother   . Arthritis Mother        osteo  . Hypertension Father   . Heart disease Father        CHF  . Kidney failure Father        causing death  . Autoimmune disease Sister   . Lupus Sister   . Healthy Son   . Healthy Son     Review of Systems: A 12-system review of systems was performed and was negative except as noted in the HPI.  --------------------------------------------------------------------------------------------------  Physical Exam: BP 120/60 (BP Location: Left Arm, Patient Position: Sitting, Cuff Size: Normal)   Pulse 63   Ht '5\' 9"'$  (1.753 m)   Wt 200 lb 8 oz (90.9 kg)   BMI 29.61 kg/m   General:  Overweight man, seated comfortably in the exam room. HEENT: No conjunctival pallor or scleral  icterus. Moist mucous membranes.  OP clear. Neck: Supple without lymphadenopathy, thyromegaly, JVD, or HJR. Lungs: Normal work of breathing. Clear to auscultation bilaterally without wheezes or crackles. Heart: Regular rate and rhythm without murmurs, rubs, or gallops. Non-displaced PMI. Abd: Bowel sounds present. Soft, NT/ND without hepatosplenomegaly Ext: No lower extremity edema. Right radial arteriotomy site is well-healed with 2+ pulse. Skin: Warm and dry without rash.  EKG:  NSR without abnormalities.  Lab Results  Component Value Date   WBC 8.8 06/22/2017   HGB 14.5 06/22/2017   HCT 42.3 06/22/2017   MCV 89 06/22/2017   PLT 291 06/22/2017    Lab Results  Component Value Date   NA 140 06/22/2017   K 4.5 06/22/2017   CL 103 06/22/2017   CO2 20 06/22/2017   BUN 14 06/22/2017   CREATININE 1.31 (H) 06/22/2017   GLUCOSE 82 06/22/2017   ALT 23 06/22/2017    Lab Results  Component Value Date   CHOL 184 05/25/2017   HDL 49 05/25/2017  [place] 116 (H) 12/17/2016   TRIG 96 05/25/2017   CHOLHDL 3.8 05/25/2017    --------------------------------------------------------------------------------------------------  ASSESSMENT AND PLAN: Coronary artery disease with stable angina Mr. Bruschi is doing well following recent cardiac catheterization that demonstrated chronic total occlusion of the mid RCA and mild to moderate LAD and LCx disease.  His angina is well controlled with metoprolol and isosorbide mononitrate, which we will continue at current doses.  I will also continue low-dose aspirin and lipid therapy, as outlined below.  We spent a long time discussing treatment options for chronic total occlusions and the rationale behind medical therapy.  I have stressed the importance of secondary prevention to prevent progression of his CAD.  Hyperlipidemia Goal LDL is less than 70; most recent LDL was 115 in 05/2017.  He is on rosuvastatin 5 mg daily, which he is tolerating well.   Total CK was noted to be mildly elevated even before starting rosuvastatin.  We will plan to recheck a lipid panel, ALT, and CK in 2 weeks.  Ultimately, my goal would be to escalate rosuvastatin to at least 20 mg daily and discontinue ezetimibe.  GERD Given resolution of chest pain with addition of isosorbide mononitrate and recent negative EGD, I suspect that burning chest pain was most likely angina rather than acid reflux.  I think it is reasonable for Mr. Uselman to discontinue Nexium.  Essential hypertension Blood pressure well controlled today.  No medication changes at this time.  Follow-up: Return to clinic in 3 months.  Nelva Bush, MD 07/20/2017 3:39 PM

## 2017-07-20 ENCOUNTER — Ambulatory Visit: Payer: BLUE CROSS/BLUE SHIELD | Admitting: Internal Medicine

## 2017-07-20 ENCOUNTER — Encounter: Payer: Self-pay | Admitting: Internal Medicine

## 2017-07-20 VITALS — BP 120/60 | HR 63 | Ht 69.0 in | Wt 200.5 lb

## 2017-07-20 DIAGNOSIS — K219 Gastro-esophageal reflux disease without esophagitis: Secondary | ICD-10-CM

## 2017-07-20 DIAGNOSIS — I25118 Atherosclerotic heart disease of native coronary artery with other forms of angina pectoris: Secondary | ICD-10-CM | POA: Diagnosis not present

## 2017-07-20 DIAGNOSIS — E785 Hyperlipidemia, unspecified: Secondary | ICD-10-CM

## 2017-07-20 DIAGNOSIS — I10 Essential (primary) hypertension: Secondary | ICD-10-CM | POA: Diagnosis not present

## 2017-07-20 NOTE — Patient Instructions (Addendum)
Medication Instructions:  Your physician has recommended you make the following change in your medication:  1- STOP Nexium.   Labwork: Your physician recommends that you return for lab work ON OR AROUND February 25TH. - LIPID, ALT, CK. - Please go to the [facility]. You will check in at the front desk to the right as you walk into the atrium. Valet Parking is offered if needed.  - You will need to be fasting. - No appointment needed.  Testing/Procedures: none   Follow-Up: Your physician recommends that you schedule a follow-up appointment in: 3 MONTHS WITH DR END.   If you need a refill on your cardiac medications before your next appointment, please call your pharmacy.

## 2017-07-27 ENCOUNTER — Ambulatory Visit: Payer: BLUE CROSS/BLUE SHIELD | Admitting: Nurse Practitioner

## 2017-08-08 ENCOUNTER — Other Ambulatory Visit
Admission: RE | Admit: 2017-08-08 | Discharge: 2017-08-08 | Disposition: A | Discharge status: Home / Self Care | Payer: BLUE CROSS/BLUE SHIELD | Source: Ambulatory Visit | Attending: Internal Medicine | Admitting: Internal Medicine

## 2017-08-08 DIAGNOSIS — R748 Abnormal levels of other serum enzymes: Secondary | ICD-10-CM | POA: Diagnosis not present

## 2017-08-08 DIAGNOSIS — E7800 Pure hypercholesterolemia, unspecified: Secondary | ICD-10-CM | POA: Diagnosis not present

## 2017-08-08 DIAGNOSIS — Z79899 Other long term (current) drug therapy: Secondary | ICD-10-CM

## 2017-08-08 LAB — ALT: ALT: 25 U/L (ref 17–63)

## 2017-08-08 LAB — LIPID PANEL
Cholesterol: 108 mg/dL (ref 0–200)
HDL: 45 mg/dL (ref >40)
LDL Cholesterol: 52 mg/dL (ref 0–99)
Total CHOL/HDL Ratio: 2.4 RATIO
Triglycerides: 57 mg/dL (ref <150)
VLDL: 11 mg/dL (ref 0–40)

## 2017-08-08 LAB — CK: Total CK: 222 U/L (ref 49–397)

## 2017-08-19 ENCOUNTER — Encounter: Payer: Self-pay | Admitting: Internal Medicine

## 2017-08-31 ENCOUNTER — Ambulatory Visit: Payer: Self-pay | Admitting: Family Medicine

## 2017-09-07 ENCOUNTER — Ambulatory Visit (INDEPENDENT_AMBULATORY_CARE_PROVIDER_SITE_OTHER): Payer: BLUE CROSS/BLUE SHIELD

## 2017-09-07 DIAGNOSIS — Z23 Encounter for immunization: Secondary | ICD-10-CM

## 2017-10-20 ENCOUNTER — Encounter: Payer: Self-pay | Admitting: Internal Medicine

## 2017-10-20 ENCOUNTER — Ambulatory Visit: Payer: BLUE CROSS/BLUE SHIELD | Admitting: Internal Medicine

## 2017-10-20 VITALS — BP 129/77 | HR 57 | Ht 69.0 in | Wt 188.5 lb

## 2017-10-20 DIAGNOSIS — I25118 Atherosclerotic heart disease of native coronary artery with other forms of angina pectoris: Secondary | ICD-10-CM

## 2017-10-20 DIAGNOSIS — E785 Hyperlipidemia, unspecified: Secondary | ICD-10-CM | POA: Diagnosis not present

## 2017-10-20 DIAGNOSIS — N183 Chronic kidney disease, stage 3 unspecified: Secondary | ICD-10-CM

## 2017-10-20 DIAGNOSIS — I10 Essential (primary) hypertension: Secondary | ICD-10-CM | POA: Diagnosis not present

## 2017-10-20 NOTE — Progress Notes (Signed)
Follow-up Outpatient Visit Date: 10/20/2017  Primary Care Provider: Jerrol Banana., MD [address] [place] [address]  Chief Complaint: Follow-up coronary artery disease  HPI:  Mr. Brumett is a 62 y.o. year-old male with history of coronary artery disease with chronic total occlusion of mid RCA discovered in 06/2017, hypertension, hyperlipidemia, and carotid artery stenosis status post right carotid endarterectomy, who presents for follow-up of coronary artery disease.  I last saw Mr. Riggenbach in early February, at which time he was doing much better after initiation of isosorbide mononitrate and rosuvastatin for his recently discovered coronary artery disease.  Today, Mr. Meras continues to feel very well.  He notes occasional myalgias, that are no worse since adding rosuvastatin.  He denies chest pain, shortness of breath, palpitations, lightheadedness, orthopnea, PND, and edema he is exercising regularly and has lost over 20 pounds since we first met in January.  He is able to exercise at least 120 minutes a week.  --------------------------------------------------------------------------------------------------  Cardiovascular History & Procedures: Cardiovascular Problems:  Coronary artery disease with stable angina  Risk Factors:  Cerebrovascular disease, hypertension, hyperlipidemia, male gender, obesity, age greater than 52, and history of tobacco use.  Cath/PCI:  LHC (07/01/17): LMCA with 30% ostial and distal lesions. LAD with 30% midvessel stenosis. LCX with 20% ostial and 40% proximal lesions. RCA with 40% diffuse midstenosis and chronic total occlusion of the mid/distal RCA. Left-to-right and right-to-right collaterals are present  CV Surgery:  Right carotid endarterectomy (12/2007, Dr. Lucky Cowboy)  EP Procedures and Devices:  None  Non-Invasive Evaluation(s):  Carotid Doppler (05/11/17): Patent right carotid endarterectomy site with no significant  internal carotid artery stenosis. Mild left proximal internal carotid artery disease (less than 40%). No significant change from prior study in 04/2016.  Exercise MPI (11/12/15): No ischemia or scar. LVEF mildly reduced at 45%. Good exercise capacity.  TTE (11/12/15): Normal biventricular systolic function (LVEF 56%). Mild TR and MR.   Recent CV Pertinent Labs: Lab Results  Component Value Date   CHOL 108 08/08/2017   CHOL 190 12/17/2016   HDL 45 08/08/2017   HDL 49 12/17/2016   LDLCALC 52 08/08/2017   LDLCALC 115 (H) 05/25/2017   TRIG 57 08/08/2017   CHOLHDL 2.4 08/08/2017   INR 1.0 06/22/2017   K 4.5 06/22/2017   BUN 14 06/22/2017   CREATININE 1.31 (H) 06/22/2017   CREATININE 1.32 (H) 04/06/2017    Past medical and surgical history were reviewed and updated in EPIC.  Current Meds  Medication Sig  . aspirin EC 81 MG tablet Take 1 tablet (81 mg total) by mouth daily.  . Cholecalciferol (VITAMIN D) 2000 UNITS tablet Take 2,000 Units by mouth daily.   Marland Kitchen ezetimibe (ZETIA) 10 MG tablet TAKE 1 TABLET DAILY  . ibuprofen (ADVIL,MOTRIN) 200 MG tablet Take 600 mg by mouth daily as needed for moderate pain.  . isosorbide mononitrate (IMDUR) 30 MG 24 hr tablet Take 1 tablet (30 mg total) by mouth daily.  Marland Kitchen losartan (COZAAR) 50 MG tablet TAKE 1 TABLET BY MOUTH DAILY  . metoprolol tartrate (LOPRESSOR) 25 MG tablet Take 1 tablet (25 mg total) by mouth 2 (two) times daily.  . nitroGLYCERIN (NITROSTAT) 0.4 MG SL tablet Place 1 tablet (0.4 mg total) under the tongue every 5 (five) minutes as needed for chest pain. For a maximum of 3 doses.  . rosuvastatin (CRESTOR) 5 MG tablet Take 1 tablet (5 mg total) by mouth daily.    Allergies: Plavix [clopidogrel]  Social History   Tobacco Use  . Smoking status: Former Smoker    Packs/day: 1.50    Years: 45.00    Pack years: 67.50    Types: Cigarettes    Last attempt to quit: 09/29/2014    Years since quitting: 3.0  . Smokeless tobacco:  Never Used  Substance Use Topics  . Alcohol use: No    Comment: Quit 1990  . Drug use: No    Family History  Problem Relation Age of Onset  . Cancer Mother        breast  . Hypertension Mother   . Hyperlipidemia Mother   . Arthritis Mother        osteo  . Hypertension Father   . Heart disease Father        CHF  . Kidney failure Father        causing death  . Autoimmune disease Sister   . Lupus Sister   . Healthy Son   . Healthy Son     Review of Systems: A 12-system review of systems was performed and was negative except as noted in the HPI.  --------------------------------------------------------------------------------------------------  Physical Exam: BP 129/77 (BP Location: Left Arm, Patient Position: Sitting, Cuff Size: Normal)   Pulse (!) 57   Ht '5\' 9"'$  (1.753 m)   Wt 188 lb 8 oz (85.5 kg)   BMI 27.84 kg/m   General: NAD. HEENT: No conjunctival pallor or scleral icterus. Moist mucous membranes.  OP clear. Neck: Supple without lymphadenopathy, thyromegaly, JVD, or HJR. No carotid bruit. Lungs: Normal work of breathing. Clear to auscultation bilaterally without wheezes or crackles. Heart: Bradycardic but regular without murmurs, rubs, or gallops.  Nondisplaced PMI. Abd: Bowel sounds present. Soft, NT/ND without hepatosplenomegaly Ext: No lower extremity edema. Radial, PT, and DP pulses are 2+ bilaterally. Skin: Warm and dry without rash.  EKG: Sinus bradycardia (heart rate 57 bpm).  No significant abnormalities.  Lab Results  Component Value Date   WBC 8.8 06/22/2017   HGB 14.5 06/22/2017   HCT 42.3 06/22/2017   MCV 89 06/22/2017   PLT 291 06/22/2017    Lab Results  Component Value Date   NA 140 06/22/2017   K 4.5 06/22/2017   CL 103 06/22/2017   CO2 20 06/22/2017   BUN 14 06/22/2017   CREATININE 1.31 (H) 06/22/2017   GLUCOSE 82 06/22/2017   ALT 25 08/08/2017    Lab Results  Component Value Date   CHOL 108 08/08/2017   HDL 45 08/08/2017    LDLCALC 52 08/08/2017   TRIG 57 08/08/2017   CHOLHDL 2.4 08/08/2017    --------------------------------------------------------------------------------------------------  ASSESSMENT AND PLAN: Coronary artery disease with stable angina Symptoms very well controlled with current regimen of metoprolol and isosorbide mononitrate.  We will continue with secondary prevention to prevent progression of disease.  I encouraged Mr. Zwiefelhofer to continue exercising.  Hypertension Blood pressure well controlled today.  Continue metoprolol and losartan.  Hyperlipidemia Mr. Ahart is tolerating rosuvastatin and ezetimibe well.  Given history of elevated creatine kinase in the past, we will repeat a CK level today.  Chronic kidney disease stage III Mildly reduced creatinine noted prior to catheterization in January.  Given ongoing treatment with losartan, we will recheck a basic metabolic panel today to ensure that this is stable.  Continue with blood pressure control, as above.  Follow-up: Return to clinic in 6 months.  Nelva Bush, MD 10/20/2017 11:28 AM

## 2017-10-20 NOTE — Patient Instructions (Signed)
Labwork: BMP and CK done today and we will call you with results.   Follow-Up: Your physician wants you to follow-up in: 6 months with Dr. Saunders Revel. You will receive a reminder letter in the mail two months in advance. If you don't receive a letter, please call our office to schedule the follow-up appointment.  It was a pleasure seeing you today here in the office. Please do not hesitate to give [place] a call back if you have any further questions. [place], BSN

## 2017-10-21 LAB — BASIC METABOLIC PANEL
BUN/Creatinine Ratio: 12 (ref 10–24)
BUN: 15 mg/dL (ref 8–27)
CO2: 22 mmol/L (ref 20–29)
Calcium: 9 mg/dL (ref 8.6–10.2)
Chloride: 104 mmol/L (ref 96–106)
Creatinine, Ser: 1.27 mg/dL (ref 0.76–1.27)
GFR calc Af Amer: 69 mL/min/{1.73_m2} (ref >59)
GFR calc non Af Amer: 60 mL/min/{1.73_m2} (ref >59)
Glucose: 87 mg/dL (ref 65–99)
Potassium: 4.8 mmol/L (ref 3.5–5.2)
Sodium: 139 mmol/L (ref 134–144)

## 2017-10-21 LAB — CK: Total CK: 213 U/L — ABNORMAL HIGH (ref 24–204)

## 2017-12-14 DIAGNOSIS — L570 Actinic keratosis: Secondary | ICD-10-CM | POA: Diagnosis not present

## 2017-12-14 DIAGNOSIS — L708 Other acne: Secondary | ICD-10-CM | POA: Diagnosis not present

## 2017-12-14 DIAGNOSIS — D485 Neoplasm of uncertain behavior of skin: Secondary | ICD-10-CM | POA: Diagnosis not present

## 2017-12-14 DIAGNOSIS — Z85828 Personal history of other malignant neoplasm of skin: Secondary | ICD-10-CM | POA: Diagnosis not present

## 2018-02-22 ENCOUNTER — Other Ambulatory Visit: Payer: Self-pay | Admitting: Family Medicine

## 2018-02-22 DIAGNOSIS — I10 Essential (primary) hypertension: Secondary | ICD-10-CM

## 2018-03-14 ENCOUNTER — Telehealth: Payer: Self-pay

## 2018-03-14 MED ORDER — ROSUVASTATIN CALCIUM 5 MG PO TABS: 5.0000 mg | ORAL_TABLET | Freq: Every day | ORAL | 3 refills | Status: DC

## 2018-03-14 NOTE — Telephone Encounter (Signed)
Refill sent for Crestor 5 mg

## 2018-03-22 ENCOUNTER — Other Ambulatory Visit: Payer: Self-pay | Admitting: Family Medicine

## 2018-03-22 DIAGNOSIS — I10 Essential (primary) hypertension: Secondary | ICD-10-CM

## 2018-03-27 ENCOUNTER — Telehealth: Payer: Self-pay | Admitting: *Deleted

## 2018-03-27 NOTE — Telephone Encounter (Signed)
Received referral for low dose lung cancer screening CT scan.  Message left at phone number listed in EMR for patient to call either myself or Shawn Perkins back at [phone] to facilitate scheduling the scan.    

## 2018-03-28 ENCOUNTER — Telehealth: Payer: Self-pay | Admitting: *Deleted

## 2018-03-28 DIAGNOSIS — Z122 Encounter for screening for malignant neoplasm of respiratory organs: Secondary | ICD-10-CM

## 2018-03-28 NOTE — Telephone Encounter (Signed)
Patient has been notified that the annual lung cancer screening low dose CT scan is due currently or will be in the near future.  Confirmed that the patient is within the age range of 43-80, and asymptomatic, and currently exhibits no signs or symptoms of lung cancer.  Patient denies illness that would prevent curative treatment for lung cancer if found.  Verified smoking history, former smoker quit 2016 67.5 pkyr history .  The shared decision making visit was completed on 03-16-17.  Patient is agreeable for the CT scan to be scheduled.  Will call patient back with date and time of appointment.

## 2018-04-04 ENCOUNTER — Telehealth: Payer: Self-pay | Admitting: *Deleted

## 2018-04-04 NOTE — Telephone Encounter (Signed)
Called pt to inform of ldct screening appt, Friday 04/21/2018 @ 9:10am here @ [facility].voiced understanding.  appt mailed for reminder

## 2018-04-18 NOTE — Progress Notes (Signed)
Follow-up Outpatient Visit Date: 04/19/2018  Primary Care Provider: Jerrol Banana., MD [address] [place] [address]  Chief Complaint: Follow-up coronary artery disease  HPI:  Christopher Villegas is a 62 y.o. year-old male with history of artery disease with chronic total occlusion of mid RCA discovered in 06/2017,hypertension, hyperlipidemia, and carotid artery stenosis status post right carotid endarterectomy, who presents for follow-up of coronary artery disease with stable angina.  I last saw him in May, at which time he was doing well.  His only concern was for intermittent myalgias that were not any different with the addition of rosuvastatin.  Today, [name] reports that he continues to feel great.  He is exercising regularly without chest pain or shortness of breath.  He notes continued myalgias and joint pains, which have been long-standing.  He remains on rosuvastatin.  He denies palpitations, lightheadedness, orthopnea, PND, and edema.  He bruises easily but has not had any significant bleeding.  --------------------------------------------------------------------------------------------------  Cardiovascular History & Procedures: Cardiovascular Problems:  Coronary artery disease with stableangina  Risk Factors:  Cerebrovascular disease, hypertension, hyperlipidemia, male gender, obesity, age greater than 13, and history of tobacco use.  Cath/PCI:  LHC (07/01/17): LMCA with 30% ostial and distal lesions. LAD with 30% midvessel stenosis. LCX with 20% ostial and 40% proximal lesions. RCA with 40% diffuse midstenosis and chronic total occlusion of the mid/distal RCA. Left-to-right and right-to-right collaterals are present  CV Surgery:  Right carotid endarterectomy (12/2007, Dr. Lucky Cowboy)  EP Procedures and Devices:  None  Non-Invasive Evaluation(s):  Carotid Doppler (05/11/17): Patent right carotid endarterectomy site with no significant internal  carotid artery stenosis. Mild left proximal internal carotid artery disease (less than 40%). No significant change from prior study in 04/2016.  Exercise MPI (11/12/15): No ischemia or scar. LVEF mildly reduced at 45%. Good exercise capacity.  TTE (11/12/15): Normal biventricular systolic function (LVEF 42%). Mild TR and MR.  Recent CV Pertinent Labs: Lab Results  Component Value Date   CHOL 108 08/08/2017   CHOL 190 12/17/2016   HDL 45 08/08/2017   HDL 49 12/17/2016   LDLCALC 52 08/08/2017   LDLCALC 115 (H) 05/25/2017   TRIG 57 08/08/2017   CHOLHDL 2.4 08/08/2017   INR 1.0 06/22/2017   K 4.8 10/20/2017   BUN 15 10/20/2017   CREATININE 1.27 10/20/2017   CREATININE 1.32 (H) 04/06/2017    Past medical and surgical history were reviewed and updated in EPIC.  Current Meds  Medication Sig  . aspirin EC 81 MG tablet Take 1 tablet (81 mg total) by mouth daily.  . Cholecalciferol (VITAMIN D) 2000 UNITS tablet Take 2,000 Units by mouth daily.   Marland Kitchen ezetimibe (ZETIA) 10 MG tablet TAKE 1 TABLET DAILY  . ibuprofen (ADVIL,MOTRIN) 200 MG tablet Take 600 mg by mouth daily as needed for moderate pain.  . isosorbide mononitrate (IMDUR) 30 MG 24 hr tablet Take 1 tablet (30 mg total) by mouth daily.  Marland Kitchen losartan (COZAAR) 50 MG tablet TAKE ONE TABLET EVERY DAY  . rosuvastatin (CRESTOR) 5 MG tablet Take 1 tablet (5 mg total) by mouth daily.    Allergies: Plavix [clopidogrel]  Social History   Tobacco Use  . Smoking status: Former Smoker    Packs/day: 1.50    Years: 45.00    Pack years: 67.50    Types: Cigarettes    Last attempt to quit: 09/29/2014    Years since quitting: 3.5  . Smokeless tobacco: Never Used  Substance  Use Topics  . Alcohol use: No    Comment: Quit 1990  . Drug use: No    Family History  Problem Relation Age of Onset  . Cancer Mother        breast  . Hypertension Mother   . Hyperlipidemia Mother   . Arthritis Mother        osteo  . Hypertension Father   .  Heart disease Father        CHF  . Kidney failure Father        causing death  . Autoimmune disease Sister   . Lupus Sister   . Healthy Son   . Healthy Son     Review of Systems: A 12-system review of systems was performed and was negative except as noted in the HPI.  --------------------------------------------------------------------------------------------------  Physical Exam: BP 122/72 (BP Location: Left Arm, Patient Position: Sitting, Cuff Size: Normal)   Pulse (!) 59   Ht 5\' 9"  (1.753 m)   Wt 191 lb 8 oz (86.9 kg)   BMI 28.28 kg/m   General: NAD. HEENT: No conjunctival pallor or scleral icterus. Moist mucous membranes.  OP clear. Neck: Supple without lymphadenopathy, thyromegaly, JVD, or HJR.  Lungs: Normal work of breathing. Clear to auscultation bilaterally without wheezes or crackles. Heart: Bradycardic but regular without murmurs, rubs, or gallops. Non-displaced PMI. Abd: Bowel sounds present. Soft, NT/ND without hepatosplenomegaly Ext: No lower extremity edema. Radial, PT, and DP pulses are 2+ bilaterally. Skin: Warm and dry without rash.  EKG: Sinus bradycardia (heart rate 59 bpm) with low voltage.  No significant abnormalities.  Lab Results  Component Value Date   WBC 8.8 06/22/2017   HGB 14.5 06/22/2017   HCT 42.3 06/22/2017   MCV 89 06/22/2017   PLT 291 06/22/2017    Lab Results  Component Value Date   NA 139 10/20/2017   K 4.8 10/20/2017   CL 104 10/20/2017   CO2 22 10/20/2017   BUN 15 10/20/2017   CREATININE 1.27 10/20/2017   GLUCOSE 87 10/20/2017   ALT 25 08/08/2017    Lab Results  Component Value Date   CHOL 108 08/08/2017   HDL 45 08/08/2017   LDLCALC 52 08/08/2017   TRIG 57 08/08/2017   CHOLHDL 2.4 08/08/2017    --------------------------------------------------------------------------------------------------  ASSESSMENT AND PLAN: Coronary artery disease with stable angina Symptoms are well controlled with current antianginal  regimen consisting of metoprolol and isosorbide mononitrate.  I encouraged [name] to continue exercising regularly.  We will continue with indefinite low-dose aspirin and statin therapy.  Hyperlipidemia LDL at goal on last check.  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  Hypertension Blood pressure well controlled today.  No medication changes.  Chronic kidney disease stage II-III I will recheck renal function and electrolytes today.  Follow-up: Return to clinic in 1 year.  Nelva Bush, MD 04/19/2018 10:05 AM

## 2018-04-19 ENCOUNTER — Encounter: Payer: Self-pay | Admitting: Internal Medicine

## 2018-04-19 ENCOUNTER — Ambulatory Visit: Payer: BLUE CROSS/BLUE SHIELD | Admitting: Internal Medicine

## 2018-04-19 VITALS — BP 122/72 | HR 59 | Ht 69.0 in | Wt 191.5 lb

## 2018-04-19 DIAGNOSIS — I25118 Atherosclerotic heart disease of native coronary artery with other forms of angina pectoris: Secondary | ICD-10-CM

## 2018-04-19 DIAGNOSIS — E785 Hyperlipidemia, unspecified: Secondary | ICD-10-CM

## 2018-04-19 DIAGNOSIS — N183 Chronic kidney disease, stage 3 unspecified: Secondary | ICD-10-CM

## 2018-04-19 DIAGNOSIS — I10 Essential (primary) hypertension: Secondary | ICD-10-CM | POA: Diagnosis not present

## 2018-04-19 NOTE — Patient Instructions (Signed)
Medication Instructions:  Your physician recommends that you continue on your current medications as directed. Please refer to the Current Medication list given to you today.  If you need a refill on your cardiac medications before your next appointment, please call your pharmacy.   Lab work: Your physician recommends that you return for lab work in: TODAY - CMET, CBC, LIPID, TOTAL CK.  If you have labs (blood work) drawn today and your tests are completely normal, you will receive your results only by: Marland Kitchen MyChart Message (if you have MyChart) OR . A paper copy in the mail If you have any lab test that is abnormal or we need to change your treatment, we will call you to review the results.  Testing/Procedures: NONE  Follow-Up: At [facility], you and your health needs are our priority.  As part of our continuing mission to provide you with exceptional heart care, we have created designated Provider Care Teams.  These Care Teams include your primary Cardiologist (physician) and Advanced Practice Providers (APPs -  Physician Assistants and Nurse Practitioners) who all work together to provide you with the care you need, when you need it. You will need a follow up appointment in 12 months.  Please call our office 2 months in advance to schedule this appointment.  You may see DR Harrell Gave END or one of the following Advanced Practice Providers on your designated Care Team:   Murray Hodgkins, NP Christell Faith, PA-C . Marrianne Mood, PA-C

## 2018-04-20 LAB — CBC WITH DIFFERENTIAL/PLATELET
Basophils Absolute: 0.1 10*3/uL (ref 0.0–0.2)
Basos: 1 %
EOS (ABSOLUTE): 0.2 10*3/uL (ref 0.0–0.4)
Eos: 2 %
Hematocrit: 40.7 % (ref 37.5–51.0)
Hemoglobin: 14.2 g/dL (ref 13.0–17.7)
Immature Grans (Abs): 0 10*3/uL (ref 0.0–0.1)
Immature Granulocytes: 0 %
Lymphocytes Absolute: 2.1 10*3/uL (ref 0.7–3.1)
Lymphs: 26 %
MCH: 31.4 pg (ref 26.6–33.0)
MCHC: 34.9 g/dL (ref 31.5–35.7)
MCV: 90 fL (ref 79–97)
Monocytes Absolute: 0.6 10*3/uL (ref 0.1–0.9)
Monocytes: 7 %
Neutrophils Absolute: 5.3 10*3/uL (ref 1.4–7.0)
Neutrophils: 64 %
Platelets: 245 10*3/uL (ref 150–450)
RBC: 4.52 x10E6/uL (ref 4.14–5.80)
RDW: 12.6 % (ref 12.3–15.4)
WBC: 8.3 10*3/uL (ref 3.4–10.8)

## 2018-04-20 LAB — COMPREHENSIVE METABOLIC PANEL
ALT: 27 IU/L (ref 0–44)
AST: 34 IU/L (ref 0–40)
Albumin/Globulin Ratio: 1.8 (ref 1.2–2.2)
Albumin: 4.5 g/dL (ref 3.6–4.8)
Alkaline Phosphatase: 49 IU/L (ref 39–117)
BUN/Creatinine Ratio: 14 (ref 10–24)
BUN: 18 mg/dL (ref 8–27)
Bilirubin Total: 0.8 mg/dL (ref 0.0–1.2)
CO2: 21 mmol/L (ref 20–29)
Calcium: 9.1 mg/dL (ref 8.6–10.2)
Chloride: 102 mmol/L (ref 96–106)
Creatinine, Ser: 1.29 mg/dL — ABNORMAL HIGH (ref 0.76–1.27)
GFR calc Af Amer: 68 mL/min/{1.73_m2} (ref >59)
GFR calc non Af Amer: 59 mL/min/{1.73_m2} — ABNORMAL LOW (ref >59)
Globulin, Total: 2.5 g/dL (ref 1.5–4.5)
Glucose: 93 mg/dL (ref 65–99)
Potassium: 5 mmol/L (ref 3.5–5.2)
Sodium: 138 mmol/L (ref 134–144)
Total Protein: 7 g/dL (ref 6.0–8.5)

## 2018-04-20 LAB — CK: Total CK: 181 U/L (ref 24–204)

## 2018-04-20 LAB — LIPID PANEL
Chol/HDL Ratio: 2.4 ratio (ref 0.0–5.0)
Cholesterol, Total: 136 mg/dL (ref 100–199)
HDL: 57 mg/dL (ref >39)
LDL Calculated: 64 mg/dL (ref 0–99)
Triglycerides: 75 mg/dL (ref 0–149)
VLDL Cholesterol Cal: 15 mg/dL (ref 5–40)

## 2018-04-21 ENCOUNTER — Ambulatory Visit
Admission: RE | Admit: 2018-04-21 | Discharge: 2018-04-21 | Disposition: A | Discharge status: Home / Self Care | Payer: BLUE CROSS/BLUE SHIELD | Source: Ambulatory Visit | Attending: Nurse Practitioner | Admitting: Nurse Practitioner

## 2018-04-21 DIAGNOSIS — J432 Centrilobular emphysema: Secondary | ICD-10-CM | POA: Diagnosis not present

## 2018-04-21 DIAGNOSIS — I700 Atherosclerosis of aorta: Secondary | ICD-10-CM | POA: Insufficient documentation

## 2018-04-21 DIAGNOSIS — Z87891 Personal history of nicotine dependence: Secondary | ICD-10-CM | POA: Insufficient documentation

## 2018-04-21 DIAGNOSIS — Z122 Encounter for screening for malignant neoplasm of respiratory organs: Secondary | ICD-10-CM | POA: Diagnosis not present

## 2018-04-25 ENCOUNTER — Encounter: Payer: Self-pay | Admitting: *Deleted

## 2018-05-12 ENCOUNTER — Encounter (INDEPENDENT_AMBULATORY_CARE_PROVIDER_SITE_OTHER): Payer: BLUE CROSS/BLUE SHIELD

## 2018-05-12 ENCOUNTER — Ambulatory Visit (INDEPENDENT_AMBULATORY_CARE_PROVIDER_SITE_OTHER): Payer: BLUE CROSS/BLUE SHIELD | Admitting: Vascular Surgery

## 2018-05-19 ENCOUNTER — Ambulatory Visit (INDEPENDENT_AMBULATORY_CARE_PROVIDER_SITE_OTHER): Payer: BLUE CROSS/BLUE SHIELD | Admitting: Vascular Surgery

## 2018-05-19 ENCOUNTER — Ambulatory Visit (INDEPENDENT_AMBULATORY_CARE_PROVIDER_SITE_OTHER): Payer: BLUE CROSS/BLUE SHIELD

## 2018-05-19 DIAGNOSIS — I6523 Occlusion and stenosis of bilateral carotid arteries: Secondary | ICD-10-CM

## 2018-05-24 ENCOUNTER — Other Ambulatory Visit: Payer: Self-pay | Admitting: Family Medicine

## 2018-05-24 DIAGNOSIS — I10 Essential (primary) hypertension: Secondary | ICD-10-CM

## 2018-05-29 ENCOUNTER — Other Ambulatory Visit: Payer: Self-pay

## 2018-05-29 ENCOUNTER — Ambulatory Visit (INDEPENDENT_AMBULATORY_CARE_PROVIDER_SITE_OTHER): Payer: BLUE CROSS/BLUE SHIELD | Admitting: Family Medicine

## 2018-05-29 VITALS — BP 118/74 | HR 57 | Temp 97.8°F | Resp 16 | Ht 69.0 in | Wt 191.0 lb

## 2018-05-29 DIAGNOSIS — R739 Hyperglycemia, unspecified: Secondary | ICD-10-CM

## 2018-05-29 DIAGNOSIS — N183 Chronic kidney disease, stage 3 unspecified: Secondary | ICD-10-CM

## 2018-05-29 DIAGNOSIS — E785 Hyperlipidemia, unspecified: Secondary | ICD-10-CM

## 2018-05-29 DIAGNOSIS — N138 Other obstructive and reflux uropathy: Secondary | ICD-10-CM

## 2018-05-29 DIAGNOSIS — Z0000 Encounter for general adult medical examination without abnormal findings: Secondary | ICD-10-CM | POA: Diagnosis not present

## 2018-05-29 DIAGNOSIS — N401 Enlarged prostate with lower urinary tract symptoms: Secondary | ICD-10-CM

## 2018-05-29 DIAGNOSIS — I25118 Atherosclerotic heart disease of native coronary artery with other forms of angina pectoris: Secondary | ICD-10-CM | POA: Diagnosis not present

## 2018-05-29 DIAGNOSIS — Z125 Encounter for screening for malignant neoplasm of prostate: Secondary | ICD-10-CM

## 2018-05-29 NOTE — Progress Notes (Signed)
Patient: Christopher Villegas, Male    DOB: July 20, 1955, 62 y.o.   MRN: [id number] Visit Date: 05/29/2018  Today's Provider: Wilhemena Durie, MD   Chief Complaint  Patient presents with  . Annual Exam   Subjective:  Christopher Villegas is a 62 y.o. male who presents today for health maintenance and complete physical. He feels well. He reports exercising daily. He reports he is sleeping well.  Immunization History  Administered Date(s) Administered  . Influenza,inj,Quad PF,6+ Mos 04/01/2016  . Influenza-Unspecified 03/15/2017  . Tdap 03/08/2008  . Zoster 04/23/2013  . Zoster Recombinat (Shingrix) 06/30/2017, 09/07/2017   06/16/17 Colonoscopy, Dr Ulla Potash adenoma, repeat 5 years  Review of Systems  Constitutional: Negative.   HENT: Negative.   Eyes: Negative.   Respiratory: Negative.   Cardiovascular: Negative.   Gastrointestinal: Negative.   Endocrine: Negative.   Genitourinary: Negative.   Musculoskeletal: Negative.   Skin: Negative.   Allergic/Immunologic: Negative.   Neurological: Negative.   Hematological: Negative.   Psychiatric/Behavioral: Negative.     Social History   Socioeconomic History  . Marital status: Married    Spouse name: Corporate treasurer  . Number of children: 2  . Years of education: 81  . Highest education level: Not on file  Occupational History  . Occupation: [employer]  Social Needs  . Financial resource strain: Not on file  . Food insecurity:    Worry: Not on file    Inability: Not on file  . Transportation needs:    Medical: Not on file    Non-medical: Not on file  Tobacco Use  . Smoking status: Former Smoker    Packs/day: 1.50    Years: 45.00    Pack years: 67.50    Types: Cigarettes    Last attempt to quit: 09/29/2014    Years since quitting: 3.6  . Smokeless tobacco: Never Used  Substance and Sexual Activity  . Alcohol use: No    Comment: Quit 1990  . Drug use: No  . Sexual activity: Yes  Lifestyle  .  Physical activity:    Days per week: Not on file    Minutes per session: Not on file  . Stress: Not on file  Relationships  . Social connections:    Talks on phone: Not on file    Gets together: Not on file    Attends religious service: Not on file    Active member of club or organization: Not on file    Attends meetings of clubs or organizations: Not on file    Relationship status: Not on file  . Intimate partner violence:    Fear of current or ex partner: Not on file    Emotionally abused: Not on file    Physically abused: Not on file    Forced sexual activity: Not on file  Other Topics Concern  . Not on file  Social History Narrative  . Not on file    Patient Active Problem List   Diagnosis Date Noted  . Chronic kidney disease, stage 3 ([place]) 04/19/2018  . Coronary artery disease of native artery of native heart with stable angina pectoris ([place]) 07/20/2017  . Accelerating angina ([place]) 06/22/2017  . History of colon polyps   . Benign neoplasm of descending colon   . Polyp of sigmoid colon   . Personal history of tobacco use, presenting hazards to health 04/13/2017  . Carotid stenosis 04/30/2016  . Hyperglycemia 08/14/2015  . Gastroesophageal reflux disease without esophagitis  08/14/2015  . Depression 08/14/2015  . Essential hypertension 10/18/2014  . 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 H/O neoplasm 10/08/2014  . Arthritis of knee, degenerative 05/08/2014  . Benign prostatic hyperplasia with urinary obstruction 10/12/2012  . Atherosclerosis of coronary artery 08/14/2009  . Avitaminosis D 05/22/2009  . Hyperlipidemia LDL goal <70 03/08/2008  . Actinic keratoses 03/08/2008    Past Surgical History:  Procedure Laterality Date  . CAROTID ENDARTERECTOMY Right 2009  . COLONOSCOPY WITH PROPOFOL N/A 06/16/2017   Procedure: COLONOSCOPY WITH PROPOFOL;  Surgeon: Lucilla Lame, MD;  Location: [facility];  Service: Endoscopy;  Laterality: N/A;  . CYST EXCISION  PERINEAL    . ESOPHAGOGASTRODUODENOSCOPY (EGD) WITH PROPOFOL N/A 06/16/2017   Procedure: ESOPHAGOGASTRODUODENOSCOPY (EGD) WITH PROPOFOL;  Surgeon: Lucilla Lame, MD;  Location: [facility];  Service: Endoscopy;  Laterality: N/A;  . LEFT HEART CATH AND CORONARY ANGIOGRAPHY N/A 07/01/2017   Procedure: LEFT HEART CATH AND CORONARY ANGIOGRAPHY;  Surgeon: Nelva Bush, MD;  Location: [facility];  Service: Cardiovascular;  Laterality: N/A;  . POLYPECTOMY  06/16/2017   Procedure: POLYPECTOMY;  Surgeon: Lucilla Lame, MD;  Location: [place] [facility];  Service: Endoscopy;;  . TONSILLECTOMY AND ADENOIDECTOMY      His family history includes Arthritis in his mother; Autoimmune disease in his sister; Cancer in his mother; Healthy in his son and son; Heart disease in his father; Hyperlipidemia in his mother; Hypertension in his father and mother; Kidney failure in his father; Lupus in his sister.     Outpatient Encounter Medications as of 05/29/2018  Medication Sig  . aspirin EC 81 MG tablet Take 1 tablet (81 mg total) by mouth daily.  . Cholecalciferol (VITAMIN D) 2000 UNITS tablet Take 2,000 Units by mouth daily.   Marland Kitchen ezetimibe (ZETIA) 10 MG tablet TAKE 1 TABLET DAILY  . ibuprofen (ADVIL,MOTRIN) 200 MG tablet Take 600 mg by mouth daily as needed for moderate pain.  . isosorbide mononitrate (IMDUR) 30 MG 24 hr tablet Take 1 tablet (30 mg total) by mouth daily.  Marland Kitchen losartan (COZAAR) 50 MG tablet TAKE ONE TABLET EVERY DAY  . rosuvastatin (CRESTOR) 5 MG tablet Take 1 tablet (5 mg total) by mouth daily.  . metoprolol tartrate (LOPRESSOR) 25 MG tablet Take 1 tablet (25 mg total) by mouth 2 (two) times daily.  . nitroGLYCERIN (NITROSTAT) 0.4 MG SL tablet Place 1 tablet (0.4 mg total) under the tongue every 5 (five) minutes as needed for chest pain. For a maximum of 3 doses.   No facility-administered encounter medications on file as of 05/29/2018.     Patient Care Team: Jerrol Banana., MD as PCP - General (Family Medicine)      Objective:   Vitals:  Vitals:   05/29/18 0907  BP: 118/74  Pulse: (!) 57  Resp: 16  Temp: 97.8 F (36.6 C)  TempSrc: Oral  SpO2: 99%  Weight: 191 lb (86.6 kg)  Height: 5\' 9"  (1.753 m)    Physical Exam Constitutional:      Appearance: Normal appearance. He is normal weight.  HENT:     Head: Normocephalic and atraumatic.     Nose: Nose normal.     Mouth/Throat:     Mouth: Mucous membranes are moist.     Pharynx: Oropharynx is clear.  Eyes:     Extraocular Movements: Extraocular movements intact.     Conjunctiva/sclera: Conjunctivae normal.     Pupils: Pupils are equal, round, and reactive to light.  Neck:     Musculoskeletal: Normal range of motion.  Cardiovascular:     Rate and Rhythm: Normal rate and regular rhythm.  Pulmonary:     Effort: Pulmonary effort is normal.     Breath sounds: Normal breath sounds.  Abdominal:     General: Abdomen is flat. Bowel sounds are normal.     Palpations: Abdomen is soft.  Genitourinary:    Penis: Normal.      Scrotum/Testes: Normal.     Prostate: Normal.     Rectum: Normal.  Musculoskeletal: Normal range of motion.  Skin:    General: Skin is warm.  Neurological:     General: No focal deficit present.     Mental Status: He is alert and oriented to person, place, and time. Mental status is at baseline.  Psychiatric:        Mood and Affect: Mood normal.        Behavior: Behavior normal.        Thought Content: Thought content normal.        Judgment: Judgment normal.    Fall Risk  05/29/2018 04/06/2017 12/05/2014  Falls in the past year? 0 No No   Depression Screen PHQ 2/9 Scores 05/29/2018 04/06/2017 12/05/2014  PHQ - 2 Score 0 0 2  PHQ- 9 Score - 0 2   Functional Status Survey: Is the patient deaf or have difficulty hearing?: No Does the patient have difficulty seeing, even when wearing glasses/contacts?: No Does the patient have difficulty concentrating, remembering,  or making decisions?: No Does the patient have difficulty walking or climbing stairs?: No Does the patient have difficulty dressing or bathing?: No Does the patient have difficulty doing errands alone such as visiting a doctor's office or shopping?: No    Office Visit from 05/29/2018 in [place]  AUDIT-C Score  0     Current Exercise Habits: Home exercise routine, Frequency (Times/Week): 7     Assessment & Plan:     Routine Health Maintenance and Physical Exam  Exercise Activities and Dietary recommendations Goals   None     Immunization History  Administered Date(s) Administered  . Influenza,inj,Quad PF,6+ Mos 04/01/2016  . Influenza-Unspecified 03/15/2017  . Tdap 03/08/2008  . Zoster 04/23/2013  . Zoster Recombinat (Shingrix) 06/30/2017, 09/07/2017    Health Maintenance  Topic Date Due  . HIV Screening  04/30/1969  . INFLUENZA VACCINE  01/12/2018  . TETANUS/TDAP  03/08/2018  . COLONOSCOPY  06/16/2022  . Hepatitis C Screening  Completed     Discussed health benefits of physical activity, and encouraged him to engage in regular exercise appropriate for his age and condition.     1. Annual physical exam  - TSH  2. Prostate cancer screening  - PSA  3. Coronary artery disease of native artery of native heart with stable angina pectoris ([place]) All risk factors treated  4. Chronic kidney disease, stage 3 ([place])   5. Benign prostatic hyperplasia with urinary obstruction  6. Hyperglycemia   7. Hyperlipidemia LDL goal <70     HPI, Exam and A&P Transcribed under the direction and in the presence of Miguel Aschoff, Brooke Bonito., MD. Electronically Signed: Althea Charon, RMA I have done the exam and reviewed the chart and it is accurate to the best of my knowledge. Development worker, community has been used and  any errors in dictation or transcription are unintentional. Miguel Aschoff M.D. [facility]

## 2018-05-30 LAB — PSA: Prostate Specific Ag, Serum: 0.7 ng/mL (ref 0.0–4.0)

## 2018-05-30 LAB — TSH: TSH: 1.11 u[IU]/mL (ref 0.450–4.500)

## 2018-06-13 DIAGNOSIS — D229 Melanocytic nevi, unspecified: Secondary | ICD-10-CM | POA: Diagnosis not present

## 2018-06-13 DIAGNOSIS — L570 Actinic keratosis: Secondary | ICD-10-CM | POA: Diagnosis not present

## 2018-06-13 DIAGNOSIS — L720 Epidermal cyst: Secondary | ICD-10-CM | POA: Diagnosis not present

## 2018-06-13 DIAGNOSIS — L814 Other melanin hyperpigmentation: Secondary | ICD-10-CM | POA: Diagnosis not present

## 2018-06-15 ENCOUNTER — Other Ambulatory Visit: Payer: Self-pay

## 2018-06-15 MED ORDER — METOPROLOL TARTRATE 25 MG PO TABS: 25.0000 mg | ORAL_TABLET | Freq: Two times a day (BID) | ORAL | 3 refills | Status: DC

## 2018-06-20 ENCOUNTER — Other Ambulatory Visit: Payer: Self-pay

## 2018-06-20 MED ORDER — ISOSORBIDE MONONITRATE ER 30 MG PO TB24: 30.0000 mg | ORAL_TABLET | Freq: Every day | ORAL | 11 refills | Status: DC

## 2018-07-12 ENCOUNTER — Other Ambulatory Visit: Payer: Self-pay | Admitting: Family Medicine

## 2018-07-20 DIAGNOSIS — M25542 Pain in joints of left hand: Secondary | ICD-10-CM | POA: Diagnosis not present

## 2018-07-20 DIAGNOSIS — S63692A Other sprain of right middle finger, initial encounter: Secondary | ICD-10-CM | POA: Diagnosis not present

## 2018-07-20 DIAGNOSIS — M79644 Pain in right finger(s): Secondary | ICD-10-CM | POA: Diagnosis not present

## 2018-07-25 DIAGNOSIS — D489 Neoplasm of uncertain behavior, unspecified: Secondary | ICD-10-CM | POA: Diagnosis not present

## 2018-07-25 DIAGNOSIS — L438 Other lichen planus: Secondary | ICD-10-CM | POA: Diagnosis not present

## 2018-08-18 DIAGNOSIS — M65331 Trigger finger, right middle finger: Secondary | ICD-10-CM | POA: Diagnosis not present

## 2018-08-18 DIAGNOSIS — M79641 Pain in right hand: Secondary | ICD-10-CM | POA: Diagnosis not present

## 2018-12-05 DIAGNOSIS — M65331 Trigger finger, right middle finger: Secondary | ICD-10-CM | POA: Diagnosis not present

## 2019-01-16 DIAGNOSIS — D485 Neoplasm of uncertain behavior of skin: Secondary | ICD-10-CM | POA: Diagnosis not present

## 2019-01-16 DIAGNOSIS — L821 Other seborrheic keratosis: Secondary | ICD-10-CM | POA: Diagnosis not present

## 2019-01-16 DIAGNOSIS — L814 Other melanin hyperpigmentation: Secondary | ICD-10-CM | POA: Diagnosis not present

## 2019-01-16 DIAGNOSIS — D2372 Other benign neoplasm of skin of left lower limb, including hip: Secondary | ICD-10-CM | POA: Diagnosis not present

## 2019-01-16 DIAGNOSIS — L578 Other skin changes due to chronic exposure to nonionizing radiation: Secondary | ICD-10-CM | POA: Diagnosis not present

## 2019-01-16 DIAGNOSIS — L728 Other follicular cysts of the skin and subcutaneous tissue: Secondary | ICD-10-CM | POA: Diagnosis not present

## 2019-04-13 ENCOUNTER — Other Ambulatory Visit: Payer: Self-pay | Admitting: Family Medicine

## 2019-04-13 DIAGNOSIS — I10 Essential (primary) hypertension: Secondary | ICD-10-CM

## 2019-04-20 ENCOUNTER — Encounter: Payer: Self-pay | Admitting: Internal Medicine

## 2019-04-20 ENCOUNTER — Other Ambulatory Visit: Payer: Self-pay

## 2019-04-20 ENCOUNTER — Telehealth: Payer: Self-pay | Admitting: *Deleted

## 2019-04-20 ENCOUNTER — Encounter: Payer: Self-pay | Admitting: *Deleted

## 2019-04-20 ENCOUNTER — Ambulatory Visit (INDEPENDENT_AMBULATORY_CARE_PROVIDER_SITE_OTHER): Payer: BC Managed Care – PPO | Admitting: Internal Medicine

## 2019-04-20 VITALS — BP 120/64 | HR 58 | Ht 69.0 in | Wt 181.2 lb

## 2019-04-20 DIAGNOSIS — I25118 Atherosclerotic heart disease of native coronary artery with other forms of angina pectoris: Secondary | ICD-10-CM | POA: Diagnosis not present

## 2019-04-20 DIAGNOSIS — I10 Essential (primary) hypertension: Secondary | ICD-10-CM | POA: Diagnosis not present

## 2019-04-20 DIAGNOSIS — E7800 Pure hypercholesterolemia, unspecified: Secondary | ICD-10-CM | POA: Diagnosis not present

## 2019-04-20 DIAGNOSIS — E785 Hyperlipidemia, unspecified: Secondary | ICD-10-CM | POA: Diagnosis not present

## 2019-04-20 DIAGNOSIS — Z122 Encounter for screening for malignant neoplasm of respiratory organs: Secondary | ICD-10-CM

## 2019-04-20 DIAGNOSIS — Z79899 Other long term (current) drug therapy: Secondary | ICD-10-CM | POA: Diagnosis not present

## 2019-04-20 DIAGNOSIS — Z87891 Personal history of nicotine dependence: Secondary | ICD-10-CM

## 2019-04-20 NOTE — Patient Instructions (Signed)
Medication Instructions:  Your physician recommends that you continue on your current medications as directed. Please refer to the Current Medication list given to you today.  *If you need a refill on your cardiac medications before your next appointment, please call your pharmacy*  Lab Work: Your physician recommends that you return for lab work in: TODAY - CBC, CMET, FASTING LIPID, CPK.  If you have labs (blood work) drawn today and your tests are completely normal, you will receive your results only by: Marland Kitchen MyChart Message (if you have MyChart) OR . A paper copy in the mail If you have any lab test that is abnormal or we need to change your treatment, we will call you to review the results.  Testing/Procedures: none  Follow-Up: At [facility], you and your health needs are our priority.  As part of our continuing mission to provide you with exceptional heart care, we have created designated Provider Care Teams.  These Care Teams include your primary Cardiologist (physician) and Advanced Practice Providers (APPs -  Physician Assistants and Nurse Practitioners) who all work together to provide you with the care you need, when you need it.  Your next appointment:   12 months  The format for your next appointment:   In Person  Provider:    You may see DR Harrell Gave END or one of the following Advanced Practice Providers on your designated Care Team:    Murray Hodgkins, NP  Christell Faith, PA-C  Marrianne Mood, PA-C

## 2019-04-20 NOTE — Telephone Encounter (Signed)
Left message for patient to notify them that it is time to schedule annual low dose lung cancer screening CT scan. Instructed patient to call back to verify information prior to the scan being scheduled.  

## 2019-04-20 NOTE — Progress Notes (Signed)
Follow-up Outpatient Visit Date: 04/20/2019  Primary Care Provider: Jerrol Banana., MD [address] [place] [address]  Chief Complaint: Follow-up coronary artery disease  HPI:  Mr. Bronikowski is a 63 y.o. year-old male with history of coronary artery disease with chronic total occlusion of the mid RCA managed medically, hypertension, hyperlipidemia, and carotid artery stenosis status post right carotid endarterectomy, who presents for follow-up of coronary artery disease with stable angina.  I last saw Mr. Heglund a year ago, at which time he was doing well.  He was exercising regularly without chest pain or shortness of breath.  Chronic myalgias were unchanged.  Labs, including total CK (history of statin myopathy) were normal.  Today, Mr. Salzillo reports that he has been feeling very well. He continues to bruise easily with low-dose aspirin but has not had any significant bleeding. He is still exercising regularly without any limitations. He feels like he is in the best shape of his life. He denies chest pain, shortness of breath, palpitations, lightheadedness, and edema. He has not had any significant myalgias, remaining on rosuvastatin and ezetimibe.  --------------------------------------------------------------------------------------------------  Cardiovascular History & Procedures: Cardiovascular Problems:  Coronary artery disease with stableangina  Risk Factors:  Cerebrovascular disease, hypertension, hyperlipidemia, male gender, obesity, age greater than 61, and history of tobacco use.  Cath/PCI:  LHC (07/01/17): LMCA with 30% ostial and distal lesions. LAD with 30% midvessel stenosis. LCX with 20% ostial and 40% proximal lesions. RCA with 40% diffuse midstenosis and chronic total occlusion of the mid/distal RCA. Left-to-right and right-to-right collaterals are present  CV Surgery:  Right carotid endarterectomy (12/2007, Dr. Lucky Cowboy)  EP Procedures and  Devices:  None  Non-Invasive Evaluation(s):  Carotid Doppler (05/11/17): Patent right carotid endarterectomy site with no significant internal carotid artery stenosis. Mild left proximal internal carotid artery disease (less than 40%). No significant change from prior study in 04/2016.  Exercise MPI (11/12/15): No ischemia or scar. LVEF mildly reduced at 45%. Good exercise capacity.  TTE (11/12/15): Normal biventricular systolic function (LVEF [id number]). Mild TR and MR.  Recent CV Pertinent Labs: Lab Results  Component Value Date   CHOL 150 04/20/2019   HDL 61 04/20/2019   LDLCALC 75 04/20/2019   LDLCALC 115 (H) 05/25/2017   TRIG 72 04/20/2019   CHOLHDL 2.5 04/20/2019   CHOLHDL 2.4 08/08/2017   INR 1.0 06/22/2017   K 5.0 04/20/2019   BUN 17 04/20/2019   CREATININE 1.24 04/20/2019   CREATININE 1.32 (H) 04/06/2017    Past medical and surgical history were reviewed and updated in EPIC.  Current Meds  Medication Sig  . aspirin EC 81 MG tablet Take 1 tablet (81 mg total) by mouth daily.  . Cholecalciferol (VITAMIN D) 2000 UNITS tablet Take 2,000 Units by mouth daily.   Marland Kitchen ezetimibe (ZETIA) 10 MG tablet TAKE 1 TABLET DAILY  . ibuprofen (ADVIL,MOTRIN) 200 MG tablet Take 600 mg by mouth daily as needed for moderate pain.  . isosorbide mononitrate (IMDUR) 30 MG 24 hr tablet Take 1 tablet (30 mg total) by mouth daily.  Marland Kitchen losartan (COZAAR) 50 MG tablet TAKE 1 TABLET BY MOUTH DAILY  . metoprolol tartrate (LOPRESSOR) 25 MG tablet Take 1 tablet (25 mg total) by mouth 2 (two) times daily.  . nitroGLYCERIN (NITROSTAT) 0.4 MG SL tablet Place 1 tablet (0.4 mg total) under the tongue every 5 (five) minutes as needed for chest pain. For a maximum of 3 doses.  . rosuvastatin (CRESTOR) 5 MG tablet  Take 1 tablet (5 mg total) by mouth daily.    Allergies: Plavix [clopidogrel]  Social History   Tobacco Use  . Smoking status: Former Smoker    Packs/day: 1.50    Years: 45.00    Pack years:  67.50    Types: Cigarettes    Quit date: 09/29/2014    Years since quitting: 4.5  . Smokeless tobacco: Never Used  Substance Use Topics  . Alcohol use: No    Comment: Quit 1990  . Drug use: No    Family History  Problem Relation Age of Onset  . Cancer Mother        breast  . Hypertension Mother   . Hyperlipidemia Mother   . Arthritis Mother        osteo  . Hypertension Father   . Heart disease Father        CHF  . Kidney failure Father        causing death  . Autoimmune disease Sister   . Lupus Sister   . Healthy Son   . Healthy Son     Review of Systems: A 12-system review of systems was performed and was negative except as noted in the HPI.  --------------------------------------------------------------------------------------------------  Physical Exam: BP 120/64 (BP Location: Left Arm, Patient Position: Sitting, Cuff Size: Normal)   Pulse (!) 58   Ht 5\' 9"  (1.753 m)   Wt 181 lb 4 oz (82.2 kg)   SpO2 99%   BMI 26.77 kg/m   General: NAD. HEENT: No conjunctival pallor or scleral icterus. Facemask in place. Neck: Supple without lymphadenopathy, thyromegaly, JVD, or HJR. Lungs: Normal work of breathing. Clear to auscultation bilaterally without wheezes or crackles. Heart: Regular rate and rhythm without murmurs, rubs, or gallops. Non-displaced PMI. Abd: Bowel sounds present. Soft, NT/ND without hepatosplenomegaly Ext: No lower extremity edema. Radial, PT, and DP pulses are 2+ bilaterally. Skin: Warm and dry without rash.  EKG: Sinus bradycardia (heart rate 58 bpm). No significant abnormality.  Lab Results  Component Value Date   WBC 7.4 04/20/2019   HGB 14.3 04/20/2019   HCT 41.3 04/20/2019   MCV 93 04/20/2019   PLT 233 04/20/2019    Lab Results  Component Value Date   NA 140 04/20/2019   K 5.0 04/20/2019   CL 105 04/20/2019   CO2 22 04/20/2019   BUN 17 04/20/2019   CREATININE 1.24 04/20/2019   GLUCOSE 91 04/20/2019   ALT 28 04/20/2019    Lab  Results  Component Value Date   CHOL 150 04/20/2019   HDL 61 04/20/2019   LDLCALC 75 04/20/2019   TRIG 72 04/20/2019   CHOLHDL 2.5 04/20/2019    --------------------------------------------------------------------------------------------------  ASSESSMENT AND PLAN: Coronary artery disease with stable angina: Mr. Brockhoff continues to do very well with medical therapy for management of his chronic total occlusion of the RCA. We will not make any medication changes today, continuing isosorbide mononitrate for antianginal therapy. I encouraged Mr. Niznik to remain active. Indefinite aspirin and statin therapy will be continued.  Hypertension: Blood pressure is well-controlled.  No medication changes today.  Hyperlipidemia: Mr. Siva is tolerating rosuvastatin 5 mg daily and ezetimibe 10 mg daily well. We will check a fasting lipid panel, LFTs, and creatine kinase today.  Follow-up: Return to clinic in 1 year  Nelva Bush, MD 04/22/2019 10:16 AM

## 2019-04-21 LAB — CBC
Hematocrit: 41.3 % (ref 37.5–51.0)
Hemoglobin: 14.3 g/dL (ref 13.0–17.7)
MCH: 32.1 pg (ref 26.6–33.0)
MCHC: 34.6 g/dL (ref 31.5–35.7)
MCV: 93 fL (ref 79–97)
Platelets: 233 10*3/uL (ref 150–450)
RBC: 4.46 x10E6/uL (ref 4.14–5.80)
RDW: 12.1 % (ref 11.6–15.4)
WBC: 7.4 10*3/uL (ref 3.4–10.8)

## 2019-04-21 LAB — COMPREHENSIVE METABOLIC PANEL
ALT: 28 IU/L (ref 0–44)
AST: 39 IU/L (ref 0–40)
Albumin/Globulin Ratio: 1.8 (ref 1.2–2.2)
Albumin: 4.7 g/dL (ref 3.8–4.8)
Alkaline Phosphatase: 61 IU/L (ref 39–117)
BUN/Creatinine Ratio: 14 (ref 10–24)
BUN: 17 mg/dL (ref 8–27)
Bilirubin Total: 0.9 mg/dL (ref 0.0–1.2)
CO2: 22 mmol/L (ref 20–29)
Calcium: 9.5 mg/dL (ref 8.6–10.2)
Chloride: 105 mmol/L (ref 96–106)
Creatinine, Ser: 1.24 mg/dL (ref 0.76–1.27)
GFR calc Af Amer: 71 mL/min/{1.73_m2} (ref >59)
GFR calc non Af Amer: 61 mL/min/{1.73_m2} (ref >59)
Globulin, Total: 2.6 g/dL (ref 1.5–4.5)
Glucose: 91 mg/dL (ref 65–99)
Potassium: 5 mmol/L (ref 3.5–5.2)
Sodium: 140 mmol/L (ref 134–144)
Total Protein: 7.3 g/dL (ref 6.0–8.5)

## 2019-04-21 LAB — CK: Total CK: 299 U/L (ref 41–331)

## 2019-04-21 LAB — LIPID PANEL
Chol/HDL Ratio: 2.5 ratio (ref 0.0–5.0)
Cholesterol, Total: 150 mg/dL (ref 100–199)
HDL: 61 mg/dL (ref >39)
LDL Chol Calc (NIH): 75 mg/dL (ref 0–99)
Triglycerides: 72 mg/dL (ref 0–149)
VLDL Cholesterol Cal: 14 mg/dL (ref 5–40)

## 2019-04-23 DIAGNOSIS — Z79899 Other long term (current) drug therapy: Secondary | ICD-10-CM

## 2019-04-23 DIAGNOSIS — I10 Essential (primary) hypertension: Secondary | ICD-10-CM

## 2019-04-23 DIAGNOSIS — E785 Hyperlipidemia, unspecified: Secondary | ICD-10-CM

## 2019-04-23 NOTE — Telephone Encounter (Signed)
Patient has been notified that annual lung cancer screening low dose CT scan is due currently or will be in near future. Confirmed that patient is within the age range of 55-77, and asymptomatic, (no signs or symptoms of lung cancer). Patient denies illness that would prevent curative treatment for lung cancer if found. Verified smoking history, (former, quit 09/27/14, 67.5 pack year). The shared decision making visit was done 04/13/17. Patient is agreeable for CT scan being scheduled.

## 2019-04-30 ENCOUNTER — Ambulatory Visit
Admission: RE | Admit: 2019-04-30 | Discharge: 2019-04-30 | Disposition: A | Discharge status: Home / Self Care | Payer: BC Managed Care – PPO | Source: Ambulatory Visit | Attending: Nurse Practitioner | Admitting: Nurse Practitioner

## 2019-04-30 ENCOUNTER — Other Ambulatory Visit: Payer: Self-pay

## 2019-04-30 DIAGNOSIS — Z87891 Personal history of nicotine dependence: Secondary | ICD-10-CM | POA: Diagnosis not present

## 2019-04-30 DIAGNOSIS — Z122 Encounter for screening for malignant neoplasm of respiratory organs: Secondary | ICD-10-CM | POA: Insufficient documentation

## 2019-05-02 ENCOUNTER — Encounter: Payer: Self-pay | Admitting: *Deleted

## 2019-05-31 ENCOUNTER — Encounter: Payer: BLUE CROSS/BLUE SHIELD | Admitting: Family Medicine

## 2019-06-07 ENCOUNTER — Other Ambulatory Visit: Payer: Self-pay | Admitting: Internal Medicine

## 2019-06-14 ENCOUNTER — Other Ambulatory Visit: Payer: Self-pay | Admitting: Internal Medicine

## 2019-06-15 HISTORY — PX: BICEPS TENDON REPAIR: SHX566

## 2019-07-09 ENCOUNTER — Other Ambulatory Visit: Payer: Self-pay | Admitting: Family Medicine

## 2019-07-26 ENCOUNTER — Ambulatory Visit: Payer: BC Managed Care – PPO

## 2019-08-13 ENCOUNTER — Ambulatory Visit (INDEPENDENT_AMBULATORY_CARE_PROVIDER_SITE_OTHER): Payer: BC Managed Care – PPO | Admitting: Family Medicine

## 2019-08-13 ENCOUNTER — Telehealth: Payer: Self-pay

## 2019-08-13 DIAGNOSIS — R059 Cough, unspecified: Secondary | ICD-10-CM

## 2019-08-13 DIAGNOSIS — J439 Emphysema, unspecified: Secondary | ICD-10-CM

## 2019-08-13 DIAGNOSIS — R05 Cough: Secondary | ICD-10-CM

## 2019-08-13 MED ORDER — AZITHROMYCIN 250 MG PO TABS: ORAL_TABLET | ORAL | 0 refills | Status: DC

## 2019-08-13 NOTE — Telephone Encounter (Signed)
Copied from [place] [id number]. Topic: Appointment Scheduling - Scheduling Inquiry for Clinic >> Aug 13, 2019  8:12 AM Christopher Villegas wrote: Reason for CRM: Pt called for appt regarding a continuous cough. He was scheduled for a virtual due to answering Yes for Covid screening question. Pt stated he has had Covid vaccine and no other symptoms and would prefer to come in to office so Dr. Rosanna Randy can listen to his lungs. Please advise and follow up with pt.

## 2019-08-13 NOTE — Progress Notes (Signed)
Patient: Christopher Villegas Male    DOB: [birth year]   64 y.o.   MRN: [id number] Visit Date: 08/13/2019  Today's Provider: Wilhemena Durie, MD   Chief Complaint  Patient presents with  . Cough   Subjective:    Virtual Visit via Telephone Note  I connected with Christopher Villegas on 08/13/19 at 10:20 AM EST by telephone and verified that I am speaking with the correct person using two identifiers.  Location: Patient: Home Provider: Office   I discussed the limitations, risks, security and privacy concerns of performing an evaluation and management service by telephone and the availability of in person appointments. I also discussed with the patient that there may be a patient responsible charge related to this service. The patient expressed understanding and agreed to proceed.   Cough This is a new problem. The current episode started more than 1 month ago. The cough is productive of sputum. Associated symptoms include chest pain. Nothing aggravates the symptoms. He has tried OTC cough suppressant for the symptoms. The treatment provided no relief.  Patient overall is doing well.  He had his first Covid vaccine about 6 weeks ago and about a week later developed a nonproductive cough.  It is not constant, he has periods of almost a spasm with the cough.  He has had a second vaccine and the cough has become slightly productive recently.  No fever chills myalgias or shortness of breath.  Allergies  Allergen Reactions  . Plavix [Clopidogrel] Rash     Current Outpatient Medications:  .  aspirin EC 81 MG tablet, Take 1 tablet (81 mg total) by mouth daily., Disp: 90 tablet, Rfl: 3 .  Cholecalciferol (VITAMIN D) 2000 UNITS tablet, Take 2,000 Units by mouth daily. , Disp: , Rfl:  .  ezetimibe (ZETIA) 10 MG tablet, TAKE 1 TABLET BY MOUTH DAILY, Disp: 30 tablet, Rfl: 0 .  ibuprofen (ADVIL,MOTRIN) 200 MG tablet, Take 600 mg by mouth daily as needed for moderate pain., Disp: ,  Rfl:  .  isosorbide mononitrate (IMDUR) 30 MG 24 hr tablet, TAKE 1 TABLET BY MOUTH DAILY, Disp: 30 tablet, Rfl: 5 .  losartan (COZAAR) 50 MG tablet, TAKE 1 TABLET BY MOUTH DAILY, Disp: 30 tablet, Rfl: 11 .  metoprolol tartrate (LOPRESSOR) 25 MG tablet, TAKE ONE TABLET TWICE DAILY, Disp: 180 tablet, Rfl: 3 .  nitroGLYCERIN (NITROSTAT) 0.4 MG SL tablet, Place 1 tablet (0.4 mg total) under the tongue every 5 (five) minutes as needed for chest pain. For a maximum of 3 doses., Disp: 25 tablet, Rfl: 2 .  rosuvastatin (CRESTOR) 5 MG tablet, TAKE ONE TABLET BY MOUTH DAILY, Disp: 90 tablet, Rfl: 3  Review of Systems  Respiratory: Positive for cough.   Cardiovascular: Positive for chest pain.    Social History   Tobacco Use  . Smoking status: Former Smoker    Packs/day: 1.50    Years: 45.00    Pack years: 67.50    Types: Cigarettes    Quit date: 09/29/2014    Years since quitting: 4.8  . Smokeless tobacco: Never Used  Substance Use Topics  . Alcohol use: No    Comment: Quit 1990      Objective:   There were no vitals taken for this visit. There were no vitals filed for this visit.There is no height or weight on file to calculate BMI.   Physical Exam   No results found for any visits on 08/13/19.  Assessment & Plan     1. Pulmonary emphysema, unspecified emphysema type ([place]) Mild COPD/emphysema noted on CT of chest.  No cancer and no pneumonia. - azithromycin (ZITHROMAX) 250 MG tablet; 2 p.o. on day 1 and then 1 p.o. days 2 through 5.  Dispense: 6 tablet; Refill: 0  2. Cough If this does not improve in a couple of weeks will add an inhaler.  We will see him then for lung exam. As mentioned above he has had both of his Covid vaccines.  I do not think this is Covid.  I discussed the assessment and treatment plan with the patient. The patient was provided an opportunity to ask questions and all were answered. The patient agreed with the plan and demonstrated an understanding of  the instructions.   The patient was advised to call back or seek an in-person evaluation if the symptoms worsen or if the condition fails to improve as anticipated.  I provided 11 minutes of non-face-to-face time during this encounter.     Richard Cranford Mon, MD  [facility]

## 2019-08-13 NOTE — Telephone Encounter (Signed)
Patient advised that dr. Rosanna Randy would like to keep appointment as telephone and will decide if patient should be seen in office.

## 2019-08-27 ENCOUNTER — Ambulatory Visit (INDEPENDENT_AMBULATORY_CARE_PROVIDER_SITE_OTHER): Payer: BC Managed Care – PPO | Admitting: Family Medicine

## 2019-08-27 ENCOUNTER — Other Ambulatory Visit: Payer: Self-pay

## 2019-08-27 ENCOUNTER — Encounter: Payer: Self-pay | Admitting: Family Medicine

## 2019-08-27 VITALS — BP 116/65 | HR 73 | Temp 97.1°F | Resp 18 | Ht 69.0 in | Wt 197.0 lb

## 2019-08-27 DIAGNOSIS — I25118 Atherosclerotic heart disease of native coronary artery with other forms of angina pectoris: Secondary | ICD-10-CM | POA: Diagnosis not present

## 2019-08-27 DIAGNOSIS — J441 Chronic obstructive pulmonary disease with (acute) exacerbation: Secondary | ICD-10-CM

## 2019-08-27 MED ORDER — PREDNISONE 10 MG PO TABS: ORAL_TABLET | ORAL | 0 refills | Status: DC

## 2019-08-27 MED ORDER — ALBUTEROL SULFATE 108 (90 BASE) MCG/ACT IN AEPB: 1.0000 | INHALATION_SPRAY | RESPIRATORY_TRACT | 5 refills | Status: DC

## 2019-08-27 NOTE — Progress Notes (Signed)
Patient: Christopher Villegas Male    DOB: 09-09-1955   64 y.o.   MRN: [id number] Visit Date: 08/27/2019  Today's Provider: Wilhemena Durie, MD   Chief Complaint  Patient presents with  . Follow-up  . Cough   Subjective:     HPI  He has developed a chronic cough over the past month or so.  Slowly getting worse.  It is nonproductive.  He will have spasms of cough.  Chest CT from November showed no cancer but emphysema present.  He does not feel like he has some wheezing.  He has exertional dyspnea but no chest pain or chest tightness.  No orthopnea. Pulmonary emphysema, unspecified emphysema type ([place]) From 08/13/2019-Mild COPD/emphysema noted on CT of chest.  No cancer and no pneumonia. Given rx for azithromycin (ZITHROMAX) 250 mg.  Cough From 08/13/2019-If this does not improve in a couple of weeks will add an inhaler.  We will see him then for lung exam. As mentioned above he has had both of his Covid vaccines. I do not think this is Covid.   Patient completed Zpack, however his cough appears to have worsened. No production. Patient also has shortness of breath upon excretion.    Allergies  Allergen Reactions  . Plavix [Clopidogrel] Rash     Current Outpatient Medications:  .  aspirin EC 81 MG tablet, Take 1 tablet (81 mg total) by mouth daily., Disp: 90 tablet, Rfl: 3 .  Cholecalciferol (VITAMIN D) 2000 UNITS tablet, Take 2,000 Units by mouth daily. , Disp: , Rfl:  .  ezetimibe (ZETIA) 10 MG tablet, TAKE 1 TABLET BY MOUTH DAILY, Disp: 30 tablet, Rfl: 0 .  ibuprofen (ADVIL,MOTRIN) 200 MG tablet, Take 600 mg by mouth daily as needed for moderate pain., Disp: , Rfl:  .  isosorbide mononitrate (IMDUR) 30 MG 24 hr tablet, TAKE 1 TABLET BY MOUTH DAILY, Disp: 30 tablet, Rfl: 5 .  losartan (COZAAR) 50 MG tablet, TAKE 1 TABLET BY MOUTH DAILY, Disp: 30 tablet, Rfl: 11 .  metoprolol tartrate (LOPRESSOR) 25 MG tablet, TAKE ONE TABLET TWICE DAILY, Disp: 180 tablet, Rfl:  3 .  rosuvastatin (CRESTOR) 5 MG tablet, TAKE ONE TABLET BY MOUTH DAILY, Disp: 90 tablet, Rfl: 3 .  azithromycin (ZITHROMAX) 250 MG tablet, 2 p.o. on day 1 and then 1 p.o. days 2 through 5. (Patient not taking: Reported on 08/27/2019), Disp: 6 tablet, Rfl: 0 .  nitroGLYCERIN (NITROSTAT) 0.4 MG SL tablet, Place 1 tablet (0.4 mg total) under the tongue every 5 (five) minutes as needed for chest pain. For a maximum of 3 doses., Disp: 25 tablet, Rfl: 2  Review of Systems  Constitutional: Negative for appetite change, chills and fever.  HENT: Negative.   Eyes: Negative.   Respiratory: Positive for cough, shortness of breath and wheezing. Negative for chest tightness.   Cardiovascular: Negative for chest pain and palpitations.  Gastrointestinal: Negative for abdominal pain, nausea and vomiting.  Endocrine: Negative.   Allergic/Immunologic: Negative.   Hematological: Negative.   Psychiatric/Behavioral: Negative.     Social History   Tobacco Use  . Smoking status: Former Smoker    Packs/day: 1.50    Years: 45.00    Pack years: 67.50    Types: Cigarettes    Quit date: 09/29/2014    Years since quitting: 4.9  . Smokeless tobacco: Never Used  Substance Use Topics  . Alcohol use: No    Comment: Quit 1990  Objective:   BP 116/65 (BP Location: Right Arm, Patient Position: Sitting, Cuff Size: Large)   Pulse 73   Temp (!) 97.1 F (36.2 C) (Other (Comment))   Resp 18   Ht 5\' 9"  (1.753 m)   Wt 197 lb (89.4 kg)   SpO2 96%   BMI 29.09 kg/m  Vitals:   08/27/19 1525  BP: 116/65  Pulse: 73  Resp: 18  Temp: (!) 97.1 F (36.2 C)  TempSrc: Other (Comment)  SpO2: 96%  Weight: 197 lb (89.4 kg)  Height: 5\' 9"  (1.753 m)  Body mass index is 29.09 kg/m.   Physical Exam Vitals reviewed.  Constitutional:      Appearance: Normal appearance. He is normal weight.  HENT:     Head: Normocephalic and atraumatic.     Nose: Nose normal.     Mouth/Throat:     Mouth: Mucous membranes are  moist.     Pharynx: Oropharynx is clear.  Eyes:     Extraocular Movements: Extraocular movements intact.     Conjunctiva/sclera: Conjunctivae normal.     Pupils: Pupils are equal, round, and reactive to light.  Cardiovascular:     Rate and Rhythm: Normal rate and regular rhythm.  Pulmonary:     Effort: Pulmonary effort is normal.     Breath sounds: Wheezing and rhonchi present.     Comments: Diffuse wheezes and rhonchi throughout. Abdominal:     General: Abdomen is flat. Bowel sounds are normal.     Palpations: Abdomen is soft.  Genitourinary:    Penis: Normal.      Testes: Normal.     Prostate: Normal.     Rectum: Normal.  Musculoskeletal:        General: Normal range of motion.     Cervical back: Normal range of motion.  Skin:    General: Skin is warm.  Neurological:     General: No focal deficit present.     Mental Status: He is alert and oriented to person, place, and time. Mental status is at baseline.  Psychiatric:        Mood and Affect: Mood normal.        Behavior: Behavior normal.        Thought Content: Thought content normal.        Judgment: Judgment normal.      No results found for any visits on 08/27/19.     Assessment & Plan    1. COPD with acute exacerbation (HCC) Try Breztri inhaler, 2 puffs twice a day.  Return to clinic 1 week.  Prednisone and albuterol inhaler as below. We are unable to get spirometry at this time due to Covid restrictions.  He is not describing any infection at this time is no fever chills or any other systemic symptoms.  He did complete a Z-Pak - predniSONE (DELTASONE) 10 MG tablet; Take 6 Tablets day 1, 5 Tablets day 2, 4 Tablets day 3, 3 Tablets day 4, 2 Tablets day 5, 1 Tablet day 6  Dispense: 21 tablet; Refill: 0 - Albuterol Sulfate 108 (90 Base) MCG/ACT AEPB; Inhale 1 puff into the lungs every morning.  Dispense: 1 each; Refill: 5  2. Coronary artery disease of native artery of native heart with stable angina pectoris  ([place]) All  ASCVD risk factors are treated.  I do not think this is angina. Patient quit smoking 5 years ago.     Richard Cranford Mon, MD  [facility]

## 2019-08-27 NOTE — Patient Instructions (Addendum)
Try Breztri inhaler 2 puffs in the morning and 2 puffs in the evening. Also use albuterol inhaler 1 puff every morning. Follow up next week as scheduled.

## 2019-09-05 ENCOUNTER — Ambulatory Visit: Payer: Self-pay | Admitting: Family Medicine

## 2019-09-05 NOTE — Progress Notes (Signed)
Patient: Christopher Villegas Male    DOB: 11/18/1955   64 y.o.   MRN: [id number] Visit Date: 09/06/2019  Today's Provider: Wilhemena Durie, MD   Chief Complaint  Patient presents with  . COPD  . Coronary Artery Disease   Subjective:     HPI   Patient presents today for a follow up on his cough. Patient says that his cough is now gone.  Cough  resolved after taking the prednisone.  He really has not had to use the inhaler.  He also has a rescue inhaler.  He now bikes regularly for exercise. COPD with acute exacerbation (HCC) From 08/27/2019-Try Breztri inhaler, 2 puffs twice a day.  Return to clinic 1 week.  Prednisone and albuterol inhaler as below. We are unable to get spirometry at this time due to Covid restrictions.  He is not describing any infection at this time is no fever chills or any other systemic symptoms.  He did complete a Z-Pak  Coronary artery disease of native artery of native heart with stable angina pectoris ([place]) From 08/27/2019-All  ASCVD risk factors are treated.  I do not think this is angina. Patient quit smoking 5 years ago.  Allergies  Allergen Reactions  . Plavix [Clopidogrel] Rash     Current Outpatient Medications:  .  Albuterol Sulfate 108 (90 Base) MCG/ACT AEPB, Inhale 1 puff into the lungs every morning., Disp: 1 each, Rfl: 5 .  aspirin EC 81 MG tablet, Take 1 tablet (81 mg total) by mouth daily., Disp: 90 tablet, Rfl: 3 .  azithromycin (ZITHROMAX) 250 MG tablet, 2 p.o. on day 1 and then 1 p.o. days 2 through 5. (Patient not taking: Reported on 08/27/2019), Disp: 6 tablet, Rfl: 0 .  Cholecalciferol (VITAMIN D) 2000 UNITS tablet, Take 2,000 Units by mouth daily. , Disp: , Rfl:  .  ezetimibe (ZETIA) 10 MG tablet, TAKE 1 TABLET BY MOUTH DAILY, Disp: 30 tablet, Rfl: 0 .  ibuprofen (ADVIL,MOTRIN) 200 MG tablet, Take 600 mg by mouth daily as needed for moderate pain., Disp: , Rfl:  .  isosorbide mononitrate (IMDUR) 30 MG 24 hr tablet, TAKE 1  TABLET BY MOUTH DAILY, Disp: 30 tablet, Rfl: 5 .  losartan (COZAAR) 50 MG tablet, TAKE 1 TABLET BY MOUTH DAILY, Disp: 30 tablet, Rfl: 11 .  metoprolol tartrate (LOPRESSOR) 25 MG tablet, TAKE ONE TABLET TWICE DAILY, Disp: 180 tablet, Rfl: 3 .  nitroGLYCERIN (NITROSTAT) 0.4 MG SL tablet, Place 1 tablet (0.4 mg total) under the tongue every 5 (five) minutes as needed for chest pain. For a maximum of 3 doses., Disp: 25 tablet, Rfl: 2 .  predniSONE (DELTASONE) 10 MG tablet, Take 6 Tablets day 1, 5 Tablets day 2, 4 Tablets day 3, 3 Tablets day 4, 2 Tablets day 5, 1 Tablet day 6, Disp: 21 tablet, Rfl: 0 .  rosuvastatin (CRESTOR) 5 MG tablet, TAKE ONE TABLET BY MOUTH DAILY, Disp: 90 tablet, Rfl: 3  Review of Systems  Constitutional: Negative for appetite change, chills and fever.  HENT: Negative.   Eyes: Negative.   Respiratory: Negative for chest tightness, shortness of breath and wheezing.   Cardiovascular: Negative for chest pain and palpitations.  Gastrointestinal: Negative for abdominal pain, nausea and vomiting.  Endocrine: Negative.   Allergic/Immunologic: Negative.   Neurological: Negative.   Hematological: Negative.   Psychiatric/Behavioral: Negative.     Social History   Tobacco Use  . Smoking status: Former Smoker  Packs/day: 1.50    Years: 45.00    Pack years: 67.50    Types: Cigarettes    Quit date: 09/29/2014    Years since quitting: 4.9  . Smokeless tobacco: Never Used  Substance Use Topics  . Alcohol use: No    Comment: Quit 1990      Objective:   There were no vitals taken for this visit. There were no vitals filed for this visit.There is no height or weight on file to calculate BMI.   Physical Exam Vitals reviewed.  Constitutional:      Appearance: Normal appearance. He is normal weight.  HENT:     Head: Normocephalic and atraumatic.     Nose: Nose normal.     Mouth/Throat:     Mouth: Mucous membranes are moist.     Pharynx: Oropharynx is clear.  Eyes:      Extraocular Movements: Extraocular movements intact.     Conjunctiva/sclera: Conjunctivae normal.     Pupils: Pupils are equal, round, and reactive to light.  Cardiovascular:     Rate and Rhythm: Normal rate and regular rhythm.  Pulmonary:     Effort: Pulmonary effort is normal.     Breath sounds: Normal breath sounds. No wheezing or rhonchi.  Abdominal:     General: Abdomen is flat. Bowel sounds are normal.     Palpations: Abdomen is soft.  Musculoskeletal:     Cervical back: Normal range of motion.  Skin:    General: Skin is warm and dry.  Neurological:     General: No focal deficit present.     Mental Status: He is alert and oriented to person, place, and time. Mental status is at baseline.  Psychiatric:        Mood and Affect: Mood normal.        Behavior: Behavior normal.        Thought Content: Thought content normal.        Judgment: Judgment normal.      No results found for any visits on 09/06/19.     Assessment & Plan    1. Cough   2. COPD with acute exacerbation [facility]) Patient is fine now that he is finished his prednisone.  We will stop the Breztri inhaler presently and use only the albuterol rescue inhaler before exercise or for any coughing or wheezing.  After Covid will get spirometry when appropriate.     Richard Cranford Mon, MD  [facility]

## 2019-09-06 ENCOUNTER — Ambulatory Visit: Payer: BC Managed Care – PPO | Admitting: Family Medicine

## 2019-09-06 ENCOUNTER — Encounter: Payer: Self-pay | Admitting: Family Medicine

## 2019-09-06 ENCOUNTER — Other Ambulatory Visit: Payer: Self-pay

## 2019-09-06 VITALS — BP 128/72 | HR 54 | Temp 96.8°F | Wt 198.4 lb

## 2019-09-06 DIAGNOSIS — J441 Chronic obstructive pulmonary disease with (acute) exacerbation: Secondary | ICD-10-CM | POA: Diagnosis not present

## 2019-09-06 DIAGNOSIS — R05 Cough: Secondary | ICD-10-CM | POA: Diagnosis not present

## 2019-09-06 DIAGNOSIS — R059 Cough, unspecified: Secondary | ICD-10-CM

## 2019-10-05 ENCOUNTER — Other Ambulatory Visit: Payer: Self-pay | Admitting: Family Medicine

## 2019-11-13 ENCOUNTER — Other Ambulatory Visit: Payer: Self-pay | Admitting: *Deleted

## 2019-11-13 MED ORDER — ISOSORBIDE MONONITRATE ER 30 MG PO TB24: 30.0000 mg | ORAL_TABLET | Freq: Every day | ORAL | 5 refills | Status: DC

## 2019-11-13 NOTE — Telephone Encounter (Signed)
Requested Prescriptions   Signed Prescriptions Disp Refills  . isosorbide mononitrate (IMDUR) 30 MG 24 hr tablet 30 tablet 5    Sig: Take 1 tablet (30 mg total) by mouth daily.    Authorizing Provider: Theora Gianotti    Ordering User: Britt Bottom

## 2019-11-26 DIAGNOSIS — M79601 Pain in right arm: Secondary | ICD-10-CM | POA: Diagnosis not present

## 2019-11-26 DIAGNOSIS — M25521 Pain in right elbow: Secondary | ICD-10-CM | POA: Diagnosis not present

## 2019-11-27 DIAGNOSIS — Z125 Encounter for screening for malignant neoplasm of prostate: Secondary | ICD-10-CM | POA: Diagnosis not present

## 2019-11-27 DIAGNOSIS — M25521 Pain in right elbow: Secondary | ICD-10-CM | POA: Diagnosis not present

## 2019-11-27 DIAGNOSIS — Z6828 Body mass index (BMI) 28.0-28.9, adult: Secondary | ICD-10-CM | POA: Diagnosis not present

## 2019-11-29 DIAGNOSIS — M25521 Pain in right elbow: Secondary | ICD-10-CM | POA: Diagnosis not present

## 2019-11-29 DIAGNOSIS — S46291D Other injury of muscle, fascia and tendon of other parts of biceps, right arm, subsequent encounter: Secondary | ICD-10-CM | POA: Diagnosis not present

## 2019-11-30 ENCOUNTER — Telehealth: Payer: Self-pay | Admitting: Internal Medicine

## 2019-11-30 NOTE — Telephone Encounter (Signed)
   [facility] Pre-operative Risk Assessment    HEARTCARE STAFF: - Please ensure there is not already an duplicate clearance open for this procedure. - Under Visit Info/Reason for Call, type in Other and utilize the format Clearance MM/DD/YY or Clearance TBD. Do not use dashes or single digits. - If request is for dental extraction, please clarify the # of teeth to be extracted.  Request for surgical clearance:  1. What type of surgery is being performed? Bicep Tendon Repair   2. When is this surgery scheduled? 12/03/19 1:30 pm  3. What type of clearance is required (medical clearance vs. Pharmacy clearance to hold med vs. Both)? Both  4. Are there any medications that need to be held prior to surgery and how long?Pt takes a baby aspirin every other day  5. Practice name and name of physician performing surgery? Dr. Caralyn Guile, [facility]   6. What is the office phone number? [phone]   7.   What is the office fax number? [phone]  8.   Anesthesia type (None, local, MAC, general) ? Regional with sedation    Christopher Villegas 11/30/2019, 9:19 AM  _________________________________________________________________   (provider comments below)

## 2019-11-30 NOTE — Telephone Encounter (Signed)
   Primary Cardiologist:Christopher End, MD  Chart reviewed as part of pre-operative protocol coverage. Because of Christopher Villegas's past medical history and time since last visit, they will require a follow-up visit in order to better assess preoperative cardiovascular risk.  Pre-op covering staff: - Please schedule appointment and call patient to inform them. If patient already had an upcoming appointment within acceptable timeframe, please add "pre-op clearance" to the appointment notes so provider is aware. - Please contact requesting surgeon's office via preferred method (i.e, phone, fax) to inform them of need for appointment prior to surgery.  If applicable, this message will also be routed to pharmacy pool and/or primary cardiologist for input on holding anticoagulant/antiplatelet agent as requested below so that this information is available to the clearing provider at time of patient's appointment.   Tami Lin Mikenzi Raysor, PA  11/30/2019, 10:10 AM

## 2019-11-30 NOTE — Telephone Encounter (Signed)
Patient declined to schedule an appt. On 6/24 with End as his surgery is on 6/21 .    Patient states Dr. Saunders Revel should not need to see him in the office for this clearance and he is requesting Dr. Saunders Revel give clearance to do needed procedure without appt .   Patient understands that the note below states he needs an appt  for clearance but would like Dr. Saunders Revel to be asked directly as he doesn't feel it is needed and thinks Dr. Saunders Revel would agree.

## 2019-11-30 NOTE — Telephone Encounter (Signed)
See note from Dr. Saunders Revel giving clearance. I will fax notes to requesting office. I will remove from the pre op call back pool.

## 2019-11-30 NOTE — Telephone Encounter (Signed)
Christopher Villegas has been doing well from a heart standpoint since our last visit in 04/2019.  He denies chest pain and shortness of breath.  He continues to exercise regularly without limitations.  He is taking ASA 81 mg every other day (last dose this morning).  I think it is reasonable for him to proceed with planned orthopedic procedure next week without further cardiac testing or intervention, as it is a low-risk procedure.  Aspirin can be held until his surgeon feels it is safe to restart the medication.  Nelva Bush, MD [facility]

## 2019-12-03 DIAGNOSIS — G8918 Other acute postprocedural pain: Secondary | ICD-10-CM | POA: Diagnosis not present

## 2019-12-03 DIAGNOSIS — S46291A Other injury of muscle, fascia and tendon of other parts of biceps, right arm, initial encounter: Secondary | ICD-10-CM | POA: Diagnosis not present

## 2019-12-03 DIAGNOSIS — X58XXXA Exposure to other specified factors, initial encounter: Secondary | ICD-10-CM | POA: Diagnosis not present

## 2019-12-03 DIAGNOSIS — Y999 Unspecified external cause status: Secondary | ICD-10-CM | POA: Diagnosis not present

## 2019-12-18 DIAGNOSIS — M25621 Stiffness of right elbow, not elsewhere classified: Secondary | ICD-10-CM | POA: Diagnosis not present

## 2019-12-18 DIAGNOSIS — S46291D Other injury of muscle, fascia and tendon of other parts of biceps, right arm, subsequent encounter: Secondary | ICD-10-CM | POA: Diagnosis not present

## 2019-12-24 DIAGNOSIS — M25629 Stiffness of unspecified elbow, not elsewhere classified: Secondary | ICD-10-CM | POA: Diagnosis not present

## 2020-01-01 DIAGNOSIS — M25629 Stiffness of unspecified elbow, not elsewhere classified: Secondary | ICD-10-CM | POA: Diagnosis not present

## 2020-01-08 DIAGNOSIS — M25629 Stiffness of unspecified elbow, not elsewhere classified: Secondary | ICD-10-CM | POA: Diagnosis not present

## 2020-01-15 DIAGNOSIS — M25629 Stiffness of unspecified elbow, not elsewhere classified: Secondary | ICD-10-CM | POA: Diagnosis not present

## 2020-01-22 DIAGNOSIS — M25629 Stiffness of unspecified elbow, not elsewhere classified: Secondary | ICD-10-CM | POA: Diagnosis not present

## 2020-02-05 ENCOUNTER — Other Ambulatory Visit: Payer: Self-pay | Admitting: Family Medicine

## 2020-02-05 DIAGNOSIS — I10 Essential (primary) hypertension: Secondary | ICD-10-CM

## 2020-02-05 DIAGNOSIS — M25629 Stiffness of unspecified elbow, not elsewhere classified: Secondary | ICD-10-CM | POA: Diagnosis not present

## 2020-02-07 ENCOUNTER — Other Ambulatory Visit: Payer: Self-pay | Admitting: Internal Medicine

## 2020-03-18 DIAGNOSIS — L570 Actinic keratosis: Secondary | ICD-10-CM | POA: Diagnosis not present

## 2020-03-18 DIAGNOSIS — L814 Other melanin hyperpigmentation: Secondary | ICD-10-CM | POA: Diagnosis not present

## 2020-03-18 DIAGNOSIS — L821 Other seborrheic keratosis: Secondary | ICD-10-CM | POA: Diagnosis not present

## 2020-03-18 DIAGNOSIS — Z85828 Personal history of other malignant neoplasm of skin: Secondary | ICD-10-CM | POA: Diagnosis not present

## 2020-03-18 DIAGNOSIS — L578 Other skin changes due to chronic exposure to nonionizing radiation: Secondary | ICD-10-CM | POA: Diagnosis not present

## 2020-04-04 ENCOUNTER — Other Ambulatory Visit: Payer: Self-pay | Admitting: Family Medicine

## 2020-04-04 NOTE — Telephone Encounter (Signed)
Requested Prescriptions  Pending Prescriptions Disp Refills  . ezetimibe (ZETIA) 10 MG tablet [Pharmacy Med Name: EZETIMIBE 10 MG TAB] 60 tablet 0    Sig: TAKE 1 TABLET BY MOUTH DAILY     Cardiovascular:  Antilipid - Sterol Transport Inhibitors Failed - 04/04/2020  1:36 PM      Failed - LDL in normal range and within 360 days    LDL Cholesterol (Calc)  Date Value Ref Range Status  05/25/2017 115 (H) mg/dL (calc) Final    Comment:    Reference range: <100 . Desirable range <100 mg/dL for primary prevention;   <70 mg/dL for patients with CHD or diabetic patients  with > or = 2 CHD risk factors. Marland Kitchen LDL-C is now calculated using the Martin-Hopkins  calculation, which is a validated novel method providing  better accuracy than the Friedewald equation in the  estimation of LDL-C.  Cresenciano Genre et al. Annamaria Helling. 5400;867(61): 2061-2068  (http://education.[employer].com/faq/FAQ164)    LDL Chol Calc (NIH)  Date Value Ref Range Status  04/20/2019 75 0 - 99 mg/dL Final         Passed - Total Cholesterol in normal range and within 360 days    Cholesterol, Total  Date Value Ref Range Status  04/20/2019 150 100 - 199 mg/dL Final         Passed - HDL in normal range and within 360 days    HDL  Date Value Ref Range Status  04/20/2019 61 >39 mg/dL Final         Passed - Triglycerides in normal range and within 360 days    Triglycerides  Date Value Ref Range Status  04/20/2019 72 0 - 149 mg/dL Final         Passed - Valid encounter within last 12 months    Recent Outpatient Visits          7 months ago Cough   [facility] Jerrol Banana., MD   7 months ago COPD with acute exacerbation [facility])   [facility] Jerrol Banana., MD   7 months ago Pulmonary emphysema, unspecified emphysema type [facility])   [facility] Jerrol Banana., MD   1 year ago Annual physical exam   [facility] Jerrol Banana., MD    2 years ago Annual physical exam   [facility] Jerrol Banana., MD      Future Appointments            In 2 weeks End, Harrell Gave, MD [facility], [place]   In 1 month Jerrol Banana., MD [facility], [place]

## 2020-04-23 ENCOUNTER — Ambulatory Visit (INDEPENDENT_AMBULATORY_CARE_PROVIDER_SITE_OTHER): Payer: BC Managed Care – PPO | Admitting: Internal Medicine

## 2020-04-23 ENCOUNTER — Encounter: Payer: Self-pay | Admitting: Internal Medicine

## 2020-04-23 ENCOUNTER — Other Ambulatory Visit: Payer: Self-pay

## 2020-04-23 VITALS — BP 138/82 | HR 59 | Ht 69.0 in | Wt 199.0 lb

## 2020-04-23 DIAGNOSIS — E785 Hyperlipidemia, unspecified: Secondary | ICD-10-CM | POA: Diagnosis not present

## 2020-04-23 DIAGNOSIS — Z79899 Other long term (current) drug therapy: Secondary | ICD-10-CM | POA: Diagnosis not present

## 2020-04-23 DIAGNOSIS — I25118 Atherosclerotic heart disease of native coronary artery with other forms of angina pectoris: Secondary | ICD-10-CM | POA: Diagnosis not present

## 2020-04-23 DIAGNOSIS — I10 Essential (primary) hypertension: Secondary | ICD-10-CM

## 2020-04-23 NOTE — Progress Notes (Signed)
Follow-up Outpatient Visit Date: 04/23/2020  Primary Care Provider: Jerrol Banana., MD [address] [place] [address]  Chief Complaint: Follow-up coronary artery disease  HPI:  [name] is a 64 y.o. male with history of coronary artery disease with chronic total occlusion of the mid RCA managed medically, hypertension, hyperlipidemia, and carotid artery stenosis status post right carotid endarterectomy, who presents for follow-up of coronary artery disease.  Today, Mr. Fogel reports that he is doing fairly well.  He notes having a persistent cough for a few months after receiving his first COVID-19 vaccination earlier this year.  This ultimately resolved after a short course of steroids and use of a inhaler.  He notes occasional indigestion after eating certain foods, which has been longstanding.  He otherwise denies chest pain, shortness of breath, edema, palpitations, and bleeding.  --------------------------------------------------------------------------------------------------  Cardiovascular History & Procedures: Cardiovascular Problems:  Coronary artery disease with stableangina  Risk Factors:  Cerebrovascular disease, hypertension, hyperlipidemia, male gender, obesity, age greater than 54, and history of tobacco use.  Cath/PCI:  LHC (07/01/17): LMCA with 30% ostial and distal lesions. LAD with 30% midvessel stenosis. LCX with 20% ostial and 40% proximal lesions. RCA with 40% diffuse midstenosis and chronic total occlusion of the mid/distal RCA. Left-to-right and right-to-right collaterals are present  CV Surgery:  Right carotid endarterectomy (12/2007, Dr. Lucky Cowboy)  EP Procedures and Devices:  None  Non-Invasive Evaluation(s):  Carotid Doppler (05/11/17): Patent right carotid endarterectomy site with no significant internal carotid artery stenosis. Mild left proximal internal carotid artery disease (less than 40%). No significant change from  prior study in 04/2016.  Exercise MPI (11/12/15): No ischemia or scar. LVEF mildly reduced at 45%. Good exercise capacity.  TTE (11/12/15): Normal biventricular systolic function (LVEF 95%). Mild TR and MR.  Recent CV Pertinent Labs: Lab Results  Component Value Date   CHOL 150 04/20/2019   HDL 61 04/20/2019   LDLCALC 75 04/20/2019   LDLCALC 115 (H) 05/25/2017   TRIG 72 04/20/2019   CHOLHDL 2.5 04/20/2019   CHOLHDL 2.4 08/08/2017   INR 1.0 06/22/2017   K 5.0 04/20/2019   BUN 17 04/20/2019   CREATININE 1.24 04/20/2019   CREATININE 1.32 (H) 04/06/2017    Past medical and surgical history were reviewed and updated in EPIC.  Current Meds  Medication Sig  . Albuterol Sulfate 108 (90 Base) MCG/ACT AEPB Inhale 1 puff into the lungs every morning.  Marland Kitchen aspirin EC 81 MG tablet Take 1 tablet (81 mg total) by mouth daily.  . Cholecalciferol (VITAMIN D) 2000 UNITS tablet Take 2,000 Units by mouth daily.   Marland Kitchen ezetimibe (ZETIA) 10 MG tablet TAKE 1 TABLET BY MOUTH DAILY  . ibuprofen (ADVIL,MOTRIN) 200 MG tablet Take 600 mg by mouth daily as needed for moderate pain.  . isosorbide mononitrate (IMDUR) 30 MG 24 hr tablet Take 1 tablet (30 mg total) by mouth daily.  Marland Kitchen losartan (COZAAR) 50 MG tablet TAKE 1 TABLET BY MOUTH DAILY  . metoprolol tartrate (LOPRESSOR) 25 MG tablet TAKE ONE TABLET TWICE DAILY  . rosuvastatin (CRESTOR) 5 MG tablet TAKE ONE TABLET BY MOUTH DAILY    Allergies: Plavix [clopidogrel]  Social History   Tobacco Use  . Smoking status: Former Smoker    Packs/day: 1.50    Years: 45.00    Pack years: 67.50    Types: Cigarettes    Quit date: 09/29/2014    Years since quitting: 5.5  . Smokeless tobacco: Never Used  Vaping Use  . Vaping Use: Never used  Substance Use Topics  . Alcohol use: No    Comment: Quit 1990  . Drug use: No    Family History  Problem Relation Age of Onset  . Cancer Mother        breast  . Hypertension Mother   . Hyperlipidemia Mother   .  Arthritis Mother        osteo  . Hypertension Father   . Heart disease Father        CHF  . Kidney failure Father        causing death  . Autoimmune disease Sister   . Lupus Sister   . Healthy Son   . Healthy Son     Review of Systems: A 12-system review of systems was performed and was negative except as noted in the HPI.  --------------------------------------------------------------------------------------------------  Physical Exam: BP 138/82 (BP Location: Left Arm, Patient Position: Sitting, Cuff Size: Normal)   Pulse (!) 59   Ht 5\' 9"  (1.753 m)   Wt 199 lb (90.3 kg)   SpO2 98%   BMI 29.39 kg/m  Repeat BP 126/72  General: NAD. Neck: No JVD or HJR. Lungs: Clear to auscultation bilaterally without wheezes or crackles. Heart: Bradycardic but regular without murmurs, rubs, or gallops. Abdomen: Soft, nontender, nondistended. Extremities: No lower extremity edema.  EKG: Sinus bradycardia (heart rate 59 bpm).  Otherwise, no significant abnormality.  Lab Results  Component Value Date   WBC 7.4 04/20/2019   HGB 14.3 04/20/2019   HCT 41.3 04/20/2019   MCV 93 04/20/2019   PLT 233 04/20/2019    Lab Results  Component Value Date   NA 140 04/20/2019   K 5.0 04/20/2019   CL 105 04/20/2019   CO2 22 04/20/2019   BUN 17 04/20/2019   CREATININE 1.24 04/20/2019   GLUCOSE 91 04/20/2019   ALT 28 04/20/2019    Lab Results  Component Value Date   CHOL 150 04/20/2019   HDL 61 04/20/2019   LDLCALC 75 04/20/2019   TRIG 72 04/20/2019   CHOLHDL 2.5 04/20/2019    --------------------------------------------------------------------------------------------------  ASSESSMENT AND PLAN: Coronary artery disease with stable angina: Mr. Palau continues to do well without chest pain or shortness of breath.  We will continue his current antianginal regimen consisting of isosorbide mononitrate 30 mg daily and metoprolol tartrate 25 mg twice daily.  I will check a CBC, CMP, and lipid  panel today.  Hyperlipidemia: Mr. Jentz is tolerating low-dose rosuvastatin and ezetimibe well.  We will check a CMP, lipid panel, and creatine kinase today.  If LDL is above 70, escalation of rosuvastatin will need to be considered.  Hypertension: Initial blood pressure is borderline today, though improved on repeat.  Continue current regimen of isosorbide mononitrate, losartan, and metoprolol.  Follow-up: Return to clinic in 1 year.  Nelva Bush, MD 04/23/2020 11:15 AM

## 2020-04-23 NOTE — Patient Instructions (Signed)
Medication Instructions:  Your physician recommends that you continue on your current medications as directed. Please refer to the Current Medication list given to you today.  *If you need a refill on your cardiac medications before your next appointment, please call your pharmacy*    Lab Work:  Your physician recommends that you have lab work TODAY: Cmet, CBC, Lipid panel, Total CK  If you have labs (blood work) drawn today and your tests are completely normal, you will receive your results only by: Marland Kitchen MyChart Message (if you have MyChart) OR . A paper copy in the mail If you have any lab test that is abnormal or we need to change your treatment, we will call you to review the results.   Testing/Procedures: None ordered.   Follow-Up: At [facility], you and your health needs are our priority.  As part of our continuing mission to provide you with exceptional heart care, we have created designated Provider Care Teams.  These Care Teams include your primary Cardiologist (physician) and Advanced Practice Providers (APPs -  Physician Assistants and Nurse Practitioners) who all work together to provide you with the care you need, when you need it.  We recommend signing up for the patient portal called "MyChart".  Sign up information is provided on this After Visit Summary.  MyChart is used to connect with patients for Virtual Visits (Telemedicine).  Patients are able to view lab/test results, encounter notes, upcoming appointments, etc.  Non-urgent messages can be sent to your provider as well.   To learn more about what you can do with MyChart, go to [email or web address].    Your next appointment:   1 year(s)  The format for your next appointment:   In Person  Provider:   You may see Nelva Bush, MD or one of the following Advanced Practice Providers on your designated Care Team:    Murray Hodgkins, NP  Christell Faith, PA-C  Marrianne Mood, PA-C  Cadence Dike,  [place]

## 2020-04-24 ENCOUNTER — Other Ambulatory Visit: Payer: Self-pay | Admitting: *Deleted

## 2020-04-24 DIAGNOSIS — Z87891 Personal history of nicotine dependence: Secondary | ICD-10-CM

## 2020-04-24 DIAGNOSIS — Z122 Encounter for screening for malignant neoplasm of respiratory organs: Secondary | ICD-10-CM

## 2020-04-24 LAB — LIPID PANEL
Chol/HDL Ratio: 2.7 ratio (ref 0.0–5.0)
Cholesterol, Total: 144 mg/dL (ref 100–199)
HDL: 53 mg/dL (ref >39)
LDL Chol Calc (NIH): 76 mg/dL (ref 0–99)
Triglycerides: 75 mg/dL (ref 0–149)
VLDL Cholesterol Cal: 15 mg/dL (ref 5–40)

## 2020-04-24 LAB — COMPREHENSIVE METABOLIC PANEL
ALT: 24 IU/L (ref 0–44)
AST: 32 IU/L (ref 0–40)
Albumin/Globulin Ratio: 1.8 (ref 1.2–2.2)
Albumin: 4.6 g/dL (ref 3.8–4.8)
Alkaline Phosphatase: 59 IU/L (ref 44–121)
BUN/Creatinine Ratio: 16 (ref 10–24)
BUN: 23 mg/dL (ref 8–27)
Bilirubin Total: 0.6 mg/dL (ref 0.0–1.2)
CO2: 22 mmol/L (ref 20–29)
Calcium: 9.2 mg/dL (ref 8.6–10.2)
Chloride: 101 mmol/L (ref 96–106)
Creatinine, Ser: 1.41 mg/dL — ABNORMAL HIGH (ref 0.76–1.27)
GFR calc Af Amer: 60 mL/min/{1.73_m2} (ref >59)
GFR calc non Af Amer: 52 mL/min/{1.73_m2} — ABNORMAL LOW (ref >59)
Globulin, Total: 2.6 g/dL (ref 1.5–4.5)
Glucose: 89 mg/dL (ref 65–99)
Potassium: 5 mmol/L (ref 3.5–5.2)
Sodium: 137 mmol/L (ref 134–144)
Total Protein: 7.2 g/dL (ref 6.0–8.5)

## 2020-04-24 LAB — CBC
Hematocrit: 44.4 % (ref 37.5–51.0)
Hemoglobin: 15.3 g/dL (ref 13.0–17.7)
MCH: 31.4 pg (ref 26.6–33.0)
MCHC: 34.5 g/dL (ref 31.5–35.7)
MCV: 91 fL (ref 79–97)
Platelets: 259 10*3/uL (ref 150–450)
RBC: 4.87 x10E6/uL (ref 4.14–5.80)
RDW: 12.1 % (ref 11.6–15.4)
WBC: 6.2 10*3/uL (ref 3.4–10.8)

## 2020-04-24 LAB — CK TOTAL AND CKMB (NOT AT ARMC)
CK-MB Index: 3.2 ng/mL (ref 0.0–10.4)
Total CK: 267 U/L (ref 41–331)

## 2020-04-24 NOTE — Progress Notes (Signed)
Contacted and scheduled for lung screening scan. Patient is a former smoker, quit 09/29/14, 67.5 pack year

## 2020-04-25 ENCOUNTER — Encounter: Payer: Self-pay | Admitting: Internal Medicine

## 2020-04-25 ENCOUNTER — Telehealth: Payer: Self-pay | Admitting: *Deleted

## 2020-04-25 DIAGNOSIS — I25118 Atherosclerotic heart disease of native coronary artery with other forms of angina pectoris: Secondary | ICD-10-CM

## 2020-04-25 DIAGNOSIS — I10 Essential (primary) hypertension: Secondary | ICD-10-CM

## 2020-04-25 DIAGNOSIS — Z79899 Other long term (current) drug therapy: Secondary | ICD-10-CM

## 2020-04-25 NOTE — Telephone Encounter (Signed)
[facility] 2 weeks. BMET order entered.

## 2020-04-25 NOTE — Telephone Encounter (Signed)
-----   Message from Nelva Bush, MD sent at 04/25/2020  7:54 AM EST ----- Please let [name] know that his counts normal.  Creatinine slightly above baseline with normal electrolytes and liver function.  Lipid panel notable for LDL just above goal at 76, similar compared to last year.  Creatine kinase remains within the upper normal range.  If he is agreeable, I suggest increasing rosuvastatin to 10 mg daily, with repeat lipid panel, ALT, and creatine kinase in about 3 months.

## 2020-04-25 NOTE — Telephone Encounter (Signed)
Results called to pt. Pt verbalized understanding. Patient would like to go off Crestor altogether. Patient is concerned about his lab numbers and creatinine kinase and does not want to see all these numbers mess him up. He would like to work on diet and exercise and recheck in 3 months and see if that helps him. I explained that the majority of his lab work looked fine and he still was adamant he would not increase the rosuvastatin at this time. Advised him I will make Dr End aware and I will casll him back with any further advice and explanation from Dr End. He was appreciative that I would pass his message on to Dr End for him.

## 2020-04-25 NOTE — Telephone Encounter (Signed)
I spoke with [name] regarding the results of his labs and his concerns about continued statin use.  He states that he was worried about rising creatinine and decreased GFR.  I advised him that it would be unusual for statin to cause renal dysfunction and that we have been following his creatinine kinase in the setting of statin use, as he has had elevated CK levels in the past.  His mildly elevated creatinine could be a result of dehydration, given that he had been fasting for an extended period before this blood draw.  Before making any medication changes, I have recommended that we repeat a basic metabolic panel in about 2 weeks after Christopher Villegas has worked on increasing his water intake.  We will continue his current regimen including rosuvastatin 5 mg daily and ezetimibe 10 mg daily.  If his renal function continues to decline, we will refer him to nephrology.  I encouraged Christopher Villegas to minimize his NSAID use, as this would be the most likely culprit for worsening renal insufficiency.  Nelva Bush, MD [facility]

## 2020-04-29 ENCOUNTER — Other Ambulatory Visit: Payer: Self-pay

## 2020-04-29 ENCOUNTER — Ambulatory Visit
Admission: RE | Admit: 2020-04-29 | Discharge: 2020-04-29 | Disposition: A | Discharge status: Home / Self Care | Payer: BC Managed Care – PPO | Source: Ambulatory Visit | Attending: Nurse Practitioner | Admitting: Nurse Practitioner

## 2020-04-29 DIAGNOSIS — Z122 Encounter for screening for malignant neoplasm of respiratory organs: Secondary | ICD-10-CM | POA: Diagnosis not present

## 2020-04-29 DIAGNOSIS — Z87891 Personal history of nicotine dependence: Secondary | ICD-10-CM | POA: Diagnosis not present

## 2020-05-01 ENCOUNTER — Telehealth: Payer: Self-pay | Admitting: *Deleted

## 2020-05-01 NOTE — Telephone Encounter (Signed)
Notified patient of LDCT lung cancer screening program results with recommendation for 12 month follow up imaging. Also notified of incidental findings noted below and is encouraged to discuss further with PCP who will receive a copy of this note and/or the CT report. Patient verbalizes understanding.   IMPRESSION: 1. Lung-RADS 2S, benign appearance or behavior. Continue annual screening with low-dose chest CT without contrast in 12 months. 2. The "S" modifier above refers to potentially clinically significant non lung cancer related findings. Specifically, there is aortic atherosclerosis, in addition to left main and 3 vessel coronary artery disease. Please note that although the presence of coronary artery calcium documents the presence of coronary artery disease, the severity of this disease and any potential stenosis cannot be assessed on this non-gated CT examination. Assessment for potential risk factor modification, dietary therapy or pharmacologic therapy may be warranted, if clinically indicated. 3. Mild diffuse bronchial wall thickening with very mild centrilobular and paraseptal emphysema; imaging findings suggestive of underlying COPD.  Aortic Atherosclerosis (ICD10-I70.0) and Emphysema (ICD10-J43.9). 

## 2020-05-03 ENCOUNTER — Other Ambulatory Visit: Payer: Self-pay | Admitting: Family Medicine

## 2020-05-05 ENCOUNTER — Other Ambulatory Visit: Payer: Self-pay | Admitting: *Deleted

## 2020-05-05 MED ORDER — ISOSORBIDE MONONITRATE ER 30 MG PO TB24
30.0000 mg | ORAL_TABLET | Freq: Every day | ORAL | 5 refills | Status: DC
Start: 2020-05-05 — End: 2020-11-04

## 2020-05-12 ENCOUNTER — Other Ambulatory Visit
Admission: RE | Admit: 2020-05-12 | Discharge: 2020-05-12 | Disposition: A | Discharge status: Home / Self Care | Payer: BC Managed Care – PPO | Source: Ambulatory Visit | Attending: Internal Medicine | Admitting: Internal Medicine

## 2020-05-12 DIAGNOSIS — I10 Essential (primary) hypertension: Secondary | ICD-10-CM

## 2020-05-12 DIAGNOSIS — Z79899 Other long term (current) drug therapy: Secondary | ICD-10-CM

## 2020-05-12 DIAGNOSIS — I25118 Atherosclerotic heart disease of native coronary artery with other forms of angina pectoris: Secondary | ICD-10-CM | POA: Diagnosis not present

## 2020-05-12 LAB — BASIC METABOLIC PANEL
Anion gap: 9 (ref 5–15)
BUN: 26 mg/dL — ABNORMAL HIGH (ref 8–23)
CO2: 21 mmol/L — ABNORMAL LOW (ref 22–32)
Calcium: 8.6 mg/dL — ABNORMAL LOW (ref 8.9–10.3)
Chloride: 104 mmol/L (ref 98–111)
Creatinine, Ser: 1.24 mg/dL (ref 0.61–1.24)
GFR, Estimated: >60 mL/min (ref >60)
Glucose, Bld: 108 mg/dL — ABNORMAL HIGH (ref 70–99)
Potassium: 4.1 mmol/L (ref 3.5–5.1)
Sodium: 134 mmol/L — ABNORMAL LOW (ref 135–145)

## 2020-05-14 ENCOUNTER — Telehealth: Payer: Self-pay | Admitting: *Deleted

## 2020-05-14 ENCOUNTER — Other Ambulatory Visit: Payer: Self-pay | Admitting: *Deleted

## 2020-05-14 DIAGNOSIS — E785 Hyperlipidemia, unspecified: Secondary | ICD-10-CM

## 2020-05-14 MED ORDER — ROSUVASTATIN CALCIUM 10 MG PO TABS
10.0000 mg | ORAL_TABLET | Freq: Every day | ORAL | 3 refills | Status: DC
Start: 2020-05-14 — End: 2021-05-18

## 2020-05-14 NOTE — Telephone Encounter (Signed)
-----   Message from Nelva Bush, MD sent at 05/14/2020  6:42 AM EST ----- Please let Christopher Villegas know that his repeat BMP shows slight improvement in his creatinine and kidney function.  I would encourage him to reconsider escalation of rosuvastatin to 10 mg daily to help lower his LDL and reduce his risk for progression of his coronary artery disease.

## 2020-05-14 NOTE — Telephone Encounter (Signed)
Results released to My Chart. No answer. Left detailed message with results and that they're on MyChart, ok per DPR, and to call back if any questions.

## 2020-05-28 NOTE — Progress Notes (Signed)
I,April Miller,acting as a scribe for Wilhemena Durie, MD.,have documented all relevant documentation on the behalf of Wilhemena Durie, MD,as directed by  Wilhemena Durie, MD while in the presence of Wilhemena Durie, MD.   Complete physical exam   Patient: Christopher Villegas   DOB: 1956-05-19   64 y.o. Male  MRN: [id number] Visit Date: 05/29/2020  Today's healthcare provider: Wilhemena Durie, MD   Chief Complaint  Patient presents with  . Annual Exam   Subjective    Christopher Villegas is a 64 y.o. male who presents today for a complete physical exam.  He reports consuming a general diet. Home exercise routine includes staionary bike. He generally feels well. He reports sleeping well. He does not have additional problems to discuss today.  HPI    Past Medical History:  Diagnosis Date  . Carotid artery occlusion   . GERD (gastroesophageal reflux disease)   . Hyperlipidemia   . Hypertension    Past Surgical History:  Procedure Laterality Date  . CAROTID ENDARTERECTOMY Right 2009  . COLONOSCOPY WITH PROPOFOL N/A 06/16/2017   Procedure: COLONOSCOPY WITH PROPOFOL;  Surgeon: Lucilla Lame, MD;  Location: [place];  Service: Endoscopy;  Laterality: N/A;  . CYST EXCISION PERINEAL    . ESOPHAGOGASTRODUODENOSCOPY (EGD) WITH PROPOFOL N/A 06/16/2017   Procedure: ESOPHAGOGASTRODUODENOSCOPY (EGD) WITH PROPOFOL;  Surgeon: Lucilla Lame, MD;  Location: [place];  Service: Endoscopy;  Laterality: N/A;  . LEFT HEART CATH AND CORONARY ANGIOGRAPHY N/A 07/01/2017   Procedure: LEFT HEART CATH AND CORONARY ANGIOGRAPHY;  Surgeon: Nelva Bush, MD;  Location: [place] [facility];  Service: Cardiovascular;  Laterality: N/A;  . POLYPECTOMY  06/16/2017   Procedure: POLYPECTOMY;  Surgeon: Lucilla Lame, MD;  Location: [facility];  Service: Endoscopy;;  . TONSILLECTOMY AND ADENOIDECTOMY     Social History   Socioeconomic History  . Marital status:  Married    Spouse name: Corporate treasurer  . Number of children: 2  . Years of education: 14  . Highest education level: Not on file  Occupational History  . Occupation: [employer]  Tobacco Use  . Smoking status: Former Smoker    Packs/day: 1.50    Years: 45.00    Pack years: 67.50    Types: Cigarettes    Quit date: 09/29/2014    Years since quitting: 5.6  . Smokeless tobacco: Never Used  Vaping Use  . Vaping Use: Never used  Substance and Sexual Activity  . Alcohol use: No    Comment: Quit 1990  . Drug use: No  . Sexual activity: Yes  Other Topics Concern  . Not on file  Social History Narrative  . Not on file   Social Determinants of Health   Financial Resource Strain: Not on file  Food Insecurity: Not on file  Transportation Needs: Not on file  Physical Activity: Not on file  Stress: Not on file  Social Connections: Not on file  Intimate Partner Violence: Not on file   Family Status  Relation Name Status  . Mother  Alive  . Father  Deceased  . Sister  Alive  . Son  Alive  . Son  Alive   Family History  Problem Relation Age of Onset  . Cancer Mother        breast  . Hypertension Mother   . Hyperlipidemia Mother   . Arthritis Mother        osteo  .  Hypertension Father   . Heart disease Father        CHF  . Kidney failure Father        causing death  . Autoimmune disease Sister   . Lupus Sister   . Healthy Son   . Healthy Son    Allergies  Allergen Reactions  . Plavix [Clopidogrel] Rash    Patient Care Team: Jerrol Banana., MD as PCP - General (Family Medicine) End, Harrell Gave, MD as PCP - Cardiology (Cardiology)   Medications: Outpatient Medications Prior to Visit  Medication Sig  . Albuterol Sulfate 108 (90 Base) MCG/ACT AEPB Inhale 1 puff into the lungs every morning.  Marland Kitchen aspirin EC 81 MG tablet Take 1 tablet (81 mg total) by mouth daily.  Marland Kitchen ezetimibe (ZETIA) 10 MG tablet TAKE 1 TABLET BY MOUTH DAILY  . isosorbide  mononitrate (IMDUR) 30 MG 24 hr tablet Take 1 tablet (30 mg total) by mouth daily.  Marland Kitchen losartan (COZAAR) 50 MG tablet TAKE 1 TABLET BY MOUTH DAILY  . metoprolol tartrate (LOPRESSOR) 25 MG tablet TAKE ONE TABLET TWICE DAILY  . rosuvastatin (CRESTOR) 10 MG tablet Take 1 tablet (10 mg total) by mouth daily.  . Cholecalciferol (VITAMIN D) 2000 UNITS tablet Take 2,000 Units by mouth daily.   Marland Kitchen ibuprofen (ADVIL,MOTRIN) 200 MG tablet Take 600 mg by mouth daily as needed for moderate pain.  . nitroGLYCERIN (NITROSTAT) 0.4 MG SL tablet Place 1 tablet (0.4 mg total) under the tongue every 5 (five) minutes as needed for chest pain. For a maximum of 3 doses.   No facility-administered medications prior to visit.    Review of Systems  All other systems reviewed and are negative.   128/67   Objective    BP 128/67 (BP Location: Left Arm, Patient Position: Sitting, Cuff Size: Large)   Pulse 61   Temp 97.7 F (36.5 C) (Oral)   Resp 16   Ht 5\' 9"  (1.753 m)   Wt 204 lb (92.5 kg)   SpO2 100%   BMI 30.13 kg/m  BP Readings from Last 3 Encounters:  05/29/20 128/67  04/23/20 138/82  09/06/19 128/72   Wt Readings from Last 3 Encounters:  05/29/20 204 lb (92.5 kg)  04/29/20 195 lb (88.5 kg)  04/23/20 199 lb (90.3 kg)      Physical Exam Vitals reviewed.  Constitutional:      Appearance: Normal appearance. He is normal weight.  HENT:     Head: Normocephalic and atraumatic.     Nose: Nose normal.     Mouth/Throat:     Mouth: Mucous membranes are moist.     Pharynx: Oropharynx is clear.  Eyes:     Extraocular Movements: Extraocular movements intact.     Conjunctiva/sclera: Conjunctivae normal.     Pupils: Pupils are equal, round, and reactive to light.  Cardiovascular:     Rate and Rhythm: Normal rate and regular rhythm.  Pulmonary:     Effort: Pulmonary effort is normal.     Breath sounds: Normal breath sounds.  Abdominal:     General: Abdomen is flat. Bowel sounds are normal.      Palpations: Abdomen is soft.  Genitourinary:    Penis: Normal.      Testes: Normal.     Prostate: Normal.     Rectum: Normal.  Musculoskeletal:        General: Normal range of motion.     Cervical back: Normal range of motion.  Skin:  General: Skin is warm.  Neurological:     General: No focal deficit present.     Mental Status: He is alert and oriented to person, place, and time. Mental status is at baseline.  Psychiatric:        Mood and Affect: Mood normal.        Behavior: Behavior normal.        Thought Content: Thought content normal.        Judgment: Judgment normal.       Last depression screening scores PHQ 2/9 Scores 05/29/2020 05/29/2018 04/06/2017  PHQ - 2 Score 0 0 0  PHQ- 9 Score 0 - 0   Last fall risk screening Fall Risk  05/29/2020  Falls in the past year? 0  Number falls in past yr: 0  Injury with Fall? 0  Follow up Falls evaluation completed   Last Audit-C alcohol use screening Alcohol Use Disorder Test (AUDIT) 05/29/2020  1. How often do you have a drink containing alcohol? 0  2. How many drinks containing alcohol do you have on a typical day when you are drinking? 0  3. How often do you have six or more drinks on one occasion? 0  AUDIT-C Score 0  Alcohol Brief Interventions/Follow-up AUDIT Score <7 follow-up not indicated   A score of 3 or more in women, and 4 or more in men indicates increased risk for alcohol abuse, EXCEPT if all of the points are from question 1   No results found for any visits on 05/29/20.  Assessment & Plan    Routine Health Maintenance and Physical Exam  Exercise Activities and Dietary recommendations Goals   None     Immunization History  Administered Date(s) Administered  . Influenza,inj,Quad PF,6+ Mos 04/01/2016  . Influenza-Unspecified 03/15/2017  . Tdap 03/08/2008  . Zoster 04/23/2013  . Zoster Recombinat (Shingrix) 06/30/2017, 09/07/2017    Health Maintenance  Topic Date Due  . COVID-19 Vaccine (1)  Never done  . TETANUS/TDAP  03/08/2018  . PNA vac Low Risk Adult (1 of 2 - PCV13) Never done  . COLONOSCOPY  06/16/2022  . INFLUENZA VACCINE  Completed  . Hepatitis C Screening  Completed    Discussed health benefits of physical activity, and encouraged him to engage in regular exercise appropriate for his age and condition.  1. Annual physical exam PSA followed at [facility] in June.  Patient is Covid vaccinated.  2. Essential hypertension   3. Hyperlipidemia LDL goal <70 Status post right carotid endarterectomy.  4. Coronary artery disease of native artery of native heart with stable angina pectoris (HCC)   5. Stage 3a chronic kidney disease ([place])     Return in about 1 year (around 05/29/2021).        Debbi Strandberg Cranford Mon, MD  [facility] [phone] (phone) [phone] (fax)  [place]

## 2020-05-29 ENCOUNTER — Other Ambulatory Visit: Payer: Self-pay

## 2020-05-29 ENCOUNTER — Ambulatory Visit (INDEPENDENT_AMBULATORY_CARE_PROVIDER_SITE_OTHER): Payer: BC Managed Care – PPO | Admitting: Family Medicine

## 2020-05-29 ENCOUNTER — Encounter: Payer: Self-pay | Admitting: Family Medicine

## 2020-05-29 VITALS — BP 128/67 | HR 61 | Temp 97.7°F | Resp 16 | Ht 69.0 in | Wt 204.0 lb

## 2020-05-29 DIAGNOSIS — I10 Essential (primary) hypertension: Secondary | ICD-10-CM

## 2020-05-29 DIAGNOSIS — E785 Hyperlipidemia, unspecified: Secondary | ICD-10-CM

## 2020-05-29 DIAGNOSIS — Z0000 Encounter for general adult medical examination without abnormal findings: Secondary | ICD-10-CM

## 2020-05-29 DIAGNOSIS — I25118 Atherosclerotic heart disease of native coronary artery with other forms of angina pectoris: Secondary | ICD-10-CM | POA: Diagnosis not present

## 2020-05-29 DIAGNOSIS — I6523 Occlusion and stenosis of bilateral carotid arteries: Secondary | ICD-10-CM

## 2020-05-29 DIAGNOSIS — N1831 Chronic kidney disease, stage 3a: Secondary | ICD-10-CM

## 2020-08-18 ENCOUNTER — Telehealth: Payer: Self-pay | Admitting: Family Medicine

## 2020-08-18 ENCOUNTER — Encounter: Payer: Self-pay | Admitting: Family Medicine

## 2020-08-18 DIAGNOSIS — J441 Chronic obstructive pulmonary disease with (acute) exacerbation: Secondary | ICD-10-CM

## 2020-08-18 MED ORDER — BREZTRI AEROSPHERE 160-9-4.8 MCG/ACT IN AERO: 2.0000 | INHALATION_SPRAY | Freq: Two times a day (BID) | RESPIRATORY_TRACT | 3 refills | Status: DC

## 2020-08-18 NOTE — Telephone Encounter (Signed)
Pt is calling to request if Dr. Rosanna Randy could send a refill in for a inhaler that was given to him as a sample. Pt does not know the name of the inhaler is in not albuterol. It was given to the pt this time last year. Please advise CB- [phone] [facility]erred [facility]- [facility]

## 2020-08-18 NOTE — Telephone Encounter (Signed)
Pt is calling back and the name of inhaler breztri . [facility] in [place] phone number [phone]

## 2020-08-18 NOTE — Telephone Encounter (Signed)
Okay to send in prescription. 

## 2020-08-18 NOTE — Telephone Encounter (Signed)
From ov on 08/27/2019, patient was given Breztri inhaler. Please advise?

## 2020-08-18 NOTE — Telephone Encounter (Signed)
Rx sent to pharmacy   

## 2020-08-19 MED ORDER — PREDNISONE 10 MG (21) PO TBPK: ORAL_TABLET | ORAL | 0 refills | Status: DC

## 2020-08-20 ENCOUNTER — Other Ambulatory Visit: Payer: Self-pay | Admitting: Internal Medicine

## 2020-08-30 ENCOUNTER — Other Ambulatory Visit: Payer: Self-pay | Admitting: Internal Medicine

## 2020-09-05 ENCOUNTER — Other Ambulatory Visit: Payer: Self-pay | Admitting: Family Medicine

## 2020-09-05 NOTE — Telephone Encounter (Signed)
Requested Prescriptions  Pending Prescriptions Disp Refills  . ezetimibe (ZETIA) 10 MG tablet [Pharmacy Med Name: EZETIMIBE 10 MG TAB] 90 tablet 1    Sig: TAKE 1 TABLET BY MOUTH DAILY     Cardiovascular:  Antilipid - Sterol Transport Inhibitors Failed - 09/05/2020 12:11 PM      Failed - LDL in normal range and within 360 days    LDL Cholesterol (Calc)  Date Value Ref Range Status  05/25/2017 115 (H) mg/dL (calc) Final    Comment:    Reference range: <100 . Desirable range <100 mg/dL for primary prevention;   <70 mg/dL for patients with CHD or diabetic patients  with > or = 2 CHD risk factors. Marland Kitchen LDL-C is now calculated using the Martin-Hopkins  calculation, which is a validated novel method providing  better accuracy than the Friedewald equation in the  estimation of LDL-C.  Cresenciano Genre et al. Annamaria Helling. 3154;008(67): 2061-2068  (http://education.[employer].com/faq/FAQ164)    LDL Chol Calc (NIH)  Date Value Ref Range Status  04/23/2020 76 0 - 99 mg/dL Final         Passed - Total Cholesterol in normal range and within 360 days    Cholesterol, Total  Date Value Ref Range Status  04/23/2020 144 100 - 199 mg/dL Final         Passed - HDL in normal range and within 360 days    HDL  Date Value Ref Range Status  04/23/2020 53 >39 mg/dL Final         Passed - Triglycerides in normal range and within 360 days    Triglycerides  Date Value Ref Range Status  04/23/2020 75 0 - 149 mg/dL Final         Passed - Valid encounter within last 12 months    Recent Outpatient Visits          3 months ago Annual physical exam   [facility] Jerrol Banana., MD   1 year ago Cough   [facility] Jerrol Banana., MD   1 year ago COPD with acute exacerbation [facility])   [facility] Jerrol Banana., MD   1 year ago Pulmonary emphysema, unspecified emphysema type [facility])   [facility] Jerrol Banana., MD   2  years ago Annual physical exam   [facility] Jerrol Banana., MD      Future Appointments            In 8 months Jerrol Banana., MD [facility] [place]

## 2020-10-27 ENCOUNTER — Other Ambulatory Visit: Payer: Self-pay | Admitting: Family Medicine

## 2020-10-27 DIAGNOSIS — J441 Chronic obstructive pulmonary disease with (acute) exacerbation: Secondary | ICD-10-CM

## 2020-10-27 NOTE — Telephone Encounter (Signed)
  Notes to clinic:  this script has expired  Review for continued use and refill    Requested Prescriptions  Pending Prescriptions Disp Refills   albuterol (VENTOLIN HFA) 108 (90 Base) MCG/ACT inhaler [Pharmacy Med Name: ALBUTEROL SULFATE HFA 108 (90 BASE)] 8.5 g     Sig: INHALE 1 PUFF INTO THE LUNGS EVERY MORNING      Pulmonology:  Beta Agonists Failed - 10/27/2020  8:06 AM      Failed - One inhaler should last at least one month. If the patient is requesting refills earlier, contact the patient to check for uncontrolled symptoms.      Passed - Valid encounter within last 12 months    Recent Outpatient Visits           5 months ago Annual physical exam   [facility] Jerrol Banana., MD   1 year ago Cough   [facility] Jerrol Banana., MD   1 year ago COPD with acute exacerbation [facility])   [facility] Jerrol Banana., MD   1 year ago Pulmonary emphysema, unspecified emphysema type [facility])   [facility] Jerrol Banana., MD   2 years ago Annual physical exam   [facility] Jerrol Banana., MD       Future Appointments             In 7 months Jerrol Banana., MD [facility] [place]

## 2020-11-04 ENCOUNTER — Other Ambulatory Visit: Payer: Self-pay | Admitting: *Deleted

## 2020-11-04 MED ORDER — ISOSORBIDE MONONITRATE ER 30 MG PO TB24: 30.0000 mg | ORAL_TABLET | Freq: Every day | ORAL | 6 refills | Status: DC

## 2020-11-12 ENCOUNTER — Telehealth: Payer: Self-pay | Admitting: Internal Medicine

## 2020-11-19 ENCOUNTER — Ambulatory Visit: Payer: BC Managed Care – PPO | Admitting: Nurse Practitioner

## 2020-11-19 ENCOUNTER — Other Ambulatory Visit: Payer: Self-pay

## 2020-11-19 ENCOUNTER — Encounter: Payer: Self-pay | Admitting: Nurse Practitioner

## 2020-11-19 ENCOUNTER — Other Ambulatory Visit: Payer: BC Managed Care – PPO

## 2020-11-19 VITALS — BP 140/80 | HR 64 | Ht 69.0 in | Wt 208.4 lb

## 2020-11-19 DIAGNOSIS — I2510 Atherosclerotic heart disease of native coronary artery without angina pectoris: Secondary | ICD-10-CM | POA: Diagnosis not present

## 2020-11-19 DIAGNOSIS — E785 Hyperlipidemia, unspecified: Secondary | ICD-10-CM

## 2020-11-19 DIAGNOSIS — I25118 Atherosclerotic heart disease of native coronary artery with other forms of angina pectoris: Secondary | ICD-10-CM

## 2020-11-19 DIAGNOSIS — I10 Essential (primary) hypertension: Secondary | ICD-10-CM

## 2020-11-19 NOTE — Patient Instructions (Addendum)
Medication Instructions:  No changes at this time.  *If you need a refill on your cardiac medications before your next appointment, please call your pharmacy*   Lab Work: Lipid & LFT today  If you have labs (blood work) drawn today and your tests are completely normal, you will receive your results only by: Marland Kitchen MyChart Message (if you have MyChart) OR . A paper copy in the mail If you have any lab test that is abnormal or we need to change your treatment, we will call you to review the results.   Testing/Procedures: None   Follow-Up: At [facility], you and your health needs are our priority.  As part of our continuing mission to provide you with exceptional heart care, we have created designated Provider Care Teams.  These Care Teams include your primary Cardiologist (physician) and Advanced Practice Providers (APPs -  Physician Assistants and Nurse Practitioners) who all work together to provide you with the care you need, when you need it.   Your next appointment:   3 month(s)  The format for your next appointment:   In Person  Provider:   Nelva Bush, MD or Murray Hodgkins, NP

## 2020-11-19 NOTE — Progress Notes (Signed)
Office Visit    Patient Name: Christopher Villegas Date of Encounter: 11/19/2020  Primary Care Provider:  Jerrol Banana., MD Primary Cardiologist:  Nelva Bush, MD  Chief Complaint    65 year old male with a history of coronary artery disease, hypertension, hyperlipidemia, carotid arterial disease, and GERD, who presents for follow-up of CAD.  Past Medical History    Past Medical History:  Diagnosis Date  . CAD (coronary artery disease)    a. 06/2017 Cath: LM 30ost/d, LAD 87m, D1/2 large, nl, D3 small, LCX 20ost, 40p, OM1/2/3 nl, RCA 40/145m - CTO w/ L->R collats. RPDA fills via collats from 1st Septal. EF 55-65%-->Med rx (could consider CTO PCI for angina).  . Carotid arterial disease ([place])    a. 2009 s/p R CEA; b. 05/2018 U/S: RICA 2-84%, RECA >13%, LICA 2-44%, LECA <01%.  Marland Kitchen GERD (gastroesophageal reflux disease)   . History of echocardiogram    a. 10/2015 Echo: EF 55%. Mild TR/MR.  Marland Kitchen Hyperlipidemia   . Hypertension    Past Surgical History:  Procedure Laterality Date  . CAROTID ENDARTERECTOMY Right 2009  . COLONOSCOPY WITH PROPOFOL N/A 06/16/2017   Procedure: COLONOSCOPY WITH PROPOFOL;  Surgeon: Lucilla Lame, MD;  Location: [place];  Service: Endoscopy;  Laterality: N/A;  . CYST EXCISION PERINEAL    . ESOPHAGOGASTRODUODENOSCOPY (EGD) WITH PROPOFOL N/A 06/16/2017   Procedure: ESOPHAGOGASTRODUODENOSCOPY (EGD) WITH PROPOFOL;  Surgeon: Lucilla Lame, MD;  Location: [place];  Service: Endoscopy;  Laterality: N/A;  . LEFT HEART CATH AND CORONARY ANGIOGRAPHY N/A 07/01/2017   Procedure: LEFT HEART CATH AND CORONARY ANGIOGRAPHY;  Surgeon: Nelva Bush, MD;  Location: [place] [facility];  Service: Cardiovascular;  Laterality: N/A;  . POLYPECTOMY  06/16/2017   Procedure: POLYPECTOMY;  Surgeon: Lucilla Lame, MD;  Location: [facility];  Service: Endoscopy;;  . TONSILLECTOMY AND ADENOIDECTOMY      Allergies  Allergies  Allergen Reactions   . Plavix [Clopidogrel] Rash    History of Present Illness    65 year old male with above past medical history including coronary artery disease, hypertension, hyperlipidemia carotid arterial disease, and GERD.  He is status post right carotid endarterectomy in 2009 with 1 to 39% bilateral internal carotid artery stenoses on most recent ultrasound in 2019.  He previously underwent diagnostic catheterization in January 2009 in the setting of angina and was found to have a chronic total occlusion of the mid right coronary artery with left to right and right to right collaterals.  He otherwise had diffuse, nonobstructive disease and medical therapy was recommended, and he has been managed with beta-blocker and nitrate therapy.  He was last seen in cardiology clinic in November 2021, at which time he was doing well without chest pain or dyspnea.  Labs following that visit revealed total cholesterol 144 with an LDL of 76.  He was advised to increase his rosuvastatin from 5 mg to 10 mg daily.  He has not had follow-up labs since then.  He notes that since his last visit, his exercise regimen fell off quite a bit.  In that setting, he is gained about 13 pounds since last December.  He has recently begun riding his stationary bicycle again, typically for 40 minutes about 3-4 times per week.  He has noticed that in the setting of deconditioning, its been a little bit harder to get back into the routine of exercise but overall, he has been tolerating this well.  He does not experience chest  pain.  He wears a heart rate monitor watch and has noted simple acts like walking upstairs or carrying things will cause his heart rate to rise into the 90s, which is higher than he might have expected, when he was in better shape.  He denies palpitations, PND, orthopnea, dizziness, syncope, edema, or early satiety.  His blood pressure is 140/80 today, and he suspects that its been running this way recently, though he does not  routinely check at home.  He has expressed that he would prefer to adjust his lifestyle and lose weight prior to increasing any medications.  Home Medications    Prior to Admission medications   Medication Sig Start Date End Date Taking? Authorizing Provider  albuterol (VENTOLIN HFA) 108 (90 Base) MCG/ACT inhaler INHALE 1 PUFF INTO THE LUNGS EVERY MORNING 10/28/20   Jerrol Banana., MD  aspirin EC 81 MG tablet Take 1 tablet (81 mg total) by mouth daily. 06/22/17   End, Harrell Gave, MD  Budeson-Glycopyrrol-Formoterol (BREZTRI AEROSPHERE) 160-9-4.8 MCG/ACT AERO Inhale 2 puffs into the lungs in the morning and at bedtime. 08/18/20   Jerrol Banana., MD  Cholecalciferol (VITAMIN D) 2000 UNITS tablet Take 2,000 Units by mouth daily.  09/20/11   [provider]  ezetimibe (ZETIA) 10 MG tablet TAKE 1 TABLET BY MOUTH DAILY 09/05/20   Jerrol Banana., MD  ibuprofen (ADVIL,MOTRIN) 200 MG tablet Take 600 mg by mouth daily as needed for moderate pain.    [provider]  isosorbide mononitrate (IMDUR) 30 MG 24 hr tablet Take 1 tablet (30 mg total) by mouth daily. 11/04/20   Theora Gianotti, NP  losartan (COZAAR) 50 MG tablet TAKE 1 TABLET BY MOUTH DAILY 02/05/20   Jerrol Banana., MD  metoprolol tartrate (LOPRESSOR) 25 MG tablet TAKE ONE TABLET TWICE DAILY 09/01/20   End, Harrell Gave, MD  nitroGLYCERIN (NITROSTAT) 0.4 MG SL tablet Place 1 tablet (0.4 mg total) under the tongue every 5 (five) minutes as needed for chest pain. For a maximum of 3 doses. 06/22/17 04/20/19  End, Harrell Gave, MD  predniSONE (STERAPRED UNI-PAK 21 TAB) 10 MG (21) TBPK tablet Taper as directed. 08/19/20   Jerrol Banana., MD  rosuvastatin (CRESTOR) 10 MG tablet Take 1 tablet (10 mg total) by mouth daily. 05/14/20   End, Harrell Gave, MD    Review of Systems    He has begun exercising again and overall is tolerating it well.  He denies chest pain, dyspnea, palpitations, PND, orthopnea,  dizziness, syncope, edema, or early satiety.  All other systems reviewed and are otherwise negative except as noted above.  Physical Exam    VS:  BP 140/80 (BP Location: Left Arm, Patient Position: Sitting, Cuff Size: Normal)   Pulse 64   Ht 5\' 9"  (1.753 m)   Wt 208 lb 6 oz (94.5 kg)   SpO2 98%   BMI 30.77 kg/m  , BMI Body mass index is 30.77 kg/m. GEN: Well nourished, well developed, in no acute distress. HEENT: normal. Neck: Supple, no JVD, carotid bruits, or masses. Cardiac: RRR, no murmurs, rubs, or gallops. No clubbing, cyanosis, edema.  Radials/PT 2+ and equal bilaterally.  Respiratory:  Respirations regular and unlabored, clear to auscultation bilaterally. GI: Soft, nontender, nondistended, BS + x 4. MS: no deformity or atrophy. Skin: warm and dry, no rash. Neuro:  Strength and sensation are intact. Psych: Normal affect.  Accessory Clinical Findings    ECG personally reviewed by me today -regular sinus  rhythm, 64 - no acute changes.  Lab Results  Component Value Date   WBC 6.2 04/23/2020   HGB 15.3 04/23/2020   HCT 44.4 04/23/2020   MCV 91 04/23/2020   PLT 259 04/23/2020   Lab Results  Component Value Date   CREATININE 1.24 05/12/2020   BUN 26 (H) 05/12/2020   NA 134 (L) 05/12/2020   K 4.1 05/12/2020   CL 104 05/12/2020   CO2 21 (L) 05/12/2020   Lab Results  Component Value Date   ALT 24 04/23/2020   AST 32 04/23/2020   ALKPHOS 59 04/23/2020   BILITOT 0.6 04/23/2020   Lab Results  Component Value Date   CHOL 144 04/23/2020   HDL 53 04/23/2020   LDLCALC 76 04/23/2020   TRIG 75 04/23/2020   CHOLHDL 2.7 04/23/2020    Lab Results  Component Value Date   HGBA1C 5.4 04/06/2017    Assessment & Plan    1.  Coronary artery disease: Status post catheterization in January 2019 showing a chronic total occlusion of the right coronary artery and otherwise nonobstructive disease.  He has been medically managed and for the most part has done quite well.  He  gained about 13 pounds between December and now in the setting of stopping exercise.  He has recently resumed his exercise bike and for the most part, has tolerated this well, now riding 40 minutes 3-4 times per week.  He has noticed that his exercise tolerance has not quite returned to where it was prior to December but his plan is to get back there.  He has not experienced any chest pain.  I have encouraged him to continue regular exercise and in the setting of weight gain, to look for opportunities to restrict calories with a goal of at least 13 pound weight loss.  He remains on aspirin (every other day-in setting of bruising), Zetia, nitrate, losartan, metoprolol, and rosuvastatin therapy.  2.  Essential hypertension: Blood pressure elevated today at 140/80.  As above, he has gained 13 pounds since last winter.  We discussed potentially increasing his losartan to 100 mg daily however, he prefers to try and lose weight first.  He will follow his blood pressures at home regularly and contact us within the next month if systolics remain greater than 130 despite efforts to lose weight.  Otherwise, we will plan to see him back in 3 months.  3.  Hyperlipidemia: LDL of 76 in November.  At that time, rosuvastatin was increased to 10 mg daily.  He has been tolerating this.  He has not had follow-up lipids and LFTs since then.  We will check today, as he is fasting.  4.  History of tobacco abuse/COPD: He notes that he sometimes wheezes but this is managed with albuterol.  He was on Breztri but this was discontinued secondary to leg cramps.  He has not smoked in 6 years.  5.  Disposition: Patient will monitor blood pressures at home and continue to exercise and watch dietary intake with a goal of weight loss.  He will contact us if pressures remain elevated, otherwise, follow-up in 3 months.  Murray Hodgkins, NP 11/19/2020, 11:26 AM

## 2020-11-20 LAB — HEPATIC FUNCTION PANEL
ALT: 25 IU/L (ref 0–44)
AST: 33 IU/L (ref 0–40)
Albumin: 4.4 g/dL (ref 3.8–4.8)
Alkaline Phosphatase: 63 IU/L (ref 44–121)
Bilirubin Total: 0.8 mg/dL (ref 0.0–1.2)
Bilirubin, Direct: 0.18 mg/dL (ref 0.00–0.40)
Total Protein: 6.8 g/dL (ref 6.0–8.5)

## 2020-11-20 LAB — LIPID PANEL
Chol/HDL Ratio: 2.3 ratio (ref 0.0–5.0)
Cholesterol, Total: 123 mg/dL (ref 100–199)
HDL: 53 mg/dL (ref >39)
LDL Chol Calc (NIH): 54 mg/dL (ref 0–99)
Triglycerides: 78 mg/dL (ref 0–149)
VLDL Cholesterol Cal: 16 mg/dL (ref 5–40)

## 2020-11-26 ENCOUNTER — Other Ambulatory Visit: Payer: Self-pay | Admitting: Internal Medicine

## 2020-11-28 ENCOUNTER — Ambulatory Visit: Payer: Self-pay | Admitting: *Deleted

## 2020-11-28 ENCOUNTER — Telehealth: Payer: Self-pay | Admitting: *Deleted

## 2020-11-28 NOTE — Telephone Encounter (Signed)
See triage note- appointment kept- not sick

## 2020-11-28 NOTE — Telephone Encounter (Signed)
Copied from [place] [id number]. Topic: General - Call Back - No Documentation >> Nov 28, 2020 12:21 PM Erick Blinks wrote: Reason for CRM: Pt is experiencing chest congestion with a cough, pt declined appt offer since it is not until weeks away. No other symptoms reported  Best contact: [phone]

## 2020-11-28 NOTE — Telephone Encounter (Signed)
Spoke with pt transferred to NT to really  April's message. There was a cancellation for Monday. Per NT recommendation appt was scheduled. Nurse stated she would cancel appt if UC or telemed was appropriate.

## 2020-11-28 NOTE — Telephone Encounter (Signed)
LMOVM for pt to return call. Patient will need to seek UC or telemed, due no availability. Okay for PEC to advise patient.

## 2020-11-28 NOTE — Telephone Encounter (Signed)
Patient states he does not need to go to UC. Patient states he has cough at night and needs review of treatment- he had SE with inhaler.  Answer Assessment - Initial Assessment Questions 1. ONSET: "When did the cough begin?"      Cough only at night 2. SEVERITY: "How bad is the cough today?"      Only at night 3. SPUTUM: "Describe the color of your sputum" (none, dry cough; clear, white, yellow, green)     Chest congestion- COPD 4. HEMOPTYSIS: "Are you coughing up any blood?" If so ask: "How much?" (flecks, streaks, tablespoons, etc.)     no 5. DIFFICULTY BREATHING: "Are you having difficulty breathing?" If Yes, ask: "How bad is it?" (e.g., mild, moderate, severe)    - MILD: No SOB at rest, mild SOB with walking, speaks normally in sentences, can lie down, no retractions, pulse < 100.    - MODERATE: SOB at rest, SOB with minimal exertion and prefers to sit, cannot lie down flat, speaks in phrases, mild retractions, audible wheezing, pulse 100-120.    - SEVERE: Very SOB at rest, speaks in single words, struggling to breathe, sitting hunched forward, retractions, pulse > 120      No- just at night-COPD 6. FEVER: "Do you have a fever?" If Yes, ask: "What is your temperature, how was it measured, and when did it start?"     no 7. CARDIAC HISTORY: "Do you have any history of heart disease?" (e.g., heart attack, congestive heart failure)      Just checked at cardiologist 8. LUNG HISTORY: "Do you have any history of lung disease?"  (e.g., pulmonary embolus, asthma, emphysema)     COPD 9. PE RISK FACTORS: "Do you have a history of blood clots?" (or: recent major surgery, recent prolonged travel, bedridden)     no  Protocols used: Cough - Chronic-A-AH

## 2020-11-28 NOTE — Telephone Encounter (Signed)
Patient is calling to schedule appointment with provider for COPD medication follow up- patient states he has tested negative for COVID with recent travel, he is exercising daily and has had recent follow up with cardiologist. Patient wants to discuss adding a possible medication at night since he had to stop the [facility] due to leg cramps at night. Patient reports he has increased chest congestion and cough at night- he states the Eva worked great for that- but the SE were to severe for him to continue- wants to see if there is alternative. Appointment scheduled for Monday

## 2020-12-01 ENCOUNTER — Ambulatory Visit: Payer: BC Managed Care – PPO | Admitting: Family Medicine

## 2020-12-01 ENCOUNTER — Encounter: Payer: Self-pay | Admitting: Family Medicine

## 2020-12-01 ENCOUNTER — Other Ambulatory Visit: Payer: Self-pay

## 2020-12-01 VITALS — BP 119/67 | HR 82 | Temp 97.5°F | Resp 16 | Ht 69.0 in | Wt 205.0 lb

## 2020-12-01 DIAGNOSIS — R059 Cough, unspecified: Secondary | ICD-10-CM

## 2020-12-01 DIAGNOSIS — K219 Gastro-esophageal reflux disease without esophagitis: Secondary | ICD-10-CM

## 2020-12-01 DIAGNOSIS — I10 Essential (primary) hypertension: Secondary | ICD-10-CM | POA: Diagnosis not present

## 2020-12-01 DIAGNOSIS — J441 Chronic obstructive pulmonary disease with (acute) exacerbation: Secondary | ICD-10-CM | POA: Diagnosis not present

## 2020-12-01 DIAGNOSIS — I2510 Atherosclerotic heart disease of native coronary artery without angina pectoris: Secondary | ICD-10-CM

## 2020-12-01 DIAGNOSIS — F17201 Nicotine dependence, unspecified, in remission: Secondary | ICD-10-CM

## 2020-12-01 MED ORDER — OMEPRAZOLE 20 MG PO CPDR: 20.0000 mg | DELAYED_RELEASE_CAPSULE | Freq: Every day | ORAL | 3 refills | Status: DC

## 2020-12-01 NOTE — Patient Instructions (Signed)
Add mid day Albuterol MDI

## 2020-12-01 NOTE — Progress Notes (Signed)
Established patient visit   Patient: Christopher Villegas   DOB: 1956/03/10   65 y.o. Male  MRN: [id number] Visit Date: 12/01/2020  Today's healthcare provider: Wilhemena Durie, MD   Chief Complaint  Patient presents with   Cough   Subjective    Cough This is a recurrent problem. The current episode started in the past 7 days. The problem has been gradually worsening. The cough is Non-productive. Associated symptoms include wheezing. Pertinent negatives include no chills, fever, headaches, nasal congestion, postnasal drip or shortness of breath. Nothing aggravates the symptoms. He has tried OTC cough suppressant for the symptoms. The treatment provided mild relief.   Cough is mainly a hacking cough with occasional production of sputum that is clear.  Certainly no hemoptysis and no dyspnea We tried [place]  which might have helped but he had significant leg cramps  which resolved when he stopped it Is not having any significant allergy symptoms or reflux symptoms.      Medications: Outpatient Medications Prior to Visit  Medication Sig   albuterol (VENTOLIN HFA) 108 (90 Base) MCG/ACT inhaler INHALE 1 PUFF INTO THE LUNGS EVERY MORNING   aspirin EC 81 MG tablet Take 81 mg by mouth every other day. Swallow whole.   ezetimibe (ZETIA) 10 MG tablet TAKE 1 TABLET BY MOUTH DAILY   isosorbide mononitrate (IMDUR) 30 MG 24 hr tablet Take 1 tablet (30 mg total) by mouth daily.   losartan (COZAAR) 50 MG tablet TAKE 1 TABLET BY MOUTH DAILY   metoprolol tartrate (LOPRESSOR) 25 MG tablet TAKE ONE TABLET TWICE DAILY   rosuvastatin (CRESTOR) 10 MG tablet Take 1 tablet (10 mg total) by mouth daily.   No facility-administered medications prior to visit.    Review of Systems  Constitutional:  Negative for chills and fever.  HENT:  Negative for postnasal drip.   Respiratory:  Positive for cough and wheezing. Negative for shortness of breath.   Neurological:  Negative for headaches.        Objective    BP 119/67   Pulse 82   Temp (!) 97.5 F (36.4 C)   Resp 16   Ht 5\' 9"  (1.753 m)   Wt 205 lb (93 kg)   BMI 30.27 kg/m  BP Readings from Last 3 Encounters:  12/01/20 119/67  11/19/20 140/80  05/29/20 128/67   Wt Readings from Last 3 Encounters:  12/01/20 205 lb (93 kg)  11/19/20 208 lb 6 oz (94.5 kg)  05/29/20 204 lb (92.5 kg)       Physical Exam Vitals reviewed.  Constitutional:      Appearance: Normal appearance. He is normal weight.  HENT:     Head: Normocephalic and atraumatic.     Nose: Nose normal.     Mouth/Throat:     Mouth: Mucous membranes are moist.     Pharynx: Oropharynx is clear.  Eyes:     Extraocular Movements: Extraocular movements intact.     Conjunctiva/sclera: Conjunctivae normal.     Pupils: Pupils are equal, round, and reactive to light.  Cardiovascular:     Rate and Rhythm: Normal rate and regular rhythm.  Pulmonary:     Effort: Pulmonary effort is normal.     Breath sounds: Normal breath sounds. No wheezing or rhonchi.     Comments: Scattered rhonchi and scattered wheezing. Abdominal:     General: Abdomen is flat. Bowel sounds are normal.     Palpations: Abdomen is soft.  Musculoskeletal:  Cervical back: Normal range of motion.  Skin:    General: Skin is warm and dry.  Neurological:     General: No focal deficit present.     Mental Status: He is alert and oriented to person, place, and time. Mental status is at baseline.  Psychiatric:        Mood and Affect: Mood normal.        Behavior: Behavior normal.        Thought Content: Thought content normal.        Judgment: Judgment normal.      No results found for any visits on 12/01/20.  Assessment & Plan     1. COPD with acute exacerbation ([place]) Presently patient is only using a dose of albuterol in the morning.  We will add a midday dose and see how he does with this.  I am going to refer to pulmonary EXTR everything is covered.  2. Cough I think this is  just some COPD/lung disease quit smoking years ago.  He is not on an ACE inhibitor.  This time reflux could be contributing to this so will add omeprazole 20 mg every morning - Ambulatory referral to Pulmonology - omeprazole (PRILOSEC) 20 MG capsule; Take 1 capsule (20 mg total) by mouth daily.  Dispense: 30 capsule; Refill: 3  3. ASCVD (arteriosclerotic cardiovascular disease) All risk factors treated.  And is on rosuvastatin  4. Essential hypertension Controlled on losartan and metoprolol  5. Tobacco abuse, in remission Patient quit years ago.  6. Gastroesophageal reflux disease without esophagitis Add omeprazole   No follow-ups on file.      I, Wilhemena Durie, MD, have reviewed all documentation for this visit. The documentation on 12/06/20 for the exam, diagnosis, procedures, and orders are all accurate and complete.    Omarrion Carmer Cranford Mon, MD  [facility] [phone] (phone) [phone] (fax)  [place]

## 2020-12-02 ENCOUNTER — Other Ambulatory Visit: Payer: Self-pay | Admitting: Family Medicine

## 2020-12-02 NOTE — Telephone Encounter (Signed)
Future visit in 6 months

## 2020-12-08 ENCOUNTER — Encounter: Payer: Self-pay | Admitting: Pulmonary Disease

## 2020-12-08 ENCOUNTER — Ambulatory Visit: Payer: BC Managed Care – PPO | Admitting: Pulmonary Disease

## 2020-12-08 ENCOUNTER — Other Ambulatory Visit: Payer: Self-pay

## 2020-12-08 VITALS — BP 132/78 | HR 60 | Temp 97.9°F | Ht 69.0 in | Wt 201.8 lb

## 2020-12-08 DIAGNOSIS — R053 Chronic cough: Secondary | ICD-10-CM

## 2020-12-08 MED ORDER — DULERA 100-5 MCG/ACT IN AERO: 2.0000 | INHALATION_SPRAY | Freq: Two times a day (BID) | RESPIRATORY_TRACT | 5 refills | Status: DC

## 2020-12-08 NOTE — Patient Instructions (Signed)
Dulera two puffs in the morning and two puffs in the evening, and rinse your mouth after each use  Will arrange for pulmonary function test  Albuterol two puffs every 6 hours as needed for cough, wheeze, or chest congestion  Follow up in 6 to 8 weeks

## 2020-12-08 NOTE — Progress Notes (Signed)
[facility]  Chief Complaint  Patient presents with   Consult    C/o sob and dry cough, occass. Light green in am x 3 mths.Had this similar last Jan. 2021.    Constitutional:  BP 132/78 (BP Location: Right Arm, Cuff Size: Normal)   Pulse 60   Temp 97.9 F (36.6 C) (Temporal)   Ht 5\' 9"  (1.753 m)   Wt 201 lb 12.8 oz (91.5 kg)   SpO2 96%   BMI 29.80 kg/m   Past Medical History:  CAD, GERD, HTN, HLD  Past Surgical History:  He  has a past surgical history that includes Tonsillectomy and adenoidectomy; Cyst excision perineal; Carotid endarterectomy (Right, 2009); Colonoscopy with propofol (N/A, 06/16/2017); Esophagogastroduodenoscopy (egd) with propofol (N/A, 06/16/2017); polypectomy (06/16/2017); and LEFT HEART CATH AND CORONARY ANGIOGRAPHY (N/A, 07/01/2017).  Brief Summary:  Christopher Villegas is a 65 y.o. male former smoker with chronic cough and wheeze.      Subjective:   This started in January 2021.  His cough and congestion continued through until March 2021.  He was seen by his PCP and treated with prednisone and breztri.  Felt better and transitioned off medicine.  Symptoms recurred and he started breztri again.  Helped but go let cramps.  He stopped breztri and symptoms recurred.  Started breztri, but this time leg cramps were unbearable.  He has still been using albuterol which helps.  He has been getting more short of breath with exercise.  Has occasional runny nose.  Was seen by cardiology recently.  He is from [place].  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  Has pet dog.  Smoked up to 2 ppds for about 45 yrs.  No history of pneumonia or TB.  No family history of lung disease.    Physical Exam:   Appearance - well kempt   ENMT - no sinus tenderness, no oral exudate, no LAN, Mallampati 3 airway, no stridor  Respiratory - equal breath sounds bilaterally, faint b/l wheeze and rhonchi that  clear with cough  CV - s1s2 regular rate and rhythm, no murmurs  Ext - no clubbing, no edema  Skin - no rashes  Psych - normal mood and affect   Pulmonary testing:    Chest Imaging:  LDCT chest 04/29/20 >> small nodules up to 3.7 mm, mild bronchial wall thickening, mild centrilobular and paraseptal emphysema  Social History:  He  reports that he quit smoking about 6 years ago. His smoking use included cigarettes. He has a 67.50 pack-year smoking history. He has never used smokeless tobacco. He reports that he does not drink alcohol and does not use drugs.  Family History:  His family history includes Arthritis in his mother; Autoimmune disease in his sister; Cancer in his mother; Healthy in his son and son; Heart disease in his father; Hyperlipidemia in his mother; Hypertension in his father and mother; Kidney failure in his father; Lupus in his sister.     Assessment/Plan:   Chronic cough and wheeze with progressive dyspnea. - likely has asthma - has extensive history of smoking and prior CT chest shows mild changes of emphysema; could have component of COPD - had clinical benefit from previous inhaler therapy but developed leg cramps; suspect this was related to LAMA component in [place] - will resume inhaler therapy with dulera - continue prn albuterol - will arrange for pulmonary function test - might need further allergy  therapy if symptoms persist  History of tobacco abuse. - he will get follow up LDCT chest in November 2022  Exercised induced asthma. - discussed doing high intensity warm up and using albuterol before exercise as needed, but should improve with dulera therapy  Time Spent Involved in Patient Care on Day of Examination:  47 minutes  Follow up:   Patient Instructions  Dulera two puffs in the morning and two puffs in the evening, and rinse your mouth after each use  Will arrange for pulmonary function test  Albuterol two puffs every 6 hours as  needed for cough, wheeze, or chest congestion  Follow up in 6 to 8 weeks  Medication List:   Allergies as of 12/08/2020       Reactions   Plavix [clopidogrel] Rash        Medication List        Accurate as of December 08, 2020 10:01 AM. If you have any questions, ask your nurse or doctor.          albuterol 108 (90 Base) MCG/ACT inhaler Commonly known as: VENTOLIN HFA INHALE 1 PUFF INTO THE LUNGS EVERY MORNING   aspirin EC 81 MG tablet Take 81 mg by mouth every other day. Swallow whole.   Dulera 100-5 MCG/ACT Aero Generic drug: mometasone-formoterol Inhale 2 puffs into the lungs 2 (two) times daily. Started by: Chesley Mires, MD   ezetimibe 10 MG tablet Commonly known as: ZETIA TAKE 1 TABLET BY MOUTH DAILY   isosorbide mononitrate 30 MG 24 hr tablet Commonly known as: IMDUR Take 1 tablet (30 mg total) by mouth daily.   losartan 50 MG tablet Commonly known as: COZAAR TAKE 1 TABLET BY MOUTH DAILY   metoprolol tartrate 25 MG tablet Commonly known as: LOPRESSOR TAKE ONE TABLET TWICE DAILY   omeprazole 20 MG capsule Commonly known as: PRILOSEC Take 1 capsule (20 mg total) by mouth daily.   rosuvastatin 10 MG tablet Commonly known as: CRESTOR Take 1 tablet (10 mg total) by mouth daily.        Signature:  Chesley Mires, MD [place] Pager - [phone] 12/08/2020, 10:01 AM

## 2020-12-10 NOTE — Telephone Encounter (Signed)
Dr. Sood, please see mychart message sent by pt and advise. °

## 2021-02-09 ENCOUNTER — Ambulatory Visit (INDEPENDENT_AMBULATORY_CARE_PROVIDER_SITE_OTHER): Payer: BC Managed Care – PPO | Admitting: Pulmonary Disease

## 2021-02-09 ENCOUNTER — Ambulatory Visit: Payer: BC Managed Care – PPO | Admitting: Pulmonary Disease

## 2021-02-09 ENCOUNTER — Encounter: Payer: Self-pay | Admitting: Pulmonary Disease

## 2021-02-09 ENCOUNTER — Other Ambulatory Visit: Payer: Self-pay

## 2021-02-09 VITALS — BP 118/72 | HR 63 | Temp 98.6°F | Ht 68.0 in | Wt 205.4 lb

## 2021-02-09 DIAGNOSIS — J4540 Moderate persistent asthma, uncomplicated: Secondary | ICD-10-CM | POA: Diagnosis not present

## 2021-02-09 DIAGNOSIS — R053 Chronic cough: Secondary | ICD-10-CM

## 2021-02-09 MED ORDER — MONTELUKAST SODIUM 10 MG PO TABS: 10.0000 mg | ORAL_TABLET | Freq: Every day | ORAL | 11 refills | Status: DC

## 2021-02-09 MED ORDER — ARNUITY ELLIPTA 100 MCG/ACT IN AEPB: 1.0000 | INHALATION_SPRAY | Freq: Every day | RESPIRATORY_TRACT | 5 refills | Status: DC

## 2021-02-09 NOTE — Progress Notes (Signed)
PFT done today. 

## 2021-02-09 NOTE — Progress Notes (Signed)
[facility]  Chief Complaint  Patient presents with   Follow-up    Pt states want to take about dulera not benefiting him.     Constitutional:  BP 118/72 (BP Location: Left Arm, Patient Position: Sitting, Cuff Size: Large)   Pulse 63   Temp 98.6 F (37 C) (Oral)   Ht '5\' 8"'$  (1.727 m)   Wt 205 lb 6.4 oz (93.2 kg)   SpO2 99%   BMI 31.23 kg/m   Past Medical History:  CAD, GERD, HTN, HLD  Past Surgical History:  He  has a past surgical history that includes Tonsillectomy and adenoidectomy; Cyst excision perineal; Carotid endarterectomy (Right, 2009); Colonoscopy with propofol (N/A, 06/16/2017); Esophagogastroduodenoscopy (egd) with propofol (N/A, 06/16/2017); polypectomy (06/16/2017); and LEFT HEART CATH AND CORONARY ANGIOGRAPHY (N/A, 07/01/2017).  Brief Summary:  Christopher Villegas is a 65 y.o. male former smoker with chronic cough and wheeze.      Subjective:   He had PFT today.  Showed significant bronchodilator response and air trapping consistent with asthma.  He still had trouble with leg cramps after switching to dulera.  Seemed tolerable with dulera one puff daily, but then his breathing got worse.  He uses albuterol and this helps.  Physical Exam:   Appearance - well kempt   ENMT - no sinus tenderness, no oral exudate, no LAN, Mallampati 3 airway, no stridor  Respiratory - equal breath sounds bilaterally, no wheezing or rales  CV - s1s2 regular rate and rhythm, no murmurs  Ext - no clubbing, no edema  Skin - no rashes  Psych - normal mood and affect    Pulmonary testing:  PFT 02/09/21 >> FEV1 3.22 (102%), FEV1% 74, TLC 7.77 (117%), RV 3.60 (159%), DLCO 89%, +BD  Chest Imaging:  LDCT chest 04/29/20 >> small nodules up to 3.7 mm, mild bronchial wall thickening, mild centrilobular and paraseptal emphysema  Social History:  He  reports that he quit smoking about 6 years ago. His smoking use included cigarettes. He  has a 67.50 pack-year smoking history. He has never used smokeless tobacco. He reports that he does not drink alcohol and does not use drugs.  Family History:  His family history includes Arthritis in his mother; Autoimmune disease in his sister; Cancer in his mother; Healthy in his son and son; Heart disease in his father; Hyperlipidemia in his mother; Hypertension in his father and mother; Kidney failure in his father; Lupus in his sister.     Assessment/Plan:   Moderate, persistent asthma. - even though he has history of tobacco abuse and mild changes of emphysema on CT imaging, PFT doesn't show airflow obstruction at this time - he has leg cramps from breztri and dulera; suspect he is not able to tolerate LABA and/or LAMA - will change to arnuity 100 one puff daily and montelukast 10 mg nightly - prn albuterol - might need further testing if symptoms persist to determine if he is a candidate for biologic agent  History of tobacco abuse. - he will get follow up LDCT chest in November 2022  Exercised induced asthma. - discussed doing high intensity warm up and using albuterol before exercise as needed, but should improve with dulera therapy  Time Spent Involved in Patient Care on Day of Examination:  32 minutes  Follow up:   Patient Instructions  Stop dulera  Start arnuity one puff daily, and rinse your mouth after each use  Start montelukast 10 mg nightly  Albuterol two puffs every 6 hours as needed for cough, wheeze, or chest congestion  Follow up in 4 months  Medication List:   Allergies as of 02/09/2021       Reactions   Plavix [clopidogrel] Rash        Medication List        Accurate as of February 09, 2021 11:07 AM. If you have any questions, ask your nurse or doctor.          STOP taking these medications    Dulera 100-5 MCG/ACT Aero Generic drug: mometasone-formoterol Stopped by: Chesley Mires, MD       TAKE these medications    albuterol 108  (90 Base) MCG/ACT inhaler Commonly known as: VENTOLIN HFA INHALE 1 PUFF INTO THE LUNGS EVERY MORNING   Arnuity Ellipta 100 MCG/ACT Aepb Generic drug: Fluticasone Furoate Inhale 1 puff into the lungs daily. Started by: Chesley Mires, MD   aspirin EC 81 MG tablet Take 81 mg by mouth every other day. Swallow whole.   ezetimibe 10 MG tablet Commonly known as: ZETIA TAKE 1 TABLET BY MOUTH DAILY   isosorbide mononitrate 30 MG 24 hr tablet Commonly known as: IMDUR Take 1 tablet (30 mg total) by mouth daily.   losartan 50 MG tablet Commonly known as: COZAAR TAKE 1 TABLET BY MOUTH DAILY   metoprolol tartrate 25 MG tablet Commonly known as: LOPRESSOR TAKE ONE TABLET TWICE DAILY   montelukast 10 MG tablet Commonly known as: SINGULAIR Take 1 tablet (10 mg total) by mouth at bedtime. Started by: Chesley Mires, MD   omeprazole 20 MG capsule Commonly known as: PRILOSEC Take 1 capsule (20 mg total) by mouth daily.   rosuvastatin 10 MG tablet Commonly known as: CRESTOR Take 1 tablet (10 mg total) by mouth daily.        Signature:  Chesley Mires, MD [place] Pager - [phone] 02/09/2021, 11:08 AM

## 2021-02-09 NOTE — Patient Instructions (Signed)
Stop [employer] arnuity one puff daily, and rinse your mouth after each use  Start montelukast 10 mg nightly  Albuterol two puffs every 6 hours as needed for cough, wheeze, or chest congestion  Follow up in 4 months

## 2021-02-10 LAB — PULMONARY FUNCTION TEST
DL/VA % pred: 77 %
DL/VA: 3.2 ml/min/mmHg/L
DLCO cor % pred: 89 %
DLCO cor: 22.41 ml/min/mmHg
DLCO unc % pred: 89 %
DLCO unc: 22.41 ml/min/mmHg
FEF 25-75 Post: 3.21 L/sec
FEF 25-75 Pre: 1.56 L/sec
FEF2575-%Change-Post: 106 %
FEF2575-%Pred-Post: 128 %
FEF2575-%Pred-Pre: 62 %
FEV1-%Change-Post: 21 %
FEV1-%Pred-Post: 102 %
FEV1-%Pred-Pre: 83 %
FEV1-Post: 3.22 L
FEV1-Pre: 2.64 L
FEV1FVC-%Change-Post: 8 %
FEV1FVC-%Pred-Pre: 92 %
FEV6-%Change-Post: 13 %
FEV6-%Pred-Post: 107 %
FEV6-%Pred-Pre: 95 %
FEV6-Post: 4.31 L
FEV6-Pre: 3.81 L
FEV6FVC-%Change-Post: 0 %
FEV6FVC-%Pred-Post: 105 %
FEV6FVC-%Pred-Pre: 104 %
FVC-%Change-Post: 12 %
FVC-%Pred-Post: 102 %
FVC-%Pred-Pre: 90 %
FVC-Post: 4.33 L
FVC-Pre: 3.84 L
Post FEV1/FVC ratio: 74 %
Post FEV6/FVC ratio: 99 %
Pre FEV1/FVC ratio: 69 %
Pre FEV6/FVC Ratio: 99 %
RV % pred: 159 %
RV: 3.6 L
TLC % pred: 117 %
TLC: 7.77 L

## 2021-02-26 ENCOUNTER — Other Ambulatory Visit: Payer: Self-pay | Admitting: Internal Medicine

## 2021-03-02 ENCOUNTER — Other Ambulatory Visit: Payer: Self-pay | Admitting: Family Medicine

## 2021-03-02 DIAGNOSIS — I10 Essential (primary) hypertension: Secondary | ICD-10-CM

## 2021-03-05 ENCOUNTER — Ambulatory Visit: Payer: BC Managed Care – PPO | Admitting: Nurse Practitioner

## 2021-03-13 ENCOUNTER — Other Ambulatory Visit: Payer: Self-pay

## 2021-03-13 ENCOUNTER — Encounter: Payer: Self-pay | Admitting: Nurse Practitioner

## 2021-03-13 ENCOUNTER — Ambulatory Visit: Payer: BC Managed Care – PPO | Admitting: Nurse Practitioner

## 2021-03-13 VITALS — BP 110/64 | HR 63 | Ht 69.0 in | Wt 209.0 lb

## 2021-03-13 DIAGNOSIS — I2510 Atherosclerotic heart disease of native coronary artery without angina pectoris: Secondary | ICD-10-CM | POA: Diagnosis not present

## 2021-03-13 DIAGNOSIS — I10 Essential (primary) hypertension: Secondary | ICD-10-CM | POA: Diagnosis not present

## 2021-03-13 DIAGNOSIS — E785 Hyperlipidemia, unspecified: Secondary | ICD-10-CM | POA: Diagnosis not present

## 2021-03-13 NOTE — Progress Notes (Signed)
Office Visit    Patient Name: Christopher Villegas Date of Encounter: 03/13/2021  Primary Care Provider:  Jerrol Banana., MD Primary Cardiologist:  Nelva Bush, MD  Chief Complaint    65 year old male with a history of CAD, hypertension, hyperlipidemia, carotid arterial disease, and GERD, who presents for follow-up of CAD.  Past Medical History    Past Medical History:  Diagnosis Date   CAD (coronary artery disease)    a. 06/2017 Cath: LM 30ost/d, LAD 48m, D1/2 large, nl, D3 small, LCX 20ost, 40p, OM1/2/3 nl, RCA 40/146m - CTO w/ L->R collats. RPDA fills via collats from 1st Septal. EF 55-65%-->Med rx (could consider CTO PCI for angina).   Carotid arterial disease ([place])    a. 2009 s/p R CEA; b. 05/2018 U/S: RICA 2-29%, RECA >79%, LICA 8-92%, LECA <11%.   GERD (gastroesophageal reflux disease)    History of echocardiogram    a. 10/2015 Echo: EF 55%. Mild TR/MR.   Hyperlipidemia    Hypertension    Past Surgical History:  Procedure Laterality Date   CAROTID ENDARTERECTOMY Right 2009   COLONOSCOPY WITH PROPOFOL N/A 06/16/2017   Procedure: COLONOSCOPY WITH PROPOFOL;  Surgeon: Lucilla Lame, MD;  Location: [facility];  Service: Endoscopy;  Laterality: N/A;   CYST EXCISION PERINEAL     ESOPHAGOGASTRODUODENOSCOPY (EGD) WITH PROPOFOL N/A 06/16/2017   Procedure: ESOPHAGOGASTRODUODENOSCOPY (EGD) WITH PROPOFOL;  Surgeon: Lucilla Lame, MD;  Location: [facility];  Service: Endoscopy;  Laterality: N/A;   LEFT HEART CATH AND CORONARY ANGIOGRAPHY N/A 07/01/2017   Procedure: LEFT HEART CATH AND CORONARY ANGIOGRAPHY;  Surgeon: Nelva Bush, MD;  Location: [facility] [place] [facility];  Service: Cardiovascular;  Laterality: N/A;   POLYPECTOMY  06/16/2017   Procedure: POLYPECTOMY;  Surgeon: Lucilla Lame, MD;  Location: [facility];  Service: Endoscopy;;   TONSILLECTOMY AND ADENOIDECTOMY      Allergies  Allergies  Allergen Reactions   Plavix [Clopidogrel] Rash     History of Present Illness    65 year old male with the above past medical history including CAD, hypertension, hyperlipidemia, carotid arterial disease, and GERD.  He is status post right carotid endarterectomy in 2009 with 1 to 39% bilateral internal carotid artery stenoses on most recent ultrasound in 2019.  He previoulys underwent diagnostic catheterization in January 2009 in the setting of angina was found to have a chronic total occlusion of the mid right coronary artery with left-to-right and right to right collaterals.  He otherwise had diffuse, nonobstructive disease and medical therapy was recommended.  He has been managed with beta-blocker and nitrate therapy.  He was last seen in cardiology clinic in June of this year, at which time he had recently started exercising again and noted some activity intolerance in the setting of deconditioning and weight gain.  He was not having any chest pain.  His blood pressure was elevated and he was to follow blood pressures at home.  I note that blood pressures at follow-up provider visits in June and August were normal.  Labs that day showed an LDL at goal of 54 with normal LFTs.  Since his last visit, he has followed up with pulmonology in the setting of a long smoking history.  He was diagnosed with asthma and has been taking Singulair and Arnuity Ellipta.  He thinks that these may have resulted in improved activity tolerance and less dyspnea.  He is now riding his stationary bike for about 40 minutes 3-4 times per week and  is not having any significant dyspnea while riding.  He still occasionally notes that simple acts like walking up stairs or carrying things will cause his heart rate to rise into the 90s or low 100s, but he is asymptomatic when this occurs.  We discussed today, that this would be a normal response to increased activity.  His blood pressure has been stable at home, typically in the 110s to 120s.  He notes significant reassurance  following his pulmonology evaluation and asthma diagnosis.  He denies palpitations, PND, orthopnea, dizziness, syncope, edema, or early satiety.  Home Medications    Current Outpatient Medications  Medication Sig Dispense Refill   albuterol (VENTOLIN HFA) 108 (90 Base) MCG/ACT inhaler INHALE 1 PUFF INTO THE LUNGS EVERY MORNING 8.5 g 3   aspirin EC 81 MG tablet Take 81 mg by mouth every other day. Swallow whole.     ezetimibe (ZETIA) 10 MG tablet TAKE 1 TABLET BY MOUTH DAILY 90 tablet 1   Fluticasone Furoate (ARNUITY ELLIPTA) 100 MCG/ACT AEPB Inhale 1 puff into the lungs daily. 30 each 5   isosorbide mononitrate (IMDUR) 30 MG 24 hr tablet Take 1 tablet (30 mg total) by mouth daily. 30 tablet 6   losartan (COZAAR) 50 MG tablet TAKE 1 TABLET BY MOUTH DAILY 30 tablet 11   metoprolol tartrate (LOPRESSOR) 25 MG tablet TAKE ONE TABLET TWICE DAILY 180 tablet 0   montelukast (SINGULAIR) 10 MG tablet Take 1 tablet (10 mg total) by mouth at bedtime. 30 tablet 11   omeprazole (PRILOSEC) 20 MG capsule Take 1 capsule (20 mg total) by mouth daily. 30 capsule 3   rosuvastatin (CRESTOR) 10 MG tablet Take 1 tablet (10 mg total) by mouth daily. 90 tablet 3   No current facility-administered medications for this visit.     Review of Systems    Doing well.  He denies chest pain, palpitations, dyspnea, pnd, orthopnea, n, v, dizziness, syncope, edema, weight gain, or early satiety.  All other systems reviewed and are otherwise negative except as noted above.  Physical Exam    VS:  BP 110/64   Pulse 63   Ht 5\' 9"  (1.753 m)   Wt 209 lb (94.8 kg)   SpO2 97%   BMI 30.86 kg/m  , BMI Body mass index is 30.86 kg/m.     GEN: Well nourished, well developed, in no acute distress. HEENT: normal. Neck: Supple, no JVD, carotid bruits, or masses. Cardiac: RRR, no murmurs, rubs, or gallops. No clubbing, cyanosis, edema.  Radials/PT 2+ and equal bilaterally.  Respiratory:  Respirations regular and unlabored, clear  to auscultation bilaterally. GI: Soft, nontender, nondistended, BS + x 4. MS: no deformity or atrophy. Skin: warm and dry, no rash. Neuro:  Strength and sensation are intact. Psych: Normal affect.  Accessory Clinical Findings    ECG personally reviewed by me today -regular sinus rhythm, 63-no acute changes.  Lab Results  Component Value Date   WBC 6.2 04/23/2020   HGB 15.3 04/23/2020   HCT 44.4 04/23/2020   MCV 91 04/23/2020   PLT 259 04/23/2020   Lab Results  Component Value Date   CREATININE 1.24 05/12/2020   BUN 26 (H) 05/12/2020   NA 134 (L) 05/12/2020   K 4.1 05/12/2020   CL 104 05/12/2020   CO2 21 (L) 05/12/2020   Lab Results  Component Value Date   ALT 25 11/19/2020   AST 33 11/19/2020   ALKPHOS 63 11/19/2020   BILITOT 0.8 11/19/2020  Lab Results  Component Value Date   CHOL 123 11/19/2020   HDL 53 11/19/2020   LDLCALC 54 11/19/2020   TRIG 78 11/19/2020   CHOLHDL 2.3 11/19/2020    Lab Results  Component Value Date   HGBA1C 5.4 04/06/2017    Assessment & Plan    1.  Coronary artery disease: Status post catheterization in January 2019 showing a chronic total occlusion of the right coronary artery and otherwise nonobstructive disease.  He has been medically managed.  He is now exercising regularly, riding his exercise bike with some amount of resistance for 30 to 40 minutes 3-4 times per week.  He has noted improvement in activity tolerance over the past 3 months and does not experience any chest pain or dyspnea when riding his bike.  He sometimes notes elevations in heart rates with simple activity such as climbing stairs or carrying something.  Rates might rise into the 90s or low 100s.  He is not symptomatic when this occurs but questions whether or not it is a sign of progressing heart disease.  I offered reassurance given his excellent exercise tolerance and lack of chest pain or dyspnea.  No ischemic testing is warranted at this time.  Continue aspirin,  Zetia, nitrate, losartan, metoprolol, and rosuvastatin.  2.  Essential hypertension: Blood pressure is normal today and has been trending in a similar fashion at home.  Continue current regimen.  3.  Hyperlipidemia: Fasting lipids in June with a total cholesterol of 123 and LDL of 54.  LFTs were normal.  Continue statin therapy.  4.  History of tobacco abuse/asthma: Followed by pulmonology and doing well on Singulair and Arnuity Ellipta.  He has not required any use of his rescue inhaler.  5.  Disposition: Follow-up in clinic in 6 months or sooner if necessary.  Murray Hodgkins, NP 03/13/2021, 10:50 AM

## 2021-03-13 NOTE — Patient Instructions (Signed)
Medication Instructions:  No changes at this time.   *If you need a refill on your cardiac medications before your next appointment, please call your pharmacy*   Lab Work: None  If you have labs (blood work) drawn today and your tests are completely normal, you will receive your results only by: [place] (if you have MyChart) OR A paper copy in the mail If you have any lab test that is abnormal or we need to change your treatment, we will call you to review the results.   Testing/Procedures: None    Follow-Up: At [facility], you and your health needs are our priority.  As part of our continuing mission to provide you with exceptional heart care, we have created designated Provider Care Teams.  These Care Teams include your primary Cardiologist (physician) and Advanced Practice Providers (APPs -  Physician Assistants and Nurse Practitioners) who all work together to provide you with the care you need, when you need it.  Your next appointment:   6 month(s)  The format for your next appointment:   In Person  Provider:   Nelva Bush, MD

## 2021-03-24 DIAGNOSIS — L821 Other seborrheic keratosis: Secondary | ICD-10-CM | POA: Diagnosis not present

## 2021-03-24 DIAGNOSIS — D229 Melanocytic nevi, unspecified: Secondary | ICD-10-CM | POA: Diagnosis not present

## 2021-03-24 DIAGNOSIS — Z85828 Personal history of other malignant neoplasm of skin: Secondary | ICD-10-CM | POA: Diagnosis not present

## 2021-03-24 DIAGNOSIS — L570 Actinic keratosis: Secondary | ICD-10-CM | POA: Diagnosis not present

## 2021-03-24 DIAGNOSIS — L814 Other melanin hyperpigmentation: Secondary | ICD-10-CM | POA: Diagnosis not present

## 2021-04-15 DIAGNOSIS — H2513 Age-related nuclear cataract, bilateral: Secondary | ICD-10-CM | POA: Diagnosis not present

## 2021-04-24 ENCOUNTER — Other Ambulatory Visit: Payer: Self-pay

## 2021-04-24 DIAGNOSIS — Z87891 Personal history of nicotine dependence: Secondary | ICD-10-CM

## 2021-04-27 ENCOUNTER — Telehealth: Payer: Self-pay | Admitting: Pulmonary Disease

## 2021-04-29 ENCOUNTER — Ambulatory Visit: Payer: BC Managed Care – PPO | Admitting: Internal Medicine

## 2021-04-29 NOTE — Telephone Encounter (Signed)
I spoke with [name] and his LCS CT has been scheduled on 05/05/21 @ 10:30am at [facility]

## 2021-05-05 ENCOUNTER — Other Ambulatory Visit: Payer: Self-pay

## 2021-05-05 ENCOUNTER — Ambulatory Visit
Admission: RE | Admit: 2021-05-05 | Discharge: 2021-05-05 | Disposition: A | Discharge status: Home / Self Care | Payer: BC Managed Care – PPO | Source: Ambulatory Visit | Attending: Acute Care | Admitting: Acute Care

## 2021-05-05 DIAGNOSIS — Z87891 Personal history of nicotine dependence: Secondary | ICD-10-CM | POA: Diagnosis not present

## 2021-05-12 ENCOUNTER — Other Ambulatory Visit: Payer: Self-pay | Admitting: Acute Care

## 2021-05-12 DIAGNOSIS — Z87891 Personal history of nicotine dependence: Secondary | ICD-10-CM

## 2021-05-13 ENCOUNTER — Telehealth: Payer: Self-pay

## 2021-05-13 NOTE — Telephone Encounter (Signed)
Copied from [place] [phone]. Topic: Appointment Scheduling - Scheduling Inquiry for Clinic >> May 13, 2021  3:00 PM Pawlus, Brayton Layman A wrote: Reason for CRM: Pt wanted a call back to go over his CPE, pt has appt but Dr Rosanna Randy will be out of the office, pt needs appt before the end of the year for his physical, Pt was also asking some questions about his son and if there was anyway he could also be seen at [facility]. Sons name is Eathen Budreau.

## 2021-05-14 NOTE — Progress Notes (Signed)
Please call patient / send result letter and let them  know their  low dose Ct was read as a Lung RADS 2: nodules that are benign in appearance and behavior with a very low likelihood of becoming a clinically active cancer due to size or lack of growth. Recommendation per radiology is for a repeat LDCT in 12 months. .Please let them  know we will order and schedule their  annual screening scan for 04/2022. Please let them  know there was notation of CAD on their  scan.  Please remind the patient  that this is a non-gated exam therefore degree or severity of disease  cannot be determined. Please have them  follow up with their PCP regarding potential risk factor modification, dietary therapy or pharmacologic therapy if clinically indicated. Pt.  is  currently on statin therapy. Please place order for annual  screening scan for  04/2022 and fax results to PCP. Thanks so much. There was notation of Aortic atherosclerosis and artifact vs a tiny gallstone. Please have him follow up with his PCP for any further diagnostics he feels is necessary , if any.

## 2021-05-14 NOTE — Telephone Encounter (Signed)
Patient was scheduled with Dr. Ky Barban on 06/03/2021.

## 2021-05-16 ENCOUNTER — Other Ambulatory Visit: Payer: Self-pay | Admitting: Internal Medicine

## 2021-06-01 ENCOUNTER — Encounter: Payer: Self-pay | Admitting: Family Medicine

## 2021-06-03 ENCOUNTER — Encounter: Payer: BC Managed Care – PPO | Admitting: Family Medicine

## 2021-06-04 ENCOUNTER — Encounter: Payer: BC Managed Care – PPO | Admitting: Physician Assistant

## 2021-06-04 ENCOUNTER — Other Ambulatory Visit: Payer: Self-pay | Admitting: Nurse Practitioner

## 2021-06-04 ENCOUNTER — Other Ambulatory Visit: Payer: Self-pay | Admitting: Family Medicine

## 2021-06-04 NOTE — Telephone Encounter (Signed)
Requested Prescriptions  Pending Prescriptions Disp Refills   ezetimibe (ZETIA) 10 MG tablet [Pharmacy Med Name: EZETIMIBE 10 MG TAB] 90 tablet 1    Sig: TAKE 1 TABLET BY MOUTH DAILY.     Cardiovascular:  Antilipid - Sterol Transport Inhibitors Passed - 06/04/2021 10:09 AM      Passed - Total Cholesterol in normal range and within 360 days    Cholesterol, Total  Date Value Ref Range Status  11/19/2020 123 100 - 199 mg/dL Final         Passed - LDL in normal range and within 360 days    LDL Cholesterol (Calc)  Date Value Ref Range Status  05/25/2017 115 (H) mg/dL (calc) Final    Comment:    Reference range: <100 . Desirable range <100 mg/dL for primary prevention;   <70 mg/dL for patients with CHD or diabetic patients  with > or = 2 CHD risk factors. Marland Kitchen LDL-C is now calculated using the Martin-[facility]kins  calculation, which is a validated novel method providing  better accuracy than the Friedewald equation in the  estimation of LDL-C.  Cresenciano Genre et al. Annamaria Helling. 1941;740(81): 2061-2068  (http://education.[employer].com/faq/FAQ164)    LDL Chol Calc (NIH)  Date Value Ref Range Status  11/19/2020 54 0 - 99 mg/dL Final         Passed - HDL in normal range and within 360 days    HDL  Date Value Ref Range Status  11/19/2020 53 >39 mg/dL Final         Passed - Triglycerides in normal range and within 360 days    Triglycerides  Date Value Ref Range Status  11/19/2020 78 0 - 149 mg/dL Final         Passed - Valid encounter within last 12 months    Recent Outpatient Visits          6 months ago COPD with acute exacerbation [facility])   [facility] Jerrol Banana., MD   1 year ago Annual physical exam   [facility] Jerrol Banana., MD   1 year ago Cough   [facility] Jerrol Banana., MD   1 year ago COPD with acute exacerbation [facility])   [place] Jerrol Banana., MD   1 year ago  Pulmonary emphysema, unspecified emphysema type [facility])   [facility] Jerrol Banana., MD      Future Appointments            In 3 weeks Chesley Mires, MD [facility]   In 3 months End, Harrell Gave, MD [facility], [place]   In 3 months Jerrol Banana., MD [facility], [place]

## 2021-06-19 DIAGNOSIS — M9903 Segmental and somatic dysfunction of lumbar region: Secondary | ICD-10-CM | POA: Diagnosis not present

## 2021-06-19 DIAGNOSIS — M461 Sacroiliitis, not elsewhere classified: Secondary | ICD-10-CM | POA: Diagnosis not present

## 2021-06-19 DIAGNOSIS — M9904 Segmental and somatic dysfunction of sacral region: Secondary | ICD-10-CM | POA: Diagnosis not present

## 2021-06-19 DIAGNOSIS — M5451 Vertebrogenic low back pain: Secondary | ICD-10-CM | POA: Diagnosis not present

## 2021-06-22 DIAGNOSIS — M9904 Segmental and somatic dysfunction of sacral region: Secondary | ICD-10-CM | POA: Diagnosis not present

## 2021-06-22 DIAGNOSIS — M9903 Segmental and somatic dysfunction of lumbar region: Secondary | ICD-10-CM | POA: Diagnosis not present

## 2021-06-22 DIAGNOSIS — M5451 Vertebrogenic low back pain: Secondary | ICD-10-CM | POA: Diagnosis not present

## 2021-06-22 DIAGNOSIS — M461 Sacroiliitis, not elsewhere classified: Secondary | ICD-10-CM | POA: Diagnosis not present

## 2021-06-24 DIAGNOSIS — M461 Sacroiliitis, not elsewhere classified: Secondary | ICD-10-CM | POA: Diagnosis not present

## 2021-06-24 DIAGNOSIS — M9904 Segmental and somatic dysfunction of sacral region: Secondary | ICD-10-CM | POA: Diagnosis not present

## 2021-06-24 DIAGNOSIS — M5451 Vertebrogenic low back pain: Secondary | ICD-10-CM | POA: Diagnosis not present

## 2021-06-24 DIAGNOSIS — M9903 Segmental and somatic dysfunction of lumbar region: Secondary | ICD-10-CM | POA: Diagnosis not present

## 2021-06-29 ENCOUNTER — Encounter: Payer: Self-pay | Admitting: Pulmonary Disease

## 2021-06-29 ENCOUNTER — Ambulatory Visit: Payer: BC Managed Care – PPO | Admitting: Pulmonary Disease

## 2021-06-29 ENCOUNTER — Other Ambulatory Visit: Payer: Self-pay

## 2021-06-29 VITALS — BP 120/64 | HR 64 | Temp 98.8°F | Ht 69.0 in | Wt 213.8 lb

## 2021-06-29 DIAGNOSIS — M9904 Segmental and somatic dysfunction of sacral region: Secondary | ICD-10-CM | POA: Diagnosis not present

## 2021-06-29 DIAGNOSIS — R053 Chronic cough: Secondary | ICD-10-CM

## 2021-06-29 DIAGNOSIS — Z87891 Personal history of nicotine dependence: Secondary | ICD-10-CM | POA: Diagnosis not present

## 2021-06-29 DIAGNOSIS — M5451 Vertebrogenic low back pain: Secondary | ICD-10-CM | POA: Diagnosis not present

## 2021-06-29 DIAGNOSIS — M9903 Segmental and somatic dysfunction of lumbar region: Secondary | ICD-10-CM | POA: Diagnosis not present

## 2021-06-29 DIAGNOSIS — M461 Sacroiliitis, not elsewhere classified: Secondary | ICD-10-CM | POA: Diagnosis not present

## 2021-06-29 DIAGNOSIS — J4540 Moderate persistent asthma, uncomplicated: Secondary | ICD-10-CM

## 2021-06-29 NOTE — Progress Notes (Signed)
[place] [facility], Critical Care, and Sleep Medicine  Chief Complaint  Patient presents with   Follow-up    4 month follow up for asthma. Pt states that he doesn't feel any difference with what was given last office visit.     Constitutional:  BP 120/64 (BP Location: Right Arm, Patient Position: Sitting, Cuff Size: Normal)    Pulse 64    Temp 98.8 F (37.1 C) (Oral)    Ht 5\' 9"  (1.753 m)    Wt 213 lb 12.8 oz (97 kg)    SpO2 96%    BMI 31.57 kg/m   Past Medical History:  CAD, GERD, HTN, HLD  Past Surgical History:  He  has a past surgical history that includes Tonsillectomy and adenoidectomy; Cyst excision perineal; Carotid endarterectomy (Right, 2009); Colonoscopy with propofol (N/A, 06/16/2017); Esophagogastroduodenoscopy (egd) with propofol (N/A, 06/16/2017); polypectomy (06/16/2017); and LEFT HEART CATH AND CORONARY ANGIOGRAPHY (N/A, 07/01/2017).  Brief Summary:  Christopher Villegas is a 66 y.o. male former smoker with chronic cough and wheeze.      Subjective:   His cough is much better.  Not having wheeze.  Gets winded occasionally when he does more activity.  Not having chest pain, sinus congestion, or sputum.  Hasn't needed albuterol  Low dose CT chest from November 2022 showed stable mild emphysema and small nodules.  Also showed atherosclerosis.  Physical Exam:   Appearance - well kempt   ENMT - no sinus tenderness, no oral exudate, no LAN, Mallampati 3 airway, no stridor  Respiratory - equal breath sounds bilaterally, no wheezing or rales  CV - s1s2 regular rate and rhythm, no murmurs  Ext - no clubbing, no edema  Skin - no rashes  Psych - normal mood and affect     Pulmonary testing:  PFT 02/09/21 >> FEV1 3.22 (102%), FEV1% 74, TLC 7.77 (117%), RV 3.60 (159%), DLCO 89%, +BD  Chest Imaging:  LDCT chest 04/29/20 >> small nodules up to 3.7 mm, mild bronchial wall thickening, mild centrilobular and paraseptal emphysema LDCT 05/07/21 >> mild emphysema, b/l  nodules up to 3.5 mm  Social History:  He  reports that he quit smoking about 6 years ago. His smoking use included cigarettes. He has a 67.50 pack-year smoking history. He has never used smokeless tobacco. He reports that he does not drink alcohol and does not use drugs.  Family History:  His family history includes Arthritis in his mother; Autoimmune disease in his sister; Cancer in his mother; Healthy in his son and son; Heart disease in his father; Hyperlipidemia in his mother; Hypertension in his father and mother; Kidney failure in his father; Lupus in his sister.     Assessment/Plan:   Mild, intermittent asthma. - even though he has history of tobacco abuse and mild changes of emphysema on CT imaging, PFT doesn't show airflow obstruction at this time - he has leg cramps from breztri and dulera; suspect he is not able to tolerate LABA and/or LAMA - improved since last visit - he can try stopping singulair - if he is stable after several weeks, then he can try stopping arnuity - prn albuterol  History of tobacco abuse. - he will get follow up LDCT chest in November 2023  Exercised induced asthma. - discussed doing high intensity warm up and using albuterol before exercise as needed, but should improve with dulera therapy  Time Spent Involved in Patient Care on Day of Examination:  36 minutes  Follow up:  Patient Instructions  You can try stopping singulair.  If your symptoms are stable after about 3 to 4 weeks, then you can try stopping arnuity.  Follow up in December 2023.  Medication List:   Allergies as of 06/29/2021       Reactions   Plavix [clopidogrel] Rash        Medication List        Accurate as of June 29, 2021 10:32 AM. If you have any questions, ask your nurse or doctor.          albuterol 108 (90 Base) MCG/ACT inhaler Commonly known as: VENTOLIN HFA INHALE 1 PUFF INTO THE LUNGS EVERY MORNING   Arnuity Ellipta 100 MCG/ACT Aepb Generic  drug: Fluticasone Furoate Inhale 1 puff into the lungs daily.   aspirin EC 81 MG tablet Take 81 mg by mouth every other day. Swallow whole.   ezetimibe 10 MG tablet Commonly known as: ZETIA TAKE 1 TABLET BY MOUTH DAILY.   isosorbide mononitrate 30 MG 24 hr tablet Commonly known as: IMDUR TAKE 1 TABLET BY MOUTH DAILY.   losartan 50 MG tablet Commonly known as: COZAAR TAKE 1 TABLET BY MOUTH DAILY   metoprolol tartrate 25 MG tablet Commonly known as: LOPRESSOR TAKE ONE TABLET TWICE DAILY   montelukast 10 MG tablet Commonly known as: SINGULAIR Take 1 tablet (10 mg total) by mouth at bedtime.   omeprazole 20 MG capsule Commonly known as: PRILOSEC Take 1 capsule (20 mg total) by mouth daily.   rosuvastatin 10 MG tablet Commonly known as: CRESTOR TAKE 1 TABLET BY MOUTH ONCE DAILY        Signature:  Chesley Mires, MD [place] Pager - [phone] 06/29/2021, 10:32 AM

## 2021-06-29 NOTE — Patient Instructions (Signed)
You can try stopping singulair.  If your symptoms are stable after about 3 to 4 weeks, then you can try stopping arnuity.  Follow up in December 2023.

## 2021-07-08 ENCOUNTER — Other Ambulatory Visit: Payer: Self-pay | Admitting: Pulmonary Disease

## 2021-07-21 DIAGNOSIS — M9903 Segmental and somatic dysfunction of lumbar region: Secondary | ICD-10-CM | POA: Diagnosis not present

## 2021-07-21 DIAGNOSIS — M461 Sacroiliitis, not elsewhere classified: Secondary | ICD-10-CM | POA: Diagnosis not present

## 2021-07-21 DIAGNOSIS — M9904 Segmental and somatic dysfunction of sacral region: Secondary | ICD-10-CM | POA: Diagnosis not present

## 2021-07-21 DIAGNOSIS — M5451 Vertebrogenic low back pain: Secondary | ICD-10-CM | POA: Diagnosis not present

## 2021-07-22 DIAGNOSIS — M25562 Pain in left knee: Secondary | ICD-10-CM | POA: Diagnosis not present

## 2021-07-30 ENCOUNTER — Telehealth: Payer: Self-pay

## 2021-07-30 NOTE — Telephone Encounter (Signed)
Copied from [place] [phone]. Topic: Appointment Scheduling - Scheduling Inquiry for Clinic >> Jul 30, 2021  2:38 PM Valere Dross wrote: Reason for CRM: Pt called in stating why his April appt was canceled, pt was very upset stating he had already had to have it reschedule from December and then April, and the pt was very upset, and requested to speak with Arbie Cookey, to see what was going on. Please advise.

## 2021-08-11 DIAGNOSIS — M25562 Pain in left knee: Secondary | ICD-10-CM | POA: Diagnosis not present

## 2021-08-18 ENCOUNTER — Other Ambulatory Visit: Payer: Self-pay | Admitting: Internal Medicine

## 2021-08-19 DIAGNOSIS — M25562 Pain in left knee: Secondary | ICD-10-CM | POA: Diagnosis not present

## 2021-08-29 ENCOUNTER — Other Ambulatory Visit: Payer: Self-pay | Admitting: Internal Medicine

## 2021-09-01 DIAGNOSIS — M84352D Stress fracture, left femur, subsequent encounter for fracture with routine healing: Secondary | ICD-10-CM | POA: Diagnosis not present

## 2021-09-01 DIAGNOSIS — S83241A Other tear of medial meniscus, current injury, right knee, initial encounter: Secondary | ICD-10-CM | POA: Diagnosis not present

## 2021-09-01 DIAGNOSIS — M25562 Pain in left knee: Secondary | ICD-10-CM | POA: Diagnosis not present

## 2021-09-04 ENCOUNTER — Encounter: Payer: Self-pay | Admitting: Internal Medicine

## 2021-09-04 ENCOUNTER — Ambulatory Visit: Payer: BC Managed Care – PPO | Admitting: Internal Medicine

## 2021-09-04 ENCOUNTER — Other Ambulatory Visit: Payer: Self-pay

## 2021-09-04 VITALS — BP 116/78 | HR 68 | Ht 69.0 in | Wt 195.0 lb

## 2021-09-04 DIAGNOSIS — Z79899 Other long term (current) drug therapy: Secondary | ICD-10-CM

## 2021-09-04 DIAGNOSIS — G720 Drug-induced myopathy: Secondary | ICD-10-CM

## 2021-09-04 DIAGNOSIS — I2510 Atherosclerotic heart disease of native coronary artery without angina pectoris: Secondary | ICD-10-CM | POA: Diagnosis not present

## 2021-09-04 DIAGNOSIS — E785 Hyperlipidemia, unspecified: Secondary | ICD-10-CM | POA: Diagnosis not present

## 2021-09-04 DIAGNOSIS — I10 Essential (primary) hypertension: Secondary | ICD-10-CM

## 2021-09-04 DIAGNOSIS — I25118 Atherosclerotic heart disease of native coronary artery with other forms of angina pectoris: Secondary | ICD-10-CM

## 2021-09-04 DIAGNOSIS — J45909 Unspecified asthma, uncomplicated: Secondary | ICD-10-CM

## 2021-09-04 DIAGNOSIS — T466X5A Adverse effect of antihyperlipidemic and antiarteriosclerotic drugs, initial encounter: Secondary | ICD-10-CM

## 2021-09-04 NOTE — Progress Notes (Signed)
? ?Follow-up Outpatient Visit ?Date: 09/04/2021 ? ?Primary Care Provider: ?Jerrol Banana., MD ?[address] ?[place] [address] ? ?Chief Complaint: Follow-up CAD ? ?HPI:  Christopher Villegas is a 66 y.o. male with history of coronary artery disease with chronic total occlusion of the mid RCA managed medically, hypertension, hyperlipidemia, and carotid artery stenosis status post right carotid endarterectomy, who presents for follow-up of coronary artery disease.  He was last seen in our office in 02/2021 by Ignacia Bayley, NP, at which time he was feeling well.  Today, Christopher Villegas reports that he has been doing well from a heart standpoint, though he has been dealing with back and left knee issues over the last few months.  He was recently diagnosed with a left meniscus tear and stress fracture and is currently non-weight bearing.  He denies chest pain, shortness of breath, palpitations, lightheadedness, and edema.  He is no longer on Singulair and standing inhaler therapy, though he still has an albuterol MDI rescue inhaler. ? ?-------------------------------------------------------------------------------------------------- ? ?Cardiovascular History & Procedures: ?Cardiovascular Problems: ?Coronary artery disease with stable angina ?  ?Risk Factors: ?Cerebrovascular disease, hypertension, hyperlipidemia, male gender, obesity, age greater than 36, and history of tobacco use. ?  ?Cath/PCI: ?LHC (07/01/17): LMCA with 30% ostial and distal lesions. LAD with 30% midvessel stenosis. LCX with 20% ostial and 40% proximal lesions. RCA with 40% diffuse midstenosis and chronic total occlusion of the mid/distal RCA. Left-to-right and right-to-right collaterals are present ?  ?CV Surgery: ?Right carotid endarterectomy (12/2007, Dr. Lucky Cowboy) ?  ?EP Procedures and Devices: ?None ?  ?Non-Invasive Evaluation(s): ?Carotid Doppler (05/19/18): Mild bilateral internal carotid artery stenoses.  Greater than 50% stenosis of the right  external carotid artery.  Antegrade flow in both vertebral arteries.  Normal flow hemodynamics in both subclavian arteries. ?Carotid Doppler (05/11/17): Patent right carotid endarterectomy site with no significant internal carotid artery stenosis.  Mild left proximal internal carotid artery disease (less than 40%).  No significant change from prior study in 04/2016. ?Exercise MPI (11/12/15): No ischemia or scar.  LVEF mildly reduced at 45%.  Good exercise capacity. ?TTE (11/12/15): Normal biventricular systolic function (LVEF 71%).  Mild TR and MR. ? ?Recent CV Pertinent Labs: ?Lab Results  ?Component Value Date  ? CHOL 135 09/04/2021  ? HDL 54 09/04/2021  ? [place] 68 09/04/2021  ? LDLCALC 115 (H) 05/25/2017  ? TRIG 63 09/04/2021  ? CHOLHDL 2.5 09/04/2021  ? CHOLHDL 2.4 08/08/2017  ? INR 1.0 06/22/2017  ? K 5.3 (H) 09/04/2021  ? BUN 23 09/04/2021  ? CREATININE 1.21 09/04/2021  ? CREATININE 1.32 (H) 04/06/2017  ? ? ?Past medical and surgical history were reviewed and updated in EPIC. ? ?Current Meds  ?Medication Sig  ? aspirin EC 81 MG tablet Take 81 mg by mouth every other day. Swallow whole.  ? ezetimibe (ZETIA) 10 MG tablet TAKE 1 TABLET BY MOUTH DAILY.  ? isosorbide mononitrate (IMDUR) 30 MG 24 hr tablet TAKE 1 TABLET BY MOUTH DAILY.  ? losartan (COZAAR) 50 MG tablet TAKE 1 TABLET BY MOUTH DAILY  ? metoprolol tartrate (LOPRESSOR) 25 MG tablet TAKE ONE TABLET TWICE DAILY  ? rosuvastatin (CRESTOR) 10 MG tablet TAKE 1 TABLET BY MOUTH DAILY  ? ? ?Allergies: Plavix [clopidogrel] ? ?Social History  ? ?Tobacco Use  ? Smoking status: Former  ?  Packs/day: 1.50  ?  Years: 45.00  ?  Pack years: 67.50  ?  Types: Cigarettes  ?  Quit date: 09/29/2014  ?  Years since quitting: 6.9  ? Smokeless tobacco: Never  ?Vaping Use  ? Vaping Use: Never used  ?Substance Use Topics  ? Alcohol use: No  ?  Comment: Quit 1990  ? Drug use: No  ? ? ?Family History  ?Problem Relation Age of Onset  ? Cancer Mother   ?     breast  ? Hypertension  Mother   ? Hyperlipidemia Mother   ? Arthritis Mother   ?     osteo  ? Hypertension Father   ? Heart disease Father   ?     CHF  ? Kidney failure Father   ?     causing death  ? Autoimmune disease Sister   ? Lupus Sister   ? Healthy Son   ? Healthy Son   ? ? ?Review of Systems: ?A 12-system review of systems was performed and was negative except as noted in the HPI. ? ?-------------------------------------------------------------------------------------------------- ? ?Physical Exam: ?BP 116/78 (BP Location: Left Arm, Patient Position: Sitting, Cuff Size: Large)   Pulse 68   Ht '5\' 9"'$  (1.753 m)   Wt 195 lb (88.5 kg)   SpO2 98%   BMI 28.80 kg/m?  ? ?General:  NAD. ?Neck: No JVD or HJR. ?Lungs: Clear to auscultation bilaterally without wheezes or crackles. ?Heart: Regular rate and rhythm without murmurs, rubs, or gallops. ?Abdomen: Soft, nontender, nondistended. ?Extremities: No lower extremity edema. ? ?EKG:  Normal sinus rhythm without abnormalities. ? ?Lab Results  ?Component Value Date  ? WBC 7.1 09/04/2021  ? HGB 14.3 09/04/2021  ? HCT 41.7 09/04/2021  ? MCV 90 09/04/2021  ? PLT 255 09/04/2021  ? ? ?Lab Results  ?Component Value Date  ? NA 138 09/04/2021  ? K 5.3 (H) 09/04/2021  ? CL 103 09/04/2021  ? CO2 21 09/04/2021  ? BUN 23 09/04/2021  ? CREATININE 1.21 09/04/2021  ? GLUCOSE 106 (H) 09/04/2021  ? ALT 26 09/04/2021  ? ? ?Lab Results  ?Component Value Date  ? CHOL 135 09/04/2021  ? HDL 54 09/04/2021  ? [place] 68 09/04/2021  ? TRIG 63 09/04/2021  ? CHOLHDL 2.5 09/04/2021  ? ? ?-------------------------------------------------------------------------------------------------- ? ?ASSESSMENT AND PLAN: ?Coronary artery disease with stable angina: ?No chest pain or significant shortness of breath reported, though functional capacity has been limited over the last few months due to back and left knee problems.  We will plan to continue his current antianginal therapy consisting of low-dose metoprolol and  isosorbide mononitrate, as well as aspirin, rosuvastatin, and ezetimibe for secondary prevention.  We will check a CBC, CMP, and lipid panel today. ? ?Hyperlipidemia: ?We will recheck a lipid panel today to ensure that his lipids are at goal (LDL < 70).  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 ? ?Hypertension: ?BP well-controlled today.  Continue current medications. ? ?Hyperlipidemia: ?Continue rosuvastatin and ezetimibe with lipid panel, CMP, and CK today. ? ?Asthma: ?Breathing improved with interval cessation of Singulair and Arnuity Ellipta.  Continue follow-up per pulmonary. ? ?Follow-up: Return to clinic in 1 year. ? ?Nelva Bush, MD ?09/05/2021 ?1:54 PM ? ?

## 2021-09-04 NOTE — Patient Instructions (Signed)
Medication Instructions:  ? ?Your physician recommends that you continue on your current medications as directed. Please refer to the Current Medication list given to you today. ? ?*If you need a refill on your cardiac medications before your next appointment, please call your pharmacy* ? ? ?Lab Work: ? ?Today at the medical mall: CBC, CMET, Lipid panel, Total Creatine Kinase ? ?If you have labs (blood work) drawn today and your tests are completely normal, you will receive your results only by: ?MyChart Message (if you have MyChart) OR ?A paper copy in the mail ?If you have any lab test that is abnormal or we need to change your treatment, we will call you to review the results. ? ? ?Testing/Procedures: ? ?None ordered ? ? ?Follow-Up: ?At [facility], you and your health needs are our priority.  As part of our continuing mission to provide you with exceptional heart care, we have created designated Provider Care Teams.  These Care Teams include your primary Cardiologist (physician) and Advanced Practice Providers (APPs -  Physician Assistants and Nurse Practitioners) who all work together to provide you with the care you need, when you need it. ? ?We recommend signing up for the patient portal called "MyChart".  Sign up information is provided on this After Visit Summary.  MyChart is used to connect with patients for Virtual Visits (Telemedicine).  Patients are able to view lab/test results, encounter notes, upcoming appointments, etc.  Non-urgent messages can be sent to your provider as well.   ?To learn more about what you can do with MyChart, go to [email or web address].   ? ?Your next appointment:   ?1 year(s) ? ?The format for your next appointment:   ?In Person ? ?Provider:   ?You may see Nelva Bush, MD or one of the following Advanced Practice Providers on your designated Care Team:   ?Murray Hodgkins, NP ?Christell Faith, PA-C ?Cadence Kathlen Mody, PA-C ?

## 2021-09-05 ENCOUNTER — Encounter: Payer: Self-pay | Admitting: Internal Medicine

## 2021-09-05 DIAGNOSIS — Z79899 Other long term (current) drug therapy: Secondary | ICD-10-CM | POA: Insufficient documentation

## 2021-09-05 DIAGNOSIS — G720 Drug-induced myopathy: Secondary | ICD-10-CM | POA: Insufficient documentation

## 2021-09-05 DIAGNOSIS — J45909 Unspecified asthma, uncomplicated: Secondary | ICD-10-CM | POA: Insufficient documentation

## 2021-09-05 LAB — COMPREHENSIVE METABOLIC PANEL
ALT: 26 IU/L (ref 0–44)
AST: 36 IU/L (ref 0–40)
Albumin/Globulin Ratio: 1.7 (ref 1.2–2.2)
Albumin: 4.3 g/dL (ref 3.8–4.8)
Alkaline Phosphatase: 64 IU/L (ref 44–121)
BUN/Creatinine Ratio: 19 (ref 10–24)
BUN: 23 mg/dL (ref 8–27)
Bilirubin Total: 0.7 mg/dL (ref 0.0–1.2)
CO2: 21 mmol/L (ref 20–29)
Calcium: 9 mg/dL (ref 8.6–10.2)
Chloride: 103 mmol/L (ref 96–106)
Creatinine, Ser: 1.21 mg/dL (ref 0.76–1.27)
Globulin, Total: 2.6 g/dL (ref 1.5–4.5)
Glucose: 106 mg/dL — ABNORMAL HIGH (ref 70–99)
Potassium: 5.3 mmol/L — ABNORMAL HIGH (ref 3.5–5.2)
Sodium: 138 mmol/L (ref 134–144)
Total Protein: 6.9 g/dL (ref 6.0–8.5)
eGFR: 66 mL/min/{1.73_m2} (ref >59)

## 2021-09-05 LAB — LIPID PANEL
Chol/HDL Ratio: 2.5 ratio (ref 0.0–5.0)
Cholesterol, Total: 135 mg/dL (ref 100–199)
HDL: 54 mg/dL (ref >39)
LDL Chol Calc (NIH): 68 mg/dL (ref 0–99)
Triglycerides: 63 mg/dL (ref 0–149)
VLDL Cholesterol Cal: 13 mg/dL (ref 5–40)

## 2021-09-05 LAB — CBC
Hematocrit: 41.7 % (ref 37.5–51.0)
Hemoglobin: 14.3 g/dL (ref 13.0–17.7)
MCH: 30.9 pg (ref 26.6–33.0)
MCHC: 34.3 g/dL (ref 31.5–35.7)
MCV: 90 fL (ref 79–97)
Platelets: 255 10*3/uL (ref 150–450)
RBC: 4.63 x10E6/uL (ref 4.14–5.80)
RDW: 12.8 % (ref 11.6–15.4)
WBC: 7.1 10*3/uL (ref 3.4–10.8)

## 2021-09-05 LAB — CK: Total CK: 322 U/L (ref 41–331)

## 2021-09-09 ENCOUNTER — Telehealth: Payer: Self-pay | Admitting: *Deleted

## 2021-09-09 DIAGNOSIS — E875 Hyperkalemia: Secondary | ICD-10-CM

## 2021-09-09 NOTE — Telephone Encounter (Signed)
Spoke with pt. Notified of lab results and Dr. Darnelle Bos recc.  ?After reviewing potassium-rich foods, pt states that he eats 2 bananas every day.  ?Advised pt stop eating bananas and be mindful of other potassium-rich foods as well.  ?Pt voiced understanding. ?Pt scheduled for repeat BMET 10/05/21 at 0815 in our office.  ?Pt has no further questions at this time.  ?

## 2021-09-09 NOTE — Telephone Encounter (Signed)
-----   Message from Nelva Bush, MD sent at 09/07/2021  2:57 PM EDT ----- ?Please let Christopher Villegas know that his labs are stable other than minimal elevation in his potassium.  This can sometimes occur with medications like losartan.  I recommend that he avoid supplements containing potassium as well as limit his intake of potassium-rich foods.  I do not recommend any medication changes at this time.  I think it would be prudent to recheck a BMP in ~1 month to ensure that his potassium is not rising further.  His cholesterol is adequately controlled.  Blood counts and creatinine kinase are normal. ?

## 2021-09-16 ENCOUNTER — Encounter: Payer: BC Managed Care – PPO | Admitting: Family Medicine

## 2021-09-23 ENCOUNTER — Other Ambulatory Visit: Payer: Self-pay | Admitting: Internal Medicine

## 2021-09-29 DIAGNOSIS — M25562 Pain in left knee: Secondary | ICD-10-CM | POA: Diagnosis not present

## 2021-09-29 DIAGNOSIS — S83241D Other tear of medial meniscus, current injury, right knee, subsequent encounter: Secondary | ICD-10-CM | POA: Diagnosis not present

## 2021-09-29 DIAGNOSIS — M84352D Stress fracture, left femur, subsequent encounter for fracture with routine healing: Secondary | ICD-10-CM | POA: Diagnosis not present

## 2021-10-05 ENCOUNTER — Other Ambulatory Visit (INDEPENDENT_AMBULATORY_CARE_PROVIDER_SITE_OTHER): Payer: BC Managed Care – PPO

## 2021-10-05 DIAGNOSIS — E875 Hyperkalemia: Secondary | ICD-10-CM | POA: Diagnosis not present

## 2021-10-06 ENCOUNTER — Encounter: Payer: Self-pay | Admitting: Internal Medicine

## 2021-10-06 LAB — BASIC METABOLIC PANEL
BUN/Creatinine Ratio: 16 (ref 10–24)
BUN: 23 mg/dL (ref 8–27)
CO2: 21 mmol/L (ref 20–29)
Calcium: 9.1 mg/dL (ref 8.6–10.2)
Chloride: 100 mmol/L (ref 96–106)
Creatinine, Ser: 1.46 mg/dL — ABNORMAL HIGH (ref 0.76–1.27)
Glucose: 95 mg/dL (ref 70–99)
Potassium: 4.9 mmol/L (ref 3.5–5.2)
Sodium: 140 mmol/L (ref 134–144)
eGFR: 52 mL/min/{1.73_m2} — ABNORMAL LOW (ref >59)

## 2021-10-13 ENCOUNTER — Encounter: Payer: Self-pay | Admitting: Pulmonary Disease

## 2021-10-13 ENCOUNTER — Telehealth: Payer: Self-pay | Admitting: Pulmonary Disease

## 2021-10-13 NOTE — Telephone Encounter (Signed)
Dr Halford Chessman, please advise on pt's email, thanks! ? ?Dr. Halford Chessman, ?After my last visit in January I was able to stop the Singulair without noticing any differences at all in my breathing. Three weeks later I stopped the Arnuity inhaler and I didn't think my breathing was any different, although I had knee problems shortly thereafter so my activity level was curtailed. Since I have picked back up in my activity level I do notice that I am winded easier and although I haven't developed a cough, I do have some mild wheezing returning. Do you think I should go back on a daily inhaler? If so, which one? The Arnuity didn't seem to make much of a difference in my mind when I was on it, although that may be how it works. If you feel here is another one we could try, I would be fine with that. Or should I just go back on the Arnuity and give it another chance? Either way, I would need a prescription called in to my pharmacy. I just don't want to let the wheezing get out of control again. Thank you for your time, Latif Nazareno  ?

## 2021-10-13 NOTE — Telephone Encounter (Signed)
Please let him know he should restart arnuity.  Please send script for arnuity 100 one puff daily.  He should try this for 2 weeks and then send a message back updating his status. ?

## 2021-10-14 MED ORDER — ARNUITY ELLIPTA 100 MCG/ACT IN AEPB
1.0000 | INHALATION_SPRAY | Freq: Every day | RESPIRATORY_TRACT | 0 refills | Status: DC
Start: 2021-10-14 — End: 2022-04-13

## 2021-10-14 NOTE — Telephone Encounter (Signed)
Refer to phone encounter from 5/2. ?

## 2021-10-14 NOTE — Telephone Encounter (Signed)
Called and spoke with pt letting him know the recs per VS and he verbalized understanding. Rx for arnuity has been sent to preferred pharmacy for pt. Nothing further needed. ?

## 2021-10-29 ENCOUNTER — Other Ambulatory Visit: Payer: Self-pay | Admitting: Nurse Practitioner

## 2021-11-17 ENCOUNTER — Other Ambulatory Visit: Payer: Self-pay | Admitting: Internal Medicine

## 2021-11-19 ENCOUNTER — Other Ambulatory Visit: Payer: Self-pay | Admitting: Family Medicine

## 2021-12-01 NOTE — Progress Notes (Unsigned)
Complete physical exam  I,Christopher Villegas,acting as a scribe for Wilhemena Durie, MD.,have documented all relevant documentation on the behalf of Wilhemena Durie, MD,as directed by  Wilhemena Durie, MD while in the presence of Wilhemena Durie, MD.   Patient: Christopher Villegas   DOB: 1956-03-28   66 y.o. Male  MRN: [id number] Visit Date: 12/02/2021  Today's healthcare provider: Wilhemena Durie, MD   Chief Complaint  Patient presents with   Annual Exam   Subjective    Christopher Villegas is a 66 y.o. male who presents today for a complete physical exam.  He reports consuming a general diet. Home exercise routine includes exercise bike. He generally feels well. He reports sleeping well. He does not have additional problems to discuss today.  HPI    Past Medical History:  Diagnosis Date   CAD (coronary artery disease)    a. 06/2017 Cath: LM 30ost/d, LAD 27m D1/2 large, nl, D3 small, LCX 20ost, 40p, OM1/2/3 nl, RCA 40/1068m CTO w/ L->R collats. RPDA fills via collats from 1st Septal. EF 55-65%-->Med rx (could consider CTO PCI for angina).   Carotid arterial disease ([facility]   a. 2009 s/p R CEA; b. 05/2018 U/S: RICA 06-19-17%RECA >5>50%LICA 06-22-30%LECA <5<67%  GERD (gastroesophageal reflux disease)    History of echocardiogram    a. 10/2015 Echo: EF 55%. Mild TR/MR.   Hyperlipidemia    Hypertension    Past Surgical History:  Procedure Laterality Date   CAROTID ENDARTERECTOMY Right 2009   COLONOSCOPY WITH PROPOFOL N/A 06/16/2017   Procedure: COLONOSCOPY WITH PROPOFOL;  Surgeon: WoLucilla LameMD;  [facility] Service: End[facility];  Laterality: N/A;   CYST EXCISION PERINEAL     ESOPHAGOGASTRODUODENOSCOPY (EGD) WITH PROPOFOL N/A 06/16/2017   Procedure: ESOPHAGOGASTRODUODENOSCOPY (EGD) WITH PROPOFOL;  Surgeon: WoLucilla LameMD;  [facility]: End[facility];  Laterality: N/A;   LEFT HEART CATH AND CORONARY ANGIOGRAPHY N/A  07/01/2017   Procedure: LEFT HEART CATH AND CORONARY ANGIOGRAPHY;  Surgeon: EnNelva BushMD;  [facility];  Service: Cardiovascular;  Laterality: N/A;   POLYPECTOMY  06/16/2017   Procedure: POLYPECTOMY;  Surgeon: WoLucilla LameMD;  [facility];  Service: Endoscopy;;   TONSILLECTOMY AND ADENOIDECTOMY     Social History   Socioeconomic History   Marital status: Married    Spouse name: Beth   Number of children: 2   Years of education: 16   Highest education level: Not on file  Occupational History   Occupation: [employer]  Tobacco Use   Smoking status: Former    Packs/day: 1.50    Years: 45.00    Total pack years: 67.50    Types: Cigarettes    Quit date: 09/29/2014    Years since quitting: 7.1   Smokeless tobacco: Never  Vaping Use   Vaping Use: Never used  Substance and Sexual Activity   Alcohol use: No    Comment: Quit 1990   Drug use: No   Sexual activity: Yes  Other Topics Concern   Not on file  Social History Narrative   Not on file   Social Determinants of Health   Financial Resource Strain: Not on file  Food Insecurity: Not on file  Transportation Needs: Not on file  Physical Activity: Not on file  Stress: Not on file  Social Connections: Not on file  Intimate Partner  Violence: Not on file   Family Status  Relation Name Status   Mother  Deceased   Father  Deceased   Sister  Alive   Son  Alive   Son  Alive   Family History  Problem Relation Age of Onset   Cancer Mother        breast   Hypertension Mother    Hyperlipidemia Mother    Arthritis Mother        osteo   Hypertension Father    Heart disease Father        CHF   Kidney failure Father        causing death   Autoimmune disease Sister    Lupus Sister    Healthy Son    Healthy Son    Allergies  Allergen Reactions   Plavix [Clopidogrel] Rash    Patient Care Team: Jerrol Banana., MD as PCP - General (Family  Medicine) End, Harrell Gave, MD as PCP - Cardiology (Cardiology)   Medications: Outpatient Medications Prior to Visit  Medication Sig   albuterol (VENTOLIN HFA) 108 (90 Base) MCG/ACT inhaler INHALE 1 PUFF INTO THE LUNGS EVERY MORNING   aspirin EC 81 MG tablet Take 81 mg by mouth every other day. Swallow whole.   ezetimibe (ZETIA) 10 MG tablet TAKE 1 TABLET BY MOUTH DAILY.   Fluticasone Furoate (ARNUITY ELLIPTA) 100 MCG/ACT AEPB Inhale 1 puff into the lungs daily.   isosorbide mononitrate (IMDUR) 30 MG 24 hr tablet TAKE 1 TABLET BY MOUTH DAILY   losartan (COZAAR) 50 MG tablet TAKE 1 TABLET BY MOUTH DAILY   metoprolol tartrate (LOPRESSOR) 25 MG tablet TAKE ONE (1) TABLET BY MOUTH TWO TIMES PER DAY   rosuvastatin (CRESTOR) 10 MG tablet TAKE 1 TABLET BY MOUTH DAILY   No facility-administered medications prior to visit.    Review of Systems  Hematological:  Bruises/bleeds easily.  All other systems reviewed and are negative.   {Labs  Heme  Chem  Endocrine  Serology  Results Review (optional):23779}  Objective     BP 135/74 (BP Location: Left Arm, Patient Position: Sitting, Cuff Size: Large)   Pulse 66   Resp 16   Ht '5\' 9"'$  (1.753 m)   Wt 211 lb (95.7 kg)   SpO2 97%   BMI 31.16 kg/m  {Show previous vital signs (optional):23777}   Physical Exam  ***  Last depression screening scores    12/02/2021    9:43 AM 05/29/2020   10:05 AM 05/29/2018    9:04 AM  PHQ 2/9 Scores  PHQ - 2 Score 0 0 0  PHQ- 9 Score 0 0    Last fall risk screening    12/02/2021    9:43 AM  Fall Risk   Falls in the past year? 0  Number falls in past yr: 0  Injury with Fall? 0  Risk for fall due to : No Fall Risks  Follow up Falls evaluation completed   Last Audit-C alcohol use screening    12/02/2021    9:43 AM  Alcohol Use Disorder Test (AUDIT)  1. How often do you have a drink containing alcohol? 0  2. How many drinks containing alcohol do you have on a typical day when you are drinking?  0  3. How often do you have six or more drinks on one occasion? 0  AUDIT-C Score 0   A score of 3 or more in women, and 4 or more in men indicates increased risk for  alcohol abuse, EXCEPT if all of the points are from question 1   No results found for any visits on 12/02/21.  Assessment & Plan    Routine Health Maintenance and Physical Exam  Exercise Activities and Dietary recommendations  Goals   None     Immunization History  Administered Date(s) Administered   Influenza Split 03/14/2020   Influenza,inj,Quad PF,6+ Mos 04/01/2016   Influenza,inj,quad, With Preservative 04/02/2019   Influenza-Unspecified 03/15/2017, 04/24/2020   PFIZER(Purple Top)SARS-COV-2 Vaccination 07/05/2019, 08/01/2019, 03/26/2020, 09/26/2020   Tdap 03/08/2008   Zoster Recombinat (Shingrix) 06/30/2017, 09/07/2017   Zoster, Live 04/23/2013    Health Maintenance  Topic Date Due   Pneumonia Vaccine 83+ Years old (1 - PCV) Never done   TETANUS/TDAP  03/08/2018   COVID-19 Vaccine (5 - Booster for Pfizer series) 11/21/2020   INFLUENZA VACCINE  01/12/2022   COLONOSCOPY (Pts 45-105yr Insurance coverage will need to be confirmed)  06/16/2022   Hepatitis C Screening  Completed   Zoster Vaccines- Shingrix  Completed   HPV VACCINES  Aged Out    Discussed health benefits of physical activity, and encouraged him to engage in regular exercise appropriate for his age and condition.  1. Annual physical exam   2. Coronary artery disease of native artery of native heart with stable angina pectoris (HCC)  - TSH  3. Essential hypertension  - TSH  4. Hyperlipidemia LDL goal <70  - TSH  5. ASCVD (arteriosclerotic cardiovascular disease)  - TSH  6. Stage 3a chronic kidney disease (HCC)  - TSH  7. Gastroesophageal reflux disease without esophagitis  - TSH  8. COPD with acute exacerbation (HCC)  - TSH  9. Screening for prostate cancer  - PSA  10. Need for Tdap vaccination  - Tdap vaccine  greater than or equal to 7yo IM  11. Need for pneumococcal vaccine  - Pneumococcal conjugate vaccine 20-valent (Prevnar 20)  12. Encounter for screening fecal occult blood testing  - IFOBT POC (occult bld, rslt in office); Negative      Return in about 1 year (around 12/03/2022).     {provider attestation***:1}   RWilhemena Durie MD  [phone]) [phone](fax)  [place]

## 2021-12-02 ENCOUNTER — Ambulatory Visit (INDEPENDENT_AMBULATORY_CARE_PROVIDER_SITE_OTHER): Payer: BC Managed Care – PPO | Admitting: Family Medicine

## 2021-12-02 ENCOUNTER — Encounter: Payer: Self-pay | Admitting: Family Medicine

## 2021-12-02 VITALS — BP 135/74 | HR 66 | Resp 16 | Ht 69.0 in | Wt 211.0 lb

## 2021-12-02 DIAGNOSIS — Z1211 Encounter for screening for malignant neoplasm of colon: Secondary | ICD-10-CM | POA: Diagnosis not present

## 2021-12-02 DIAGNOSIS — Z0000 Encounter for general adult medical examination without abnormal findings: Secondary | ICD-10-CM

## 2021-12-02 DIAGNOSIS — E785 Hyperlipidemia, unspecified: Secondary | ICD-10-CM | POA: Diagnosis not present

## 2021-12-02 DIAGNOSIS — I10 Essential (primary) hypertension: Secondary | ICD-10-CM | POA: Diagnosis not present

## 2021-12-02 DIAGNOSIS — Z125 Encounter for screening for malignant neoplasm of prostate: Secondary | ICD-10-CM

## 2021-12-02 DIAGNOSIS — Z23 Encounter for immunization: Secondary | ICD-10-CM

## 2021-12-02 DIAGNOSIS — N1831 Chronic kidney disease, stage 3a: Secondary | ICD-10-CM

## 2021-12-02 DIAGNOSIS — I25118 Atherosclerotic heart disease of native coronary artery with other forms of angina pectoris: Secondary | ICD-10-CM | POA: Diagnosis not present

## 2021-12-02 DIAGNOSIS — I2510 Atherosclerotic heart disease of native coronary artery without angina pectoris: Secondary | ICD-10-CM

## 2021-12-02 DIAGNOSIS — J441 Chronic obstructive pulmonary disease with (acute) exacerbation: Secondary | ICD-10-CM

## 2021-12-02 DIAGNOSIS — K219 Gastro-esophageal reflux disease without esophagitis: Secondary | ICD-10-CM

## 2021-12-02 LAB — IFOBT (OCCULT BLOOD): IFOBT: NEGATIVE

## 2021-12-03 DIAGNOSIS — Z125 Encounter for screening for malignant neoplasm of prostate: Secondary | ICD-10-CM | POA: Diagnosis not present

## 2021-12-03 DIAGNOSIS — I2510 Atherosclerotic heart disease of native coronary artery without angina pectoris: Secondary | ICD-10-CM | POA: Diagnosis not present

## 2021-12-03 DIAGNOSIS — E785 Hyperlipidemia, unspecified: Secondary | ICD-10-CM | POA: Diagnosis not present

## 2021-12-03 DIAGNOSIS — I25118 Atherosclerotic heart disease of native coronary artery with other forms of angina pectoris: Secondary | ICD-10-CM | POA: Diagnosis not present

## 2021-12-03 DIAGNOSIS — I10 Essential (primary) hypertension: Secondary | ICD-10-CM | POA: Diagnosis not present

## 2021-12-04 LAB — PSA: Prostate Specific Ag, Serum: 0.9 ng/mL (ref 0.0–4.0)

## 2021-12-04 LAB — TSH: TSH: 1.59 u[IU]/mL (ref 0.450–4.500)

## 2022-01-11 DIAGNOSIS — M79641 Pain in right hand: Secondary | ICD-10-CM | POA: Diagnosis not present

## 2022-01-15 ENCOUNTER — Other Ambulatory Visit: Payer: Self-pay

## 2022-01-15 ENCOUNTER — Telehealth: Payer: Self-pay

## 2022-01-15 DIAGNOSIS — Z86010 Personal history of colonic polyps: Secondary | ICD-10-CM

## 2022-01-15 NOTE — Telephone Encounter (Signed)
Gastroenterology Pre-Procedure Review  Request Date: Tuesday 06/22/21 Requesting Physician: Dr. Allen Norris  PATIENT REVIEW QUESTIONS: The patient responded to the following health history questions as indicated:    1. Are you having any GI issues? no 2. Do you have a personal history of Polyps? yes (06/16/2017) 3. Do you have a family history of Colon Cancer or Polyps? no 4. Diabetes Mellitus? no 5. Joint replacements in the past 12 months?no 6. Major health problems in the past 3 months?no 7. Any artificial heart valves, MVP, or defibrillator? CAD    MEDICATIONS & ALLERGIES:    Patient reports the following regarding taking any anticoagulation/antiplatelet therapy:   Plavix, Coumadin, Eliquis, Xarelto, Lovenox, Pradaxa, Brilinta, or Effient? no Aspirin? yes (81 mg daily)  Patient confirms/reports the following medications:  Current Outpatient Medications  Medication Sig Dispense Refill   albuterol (VENTOLIN HFA) 108 (90 Base) MCG/ACT inhaler INHALE 1 PUFF INTO THE LUNGS EVERY MORNING 8.5 g 1   aspirin EC 81 MG tablet Take 81 mg by mouth every other day. Swallow whole.     ezetimibe (ZETIA) 10 MG tablet TAKE 1 TABLET BY MOUTH DAILY. 90 tablet 1   Fluticasone Furoate (ARNUITY ELLIPTA) 100 MCG/ACT AEPB Inhale 1 puff into the lungs daily. 30 each 0   isosorbide mononitrate (IMDUR) 30 MG 24 hr tablet TAKE 1 TABLET BY MOUTH DAILY 30 tablet 3   losartan (COZAAR) 50 MG tablet TAKE 1 TABLET BY MOUTH DAILY 30 tablet 11   metoprolol tartrate (LOPRESSOR) 25 MG tablet TAKE ONE (1) TABLET BY MOUTH TWO TIMES PER DAY 60 tablet 10   rosuvastatin (CRESTOR) 10 MG tablet TAKE 1 TABLET BY MOUTH DAILY 90 tablet 0   No current facility-administered medications for this visit.    Patient confirms/reports the following allergies:  Allergies  Allergen Reactions   Plavix [Clopidogrel] Rash    No orders of the defined types were placed in this encounter.   AUTHORIZATION INFORMATION Primary  Insurance: 1D#: Group #:  Secondary Insurance: 1D#: Group #:  SCHEDULE INFORMATION: Date: 06/22/21 Time: Location: [facility]

## 2022-01-26 DIAGNOSIS — L814 Other melanin hyperpigmentation: Secondary | ICD-10-CM | POA: Diagnosis not present

## 2022-01-26 DIAGNOSIS — D492 Neoplasm of unspecified behavior of bone, soft tissue, and skin: Secondary | ICD-10-CM | POA: Diagnosis not present

## 2022-01-26 DIAGNOSIS — Z85828 Personal history of other malignant neoplasm of skin: Secondary | ICD-10-CM | POA: Diagnosis not present

## 2022-01-26 DIAGNOSIS — L821 Other seborrheic keratosis: Secondary | ICD-10-CM | POA: Diagnosis not present

## 2022-01-26 DIAGNOSIS — D229 Melanocytic nevi, unspecified: Secondary | ICD-10-CM | POA: Diagnosis not present

## 2022-01-26 DIAGNOSIS — L570 Actinic keratosis: Secondary | ICD-10-CM | POA: Diagnosis not present

## 2022-01-28 ENCOUNTER — Other Ambulatory Visit: Payer: Self-pay | Admitting: Nurse Practitioner

## 2022-02-02 ENCOUNTER — Other Ambulatory Visit: Payer: Self-pay | Admitting: Family Medicine

## 2022-02-02 DIAGNOSIS — I10 Essential (primary) hypertension: Secondary | ICD-10-CM

## 2022-02-17 ENCOUNTER — Other Ambulatory Visit: Payer: Self-pay | Admitting: Internal Medicine

## 2022-02-24 ENCOUNTER — Other Ambulatory Visit: Payer: Self-pay | Admitting: Family Medicine

## 2022-03-07 ENCOUNTER — Encounter: Payer: Self-pay | Admitting: Pulmonary Disease

## 2022-03-08 NOTE — Telephone Encounter (Signed)
Mychart message sent by pt: Christopher Dance II "Christopher"  P [facility] (supporting Chesley Mires, MD) 22 hours ago (4:23 PM)    Dr. Halford Chessman, My annual low dose CT scan has been scheduled in Nov by your office and I am waiting for them to schedule my annual visit with you in Dec. Two quick questions if I may. First, I am getting the new Covid booster this Monday; should I go ahead and schedule the RSV vaccine prior to our appointment? And secondly, I have continued with the Arnuity inhaler but I currently feel that I am not getting any benefit from it. If anything, I feel that I am getting winded easier and more often from exertion. I am not experiencing any wheezing. Could I possibly try going off of this again? If the wheezing returns maybe we could try something different? Or just use the Albuterol? Thanks for your time, Christopher Villegas       Dr. Halford Chessman, please advise.

## 2022-04-07 ENCOUNTER — Ambulatory Visit: Payer: BC Managed Care – PPO | Admitting: Internal Medicine

## 2022-04-13 ENCOUNTER — Encounter: Payer: Self-pay | Admitting: Internal Medicine

## 2022-04-13 ENCOUNTER — Ambulatory Visit: Payer: BC Managed Care – PPO | Admitting: Internal Medicine

## 2022-04-13 VITALS — BP 124/74 | HR 86 | Temp 98.2°F | Resp 17 | Ht 69.0 in | Wt 211.0 lb

## 2022-04-13 DIAGNOSIS — I6523 Occlusion and stenosis of bilateral carotid arteries: Secondary | ICD-10-CM | POA: Diagnosis not present

## 2022-04-13 DIAGNOSIS — N1831 Chronic kidney disease, stage 3a: Secondary | ICD-10-CM | POA: Diagnosis not present

## 2022-04-13 DIAGNOSIS — E785 Hyperlipidemia, unspecified: Secondary | ICD-10-CM

## 2022-04-13 DIAGNOSIS — K219 Gastro-esophageal reflux disease without esophagitis: Secondary | ICD-10-CM

## 2022-04-13 DIAGNOSIS — Z87891 Personal history of nicotine dependence: Secondary | ICD-10-CM

## 2022-04-13 DIAGNOSIS — I25118 Atherosclerotic heart disease of native coronary artery with other forms of angina pectoris: Secondary | ICD-10-CM

## 2022-04-13 DIAGNOSIS — I10 Essential (primary) hypertension: Secondary | ICD-10-CM | POA: Diagnosis not present

## 2022-04-13 DIAGNOSIS — Z86010 Personal history of colonic polyps: Secondary | ICD-10-CM

## 2022-04-13 DIAGNOSIS — R739 Hyperglycemia, unspecified: Secondary | ICD-10-CM

## 2022-04-13 DIAGNOSIS — J45909 Unspecified asthma, uncomplicated: Secondary | ICD-10-CM

## 2022-04-13 NOTE — Progress Notes (Signed)
Patient ID: Christopher Villegas, male   DOB: 1955-09-20, 66 y.o.   MRN: [id number]   Subjective:    Patient ID: Christopher Villegas, male    DOB: [birth year], 66 y.o.   MRN: [id number]   Patient here for  Chief Complaint  Patient presents with   Establish Care   .   HPI Previously seeing Dr Rosanna Randy.  History of coronary artery disease with chronic total occlusion of the mid RCA managed medically, hypertension, hyperlipidemia, and carotid artery stenosis status post right carotid endarterectomy.  Sees Dr End. Is exercising.  Doing well.  No chest pain or increased sob reported.  On crestor and zetia.  Occasional heartburn.  Can control with time of eating.  Has seen Dr Halford Chessman - adult onset asthma.  Smoked for 45 years.  Quit 6 years ago.  Has albuterol to take prn.  Breathing stable.  First of year had some back issues.  Saw chiropractor Grant [place]) - better.  Previous meniscus tear/stress fracture.  Knees better.  Overall he feels he is doing relatively well.  Previous alcohol addiction. Sober since 1990.    Past Medical History:  Diagnosis Date   Asthma    CAD (coronary artery disease)    a. 06/2017 Cath: LM 30ost/d, LAD 55m D1/2 large, nl, D3 small, LCX 20ost, 40p, OM1/2/3 nl, RCA 40/1068m CTO w/ L->R collats. RPDA fills via collats from 1st Septal. EF 55-65%-->Med rx (could consider CTO PCI for angina).   [facility]   Basal Cell carcinoma   Car[place] arterial disease ([facility]   a. 2009 s/p R CEA; b. 05/2018 U/S: RICA 06-16-53%RECA >5>73%LICA 06-15-18%LECA <5<25%  GERD (gastroesophageal reflux disease)    History of colon polyps    History of echocardiogram    a. 10/2015 Echo: EF 55%. Mild TR/MR.   Hyperlipidemia    Hypertension    Past Surgical History:  Procedure Laterality Date   BICEPS TENDON REPAIR  2021   CAROTID ENDARTERECTOMY Right 2009   COLONOSCOPY WITH PROPOFOL N/A 06/16/2017   Procedure: COLONOSCOPY WITH PROPOFOL;  Surgeon: WoLucilla LameMD;  [place] [facility];  Laterality: N/A;   CYST EXCISION PERINEAL     ESOPHAGOGASTRODUODENOSCOPY (EGD) WITH PROPOFOL N/A 06/16/2017   Procedure: ESOPHAGOGASTRODUODENOSCOPY (EGD) WITH PROPOFOL;  Surgeon: WoLucilla LameMD;  [place] [facility];  Laterality: N/A;   LEFT HEART CATH AND CORONARY ANGIOGRAPHY N/A 07/01/2017   Procedure: LEFT HEART CATH AND CORONARY ANGIOGRAPHY;  Surgeon: EnNelva BushMD;  [facility];  Service: Cardiovascular;  Laterality: N/A;   POLYPECTOMY  06/16/2017   Procedure: POLYPECTOMY;  Surgeon: WoLucilla LameMD;  [facility];  Service: Endoscopy;;   TONSILLECTOMY AND ADENOIDECTOMY     Family History  Problem Relation Age of Onset   Cancer Mother        breast   Hypertension Mother    Hyperlipidemia Mother    Arthritis Mother        osteo   Hypertension Father    Heart disease Father        CHF   Kidney failure Father        causing death   COPD Father    Alcohol abuse Father    Autoimmune disease Sister    Lupus Sister    Healthy Son    Healthy Son    Social History   Socioeconomic History  Marital status: Married    Spouse name: Beth   Number of children: 2   Years of education: 16   Highest education level: Not on file  Occupational History   Occupation: [employer]  Tobacco Use   Smoking status: Former    Packs/day: 1.50    Years: 45.00    Total pack years: 67.50    Types: Cigarettes    Quit date: 09/29/2014    Years since quitting: 7.5    Passive exposure: Past   Smokeless tobacco: Never  Vaping Use   Vaping Use: Never used  Substance and Sexual Activity   Alcohol use: No    Comment: Former Alcoholic - sober since 01/0/0712   Drug use: No   Sexual activity: Yes  Other Topics Concern   Not on file  Social History Narrative   Not on file   Social Determinants of Health   Financial Resource Strain: Not on file  Food Insecurity: Not on  file  Transportation Needs: Not on file  Physical Activity: Not on file  Stress: Not on file  Social Connections: Not on file     Review of Systems  Constitutional:  Negative for appetite change and unexpected weight change.  HENT:  Negative for congestion and sinus pressure.   Respiratory:  Negative for cough, chest tightness and shortness of breath.   Cardiovascular:  Negative for chest pain, palpitations and leg swelling.  Gastrointestinal:  Negative for abdominal pain, diarrhea, nausea and vomiting.  Genitourinary:  Negative for difficulty urinating and dysuria.  Musculoskeletal:  Negative for joint swelling and myalgias.  Skin:  Negative for color change and rash.  Neurological:  Negative for dizziness, light-headedness and headaches.  Psychiatric/Behavioral:  Negative for agitation and dysphoric mood.        Objective:     BP 124/74 (BP Location: Left Arm, Patient Position: Sitting, Cuff Size: Large)   Pulse 86   Temp 98.2 F (36.8 C) (Temporal)   Resp 17   Ht _0  (1.753 m)   Wt 211 lb (95.7 kg)   SpO2 98%   BMI 31.16 kg/m  Wt Readings from Last 3 Encounters:  04/13/22 211 lb (95.7 kg)  12/02/21 211 lb (95.7 kg)  09/04/21 195 lb (88.5 kg)    Physical Exam Vitals reviewed.  Constitutional:      General: He is not in acute distress.    Appearance: Normal appearance. He is well-developed.  HENT:     Head: Normocephalic and atraumatic.     Right Ear: External ear normal.     Left Ear: External ear normal.  Eyes:     General: No scleral icterus.       Right eye: No discharge.        Left eye: No discharge.     Conjunctiva/sclera: Conjunctivae normal.  Cardiovascular:     Rate and Rhythm: Normal rate and regular rhythm.  Pulmonary:     Effort: Pulmonary effort is normal. No respiratory distress.     Breath sounds: Normal breath sounds.  Abdominal:     General: Bowel sounds are normal.     Palpations: Abdomen is soft.     Tenderness: There is no  abdominal tenderness.  Musculoskeletal:        General: No swelling or tenderness.     Cervical back: Neck supple. No tenderness.  Lymphadenopathy:     Cervical: No cervical adenopathy.  Skin:    Findings: No erythema or rash.  Neurological:  Mental Status: He is alert.  Psychiatric:        Mood and Affect: Mood normal.        Behavior: Behavior normal.      Outpatient Encounter Medications as of 04/13/2022  Medication Sig   albuterol (VENTOLIN HFA) 108 (90 Base) MCG/ACT inhaler INHALE 1 PUFF INTO THE LUNGS EVERY MORNING   aspirin EC 81 MG tablet Take 81 mg by mouth every other day. Swallow whole.   ezetimibe (ZETIA) 10 MG tablet TAKE 1 TABLET BY MOUTH DAILY   isosorbide mononitrate (IMDUR) 30 MG 24 hr tablet TAKE 1 TABLET BY MOUTH DAILY   losartan (COZAAR) 50 MG tablet TAKE 1 TABLET BY MOUTH DAILY   metoprolol tartrate (LOPRESSOR) 25 MG tablet TAKE ONE (1) TABLET BY MOUTH TWO TIMES PER DAY   rosuvastatin (CRESTOR) 10 MG tablet TAKE 1 TABLET BY MOUTH DAILY   [DISCONTINUED] Fluticasone Furoate (ARNUITY ELLIPTA) 100 MCG/ACT AEPB Inhale 1 puff into the lungs daily.   No facility-administered encounter medications on file as of 04/13/2022.     Lab Results  Component Value Date   WBC 7.1 09/04/2021   HGB 14.3 09/04/2021   HCT 41.7 09/04/2021   PLT 255 09/04/2021   GLUCOSE 95 10/05/2021   CHOL 135 09/04/2021   TRIG 63 09/04/2021   HDL 54 09/04/2021   LDLCALC 68 09/04/2021   ALT 26 09/04/2021   AST 36 09/04/2021   NA 140 10/05/2021   K 4.9 10/05/2021   CL 100 10/05/2021   CREATININE 1.46 (H) 10/05/2021   BUN 23 10/05/2021   CO2 21 10/05/2021   TSH 1.590 12/03/2021   PSA 0.7 04/06/2017   INR 1.0 06/22/2017   HGBA1C 5.4 04/06/2017    CT CHEST LUNG CA SCREEN LOW DOSE W/O CM  Result Date: 05/07/2021 CLINICAL DATA:  Ex-smoker, quitting 7 years ago. Thirty pack-year history. EXAM: CT CHEST WITHOUT CONTRAST LOW-DOSE FOR LUNG CANCER SCREENING TECHNIQUE: Multidetector CT  imaging of the chest was performed following the standard protocol without IV contrast. COMPARISON:  04/29/2020 FINDINGS: Cardiovascular: Aortic atherosclerosis. Normal heart size, without pericardial effusion. Left main and 3 vessel coronary artery calcification. Mediastinum/Nodes: No mediastinal or definite hilar adenopathy, given limitations of unenhanced CT. Lungs/Pleura: No pleural fluid. Mild centrilobular emphysema. No change in bilateral pulmonary nodules of maximally volume derived equivalent diameter 3.5 mm. Upper Abdomen: Incompletely imaged gallbladder with possible tiny stone versus artifact within. Normal imaged portions of the liver, spleen, stomach, pancreas, adrenal glands, kidneys. Musculoskeletal: Remote left seventh posterolateral rib fracture. Lower cervical spondylosis. IMPRESSION: 1. Lung-RADS 2, benign appearance or behavior. Continue annual screening with low-dose chest CT without contrast in 12 months. 2. Aortic Atherosclerosis (ICD10-I70.0) and Emphysema (ICD10-J43.9). Coronary artery atherosclerosis. 3. Artifact versus tiny gallstone. Electronically Signed   By: Abigail Miyamoto M.D.   On: 05/07/2021 16:37      Assessment & Plan:   Problem List Items Addressed This Visit     carotid artery disease - Primary    carotid artery stenosis status post right carotid endarterectomy. Continue risk factor modification.  Continues on crestor and zetia. Continue aspirin.       Chronic kidney disease, stage 3 (HCC)    Avoid antiinflammatories.  Continue losartan.  Follow metabolic panel.       Coronary artery disease of native artery of native heart with stable angina pectoris ([place])    History of coronary artery disease with chronic total occlusion of the mid RCA managed medically.  Sees Dr End.  Continue aspirin, crestor and zetia.  Currently no chest pain or sob.  Getting back into exercising.       Essential hypertension    Continue losartan.  Follow pressures.  Follow metabolic  panel.       Gastroesophageal reflux disease without esophagitis    No significant acid reflux reported.  Occasional heartburn.  Controls with controlling time eating.  Follow.       History of colon polyps    Colonoscopy 06/2017 - Three 2 to 4 mm polyps in the descending colon. One 3 mm polyp in the sigmoid colon.  Recommended f/u 5 years.  Colonoscopy scheduled for 06/2022.       Hyperglycemia    Documented elevation.  Check met b and A1c. Low carb diet and exercise.       Hyperlipidemia LDL goal <70    Low cholesterol diet and exercise.  On crestor and zetia.  Follow lipid panel and liver function tests.       Personal history of tobacco use, presenting hazards to health    Quit smoking 6 years ago.  States smoked for 45 years.       Uncomplicated asthma    Has seen Dr Halford Chessman.  Has albuterol to take prn.  Breathing stable.          Einar Pheasant, MD

## 2022-04-15 ENCOUNTER — Other Ambulatory Visit: Payer: Self-pay

## 2022-04-15 ENCOUNTER — Telehealth: Payer: Self-pay | Admitting: Internal Medicine

## 2022-04-15 DIAGNOSIS — R739 Hyperglycemia, unspecified: Secondary | ICD-10-CM

## 2022-04-15 DIAGNOSIS — E785 Hyperlipidemia, unspecified: Secondary | ICD-10-CM

## 2022-04-15 DIAGNOSIS — I10 Essential (primary) hypertension: Secondary | ICD-10-CM

## 2022-04-15 NOTE — Telephone Encounter (Signed)
Patient has a lab appt 04/20/2022, there are no orders in. 

## 2022-04-15 NOTE — Telephone Encounter (Signed)
Orders placed.

## 2022-04-19 ENCOUNTER — Encounter: Payer: Self-pay | Admitting: Internal Medicine

## 2022-04-19 NOTE — Assessment & Plan Note (Signed)
Documented elevation.  Check met b and A1c. Low carb diet and exercise.

## 2022-04-19 NOTE — Assessment & Plan Note (Signed)
No significant acid reflux reported.  Occasional heartburn.  Controls with controlling time eating.  Follow.

## 2022-04-19 NOTE — Assessment & Plan Note (Signed)
Avoid antiinflammatories.  Continue losartan.  Follow metabolic panel.

## 2022-04-19 NOTE — Assessment & Plan Note (Signed)
carotid artery stenosis status post right carotid endarterectomy. Continue risk factor modification.  Continues on crestor and zetia. Continue aspirin.

## 2022-04-19 NOTE — Assessment & Plan Note (Signed)
Quit smoking 6 years ago.  States smoked for 45 years.

## 2022-04-19 NOTE — Assessment & Plan Note (Signed)
Has seen Dr Halford Chessman.  Has albuterol to take prn.  Breathing stable.

## 2022-04-19 NOTE — Assessment & Plan Note (Signed)
Low cholesterol diet and exercise.  On crestor and zetia.  Follow lipid panel and liver function tests.

## 2022-04-19 NOTE — Assessment & Plan Note (Signed)
Colonoscopy 06/2017 - Three 2 to 4 mm polyps in the descending colon. One 3 mm polyp in the sigmoid colon.  Recommended f/u 5 years.  Colonoscopy scheduled for 06/2022.

## 2022-04-19 NOTE — Assessment & Plan Note (Signed)
Continue losartan.  Follow pressures.  Follow metabolic panel.  

## 2022-04-19 NOTE — Assessment & Plan Note (Signed)
History of coronary artery disease with chronic total occlusion of the mid RCA managed medically.  Sees Dr End.  Continue aspirin, crestor and zetia.  Currently no chest pain or sob.  Getting back into exercising.

## 2022-04-20 ENCOUNTER — Other Ambulatory Visit (INDEPENDENT_AMBULATORY_CARE_PROVIDER_SITE_OTHER): Payer: BC Managed Care – PPO

## 2022-04-20 DIAGNOSIS — E785 Hyperlipidemia, unspecified: Secondary | ICD-10-CM | POA: Diagnosis not present

## 2022-04-20 DIAGNOSIS — R739 Hyperglycemia, unspecified: Secondary | ICD-10-CM | POA: Diagnosis not present

## 2022-04-20 DIAGNOSIS — I10 Essential (primary) hypertension: Secondary | ICD-10-CM | POA: Diagnosis not present

## 2022-04-20 LAB — BASIC METABOLIC PANEL
BUN: 21 mg/dL (ref 6–23)
CO2: 26 mEq/L (ref 19–32)
Calcium: 8.9 mg/dL (ref 8.4–10.5)
Chloride: 101 mEq/L (ref 96–112)
Creatinine, Ser: 1.29 mg/dL (ref 0.40–1.50)
GFR: 57.19 mL/min — ABNORMAL LOW (ref >60.00)
Glucose, Bld: 95 mg/dL (ref 70–99)
Potassium: 4.6 mEq/L (ref 3.5–5.1)
Sodium: 133 mEq/L — ABNORMAL LOW (ref 135–145)

## 2022-04-20 LAB — HEMOGLOBIN A1C: Hgb A1c MFr Bld: 6 % (ref 4.6–6.5)

## 2022-04-20 LAB — LIPID PANEL
Cholesterol: 118 mg/dL (ref 0–200)
HDL: 50.9 mg/dL (ref >39.00)
LDL Cholesterol: 54 mg/dL (ref 0–99)
NonHDL: 67.06
Total CHOL/HDL Ratio: 2
Triglycerides: 66 mg/dL (ref 0.0–149.0)
VLDL: 13.2 mg/dL (ref 0.0–40.0)

## 2022-04-20 LAB — HEPATIC FUNCTION PANEL
ALT: 21 U/L (ref 0–53)
AST: 26 U/L (ref 0–37)
Albumin: 4 g/dL (ref 3.5–5.2)
Alkaline Phosphatase: 54 U/L (ref 39–117)
Bilirubin, Direct: 0.1 mg/dL (ref 0.0–0.3)
Total Bilirubin: 0.7 mg/dL (ref 0.2–1.2)
Total Protein: 6.7 g/dL (ref 6.0–8.3)

## 2022-04-21 ENCOUNTER — Telehealth: Payer: Self-pay

## 2022-04-21 ENCOUNTER — Other Ambulatory Visit: Payer: Self-pay

## 2022-04-21 DIAGNOSIS — E871 Hypo-osmolality and hyponatremia: Secondary | ICD-10-CM

## 2022-04-21 NOTE — Telephone Encounter (Signed)
LM FOR PT TO CB RE:   Notify Christopher Villegas that his sodium is slightly decreased.  Kidney function improved from last check.  Overall sugar control is elevated above normal.  Recommend low carb diet and exercise.  We will follow.  Cholesterol levels look good.  Liver function tests are wnl.  Recheck sodium in the next 1-2 weeks.

## 2022-05-03 ENCOUNTER — Other Ambulatory Visit (INDEPENDENT_AMBULATORY_CARE_PROVIDER_SITE_OTHER): Payer: BC Managed Care – PPO

## 2022-05-03 DIAGNOSIS — E871 Hypo-osmolality and hyponatremia: Secondary | ICD-10-CM

## 2022-05-03 LAB — SODIUM: Sodium: 136 mEq/L (ref 135–145)

## 2022-05-05 ENCOUNTER — Ambulatory Visit
Admission: RE | Admit: 2022-05-05 | Discharge: 2022-05-05 | Disposition: A | Discharge status: Home / Self Care | Payer: BC Managed Care – PPO | Source: Ambulatory Visit | Attending: Acute Care | Admitting: Acute Care

## 2022-05-05 DIAGNOSIS — Z87891 Personal history of nicotine dependence: Secondary | ICD-10-CM | POA: Insufficient documentation

## 2022-05-10 ENCOUNTER — Other Ambulatory Visit: Payer: Self-pay | Admitting: Acute Care

## 2022-05-10 DIAGNOSIS — Z122 Encounter for screening for malignant neoplasm of respiratory organs: Secondary | ICD-10-CM

## 2022-05-10 DIAGNOSIS — Z87891 Personal history of nicotine dependence: Secondary | ICD-10-CM

## 2022-05-20 ENCOUNTER — Other Ambulatory Visit: Payer: Self-pay | Admitting: Internal Medicine

## 2022-05-25 ENCOUNTER — Other Ambulatory Visit: Payer: Self-pay | Admitting: Internal Medicine

## 2022-06-10 ENCOUNTER — Encounter (HOSPITAL_BASED_OUTPATIENT_CLINIC_OR_DEPARTMENT_OTHER): Payer: Self-pay | Admitting: Pulmonary Disease

## 2022-06-10 ENCOUNTER — Ambulatory Visit (INDEPENDENT_AMBULATORY_CARE_PROVIDER_SITE_OTHER): Payer: BC Managed Care – PPO | Admitting: Pulmonary Disease

## 2022-06-10 VITALS — BP 118/74 | HR 65 | Temp 98.2°F | Ht 69.0 in | Wt 215.2 lb

## 2022-06-10 DIAGNOSIS — J4540 Moderate persistent asthma, uncomplicated: Secondary | ICD-10-CM

## 2022-06-10 DIAGNOSIS — R0609 Other forms of dyspnea: Secondary | ICD-10-CM

## 2022-06-10 MED ORDER — FLUTICASONE FUROATE-VILANTEROL 100-25 MCG/ACT IN AEPB: 1.0000 | INHALATION_SPRAY | Freq: Every day | RESPIRATORY_TRACT | 5 refills | Status: DC

## 2022-06-10 NOTE — Patient Instructions (Signed)
Breo one puff daily, and rinse your mouth after each use.  Stop using arnuity once you get breo.  Follow up in 4 months.

## 2022-06-10 NOTE — Progress Notes (Signed)
[facility]  Chief Complaint  Patient presents with   Follow-up    Pt states his inhaler Arnuity is not helping as much as it should. The inhaler did help the wheezing.     Constitutional:  BP 118/74 (BP Location: Left Arm, Patient Position: Sitting, Cuff Size: Large)   Pulse 65   Temp 98.2 F (36.8 C) (Oral)   Ht '5\' 9"'$  (1.753 m)   Wt 215 lb 3.2 oz (97.6 kg)   SpO2 99%   BMI 31.78 kg/m   Past Medical History:  CAD, GERD, HTN, HLD  Past Surgical History:  He  has a past surgical history that includes Tonsillectomy and adenoidectomy; Cyst excision perineal; Carotid endarterectomy (Right, 2009); Colonoscopy with propofol (N/A, 06/16/2017); Esophagogastroduodenoscopy (egd) with propofol (N/A, 06/16/2017); polypectomy (06/16/2017); LEFT HEART CATH AND CORONARY ANGIOGRAPHY (N/A, 07/01/2017); and Biceps tendon repair (2021).  Brief Summary:  Christopher Villegas is a 66 y.o. male former smoker with asthma and emphysema.      Subjective:   He tried stopping singulair and arnuity.  He called the office in May to get arnuity restarted because he was having more wheeze and dyspnea with exercise.  He feels arnuity has helped his wheezing.  He still gets winded with certain activity (carrying equipment up stairs at work).  He recovers after resting a few minutes.  He used to ride 20 to 30 miles per day on his bike, but hasn't been able to do this over the past year due to orthopedic issues.  His weight is up about 20 lbs in the last 1 year.  Physical Exam:   Appearance - well kempt   ENMT - no sinus tenderness, no oral exudate, no LAN, Mallampati 3 airway, no stridor  Respiratory - equal breath sounds bilaterally, no wheezing or rales  CV - s1s2 regular rate and rhythm, no murmurs  Ext - no clubbing, no edema  Skin - no rashes  Psych - normal mood and affect     Pulmonary testing:  PFT 02/09/21 >> FEV1 3.22 (102%), FEV1% 74, TLC  7.77 (117%), RV 3.60 (159%), DLCO 89%, +BD  Chest Imaging:  LDCT chest 04/29/20 >> small nodules up to 3.7 mm, mild bronchial wall thickening, mild centrilobular and paraseptal emphysema LDCT 05/07/21 >> mild emphysema, b/l nodules up to 3.5 mm LDCT chest 05/07/22 >> no change  Cardiac testing:  [facility] 07/01/17 >> severe single vessel CAD with chronic total occlusion of RCA, mild/mod CAD LMCA/LAD/LCx  Social History:  He  reports that he quit smoking about 7 years ago. His smoking use included cigarettes. He has a 67.50 pack-year smoking history. He has been exposed to tobacco smoke. He has never used smokeless tobacco. He reports that he does not drink alcohol and does not use drugs.  Family History:  His family history includes Alcohol abuse in his father; Arthritis in his mother; Autoimmune disease in his sister; COPD in his father; Cancer in his mother; Healthy in his son and son; Heart disease in his father; Hyperlipidemia in his mother; Hypertension in his father and mother; Kidney failure in his father; Lupus in his sister.     Assessment/Plan:   Moderate persistent asthma, emphysema. - leg cramps from breztri and dulera - has progressive symptoms - will have him try breo in place of arnuity - continue prn albuterol - if he develops leg cramps again, then could try on higher dose ICS alone or in combination with  restarting singulair  History of tobacco abuse. - he will get follow up LDCT chest in November 2024  Exercised induced asthma. - discussed doing high intensity warm up and using albuterol before exercise as needed, but should improve with dulera therapy  Dyspnea on exertion. - likely combination of asthma, weight gain, and relative deconditioning - advised him to start a gradual exercise regimen  Coronary artery disease. - followed by Dr. Harrell Gave End with cardiology  Time Spent Involved in Patient Care on Day of Examination:  35 minutes  Follow up:    Patient Instructions  Breo one puff daily, and rinse your mouth after each use.  Stop using arnuity once you get breo.  Follow up in 4 months.  Medication List:   Allergies as of 06/10/2022       Reactions   Plavix [clopidogrel] Rash        Medication List        Accurate as of June 10, 2022 10:16 AM. If you have any questions, ask your nurse or doctor.          STOP taking these medications    Arnuity Ellipta 100 MCG/ACT Aepb Generic drug: Fluticasone Furoate Stopped by: Chesley Mires, MD       TAKE these medications    albuterol 108 (90 Base) MCG/ACT inhaler Commonly known as: VENTOLIN HFA INHALE 1 PUFF INTO THE LUNGS EVERY MORNING   aspirin EC 81 MG tablet Take 81 mg by mouth every other day. Swallow whole.   ezetimibe 10 MG tablet Commonly known as: ZETIA TAKE 1 TABLET BY MOUTH DAILY   fluticasone furoate-vilanterol 100-25 MCG/ACT Aepb Commonly known as: Breo Ellipta Inhale 1 puff into the lungs daily. Started by: Chesley Mires, MD   isosorbide mononitrate 30 MG 24 hr tablet Commonly known as: IMDUR Take 1 tablet (30 mg total) by mouth daily.   losartan 50 MG tablet Commonly known as: COZAAR TAKE 1 TABLET BY MOUTH DAILY   metoprolol tartrate 25 MG tablet Commonly known as: LOPRESSOR TAKE ONE (1) TABLET BY MOUTH TWO TIMES PER DAY   rosuvastatin 10 MG tablet Commonly known as: CRESTOR TAKE 1 TABLET BY MOUTH DAILY        Signature:  Chesley Mires, MD [place] Pager - [phone] 06/10/2022, 10:16 AM

## 2022-06-15 ENCOUNTER — Other Ambulatory Visit: Payer: Self-pay

## 2022-06-15 ENCOUNTER — Telehealth: Payer: Self-pay

## 2022-06-15 DIAGNOSIS — M9903 Segmental and somatic dysfunction of lumbar region: Secondary | ICD-10-CM | POA: Diagnosis not present

## 2022-06-15 DIAGNOSIS — M9901 Segmental and somatic dysfunction of cervical region: Secondary | ICD-10-CM | POA: Diagnosis not present

## 2022-06-15 DIAGNOSIS — M461 Sacroiliitis, not elsewhere classified: Secondary | ICD-10-CM | POA: Diagnosis not present

## 2022-06-15 DIAGNOSIS — M9904 Segmental and somatic dysfunction of sacral region: Secondary | ICD-10-CM | POA: Diagnosis not present

## 2022-06-15 DIAGNOSIS — M542 Cervicalgia: Secondary | ICD-10-CM | POA: Diagnosis not present

## 2022-06-15 DIAGNOSIS — M5451 Vertebrogenic low back pain: Secondary | ICD-10-CM | POA: Diagnosis not present

## 2022-06-15 MED ORDER — NA SULFATE-K SULFATE-MG SULF 17.5-3.13-1.6 GM/177ML PO SOLN: 1.0000 | Freq: Once | ORAL | 0 refills | Status: AC

## 2022-06-15 NOTE — Telephone Encounter (Signed)
Patient contacted office to request his rx to be sent to pharmacy for next weeks scheduled colonoscopy.  He also has new insurance as follows-[employer] Medicare PPO  ID # [id number]  This information will be routed to [facility] for precert.  Thanks,  [place], [place]

## 2022-06-21 ENCOUNTER — Telehealth: Payer: Self-pay | Admitting: *Deleted

## 2022-06-21 MED ORDER — PEG 3350-KCL-NABCB-NACL-NASULF 236 G PO SOLR: 4000.0000 mL | Freq: Once | ORAL | 0 refills | Status: AC

## 2022-06-21 NOTE — Telephone Encounter (Signed)
Patient left message, actually he called several times including last week.   Voicemail was left. Suprep was sent to patient's pharmacy. However, patient stated that [employer] told him that his Rx was sent wrong, he was given 365 days supply.  I have called [facility] pharmacy to check what is going with the Rx. The pharmacist stated that he is not sure why, could be deductible or something else, the insurance is only covering $0.34 which does not make sense. After speaking with the pharmacist, I have sent in Rx for Golytely, this would be cheaper out of pocket.  Patient called office again. Explain to patient regarding about I have discuss with pharmacy. He wanted to call [facility] again regarding the Suprep which he prefer to get.  Either way, both Rx are at [facility] for patient.

## 2022-06-21 NOTE — Addendum Note (Signed)
Addended by: Jacqualin Combes on: 06/21/2022 08:44 AM   Modules accepted: Orders

## 2022-06-22 ENCOUNTER — Encounter
Admission: RE | Disposition: A | Discharge status: Home / Self Care | Payer: Self-pay | Source: Home / Self Care | Attending: Gastroenterology

## 2022-06-22 ENCOUNTER — Ambulatory Visit
Admission: RE | Admit: 2022-06-22 | Discharge: 2022-06-22 | Disposition: A | Discharge status: Home / Self Care | Payer: Medicare HMO | Attending: Gastroenterology | Admitting: Gastroenterology

## 2022-06-22 ENCOUNTER — Encounter: Payer: Self-pay | Admitting: Gastroenterology

## 2022-06-22 ENCOUNTER — Ambulatory Visit: Payer: Medicare HMO | Admitting: Anesthesiology

## 2022-06-22 DIAGNOSIS — I10 Essential (primary) hypertension: Secondary | ICD-10-CM | POA: Diagnosis not present

## 2022-06-22 DIAGNOSIS — K635 Polyp of colon: Secondary | ICD-10-CM | POA: Diagnosis not present

## 2022-06-22 DIAGNOSIS — Z87891 Personal history of nicotine dependence: Secondary | ICD-10-CM | POA: Insufficient documentation

## 2022-06-22 DIAGNOSIS — Z1211 Encounter for screening for malignant neoplasm of colon: Secondary | ICD-10-CM | POA: Diagnosis not present

## 2022-06-22 DIAGNOSIS — J45909 Unspecified asthma, uncomplicated: Secondary | ICD-10-CM | POA: Insufficient documentation

## 2022-06-22 DIAGNOSIS — I25119 Atherosclerotic heart disease of native coronary artery with unspecified angina pectoris: Secondary | ICD-10-CM | POA: Diagnosis not present

## 2022-06-22 DIAGNOSIS — I129 Hypertensive chronic kidney disease with stage 1 through stage 4 chronic kidney disease, or unspecified chronic kidney disease: Secondary | ICD-10-CM | POA: Diagnosis not present

## 2022-06-22 DIAGNOSIS — K5730 Diverticulosis of large intestine without perforation or abscess without bleeding: Secondary | ICD-10-CM | POA: Insufficient documentation

## 2022-06-22 DIAGNOSIS — I2510 Atherosclerotic heart disease of native coronary artery without angina pectoris: Secondary | ICD-10-CM | POA: Insufficient documentation

## 2022-06-22 DIAGNOSIS — N183 Chronic kidney disease, stage 3 unspecified: Secondary | ICD-10-CM | POA: Diagnosis not present

## 2022-06-22 DIAGNOSIS — D126 Benign neoplasm of colon, unspecified: Secondary | ICD-10-CM | POA: Diagnosis not present

## 2022-06-22 DIAGNOSIS — Z86010 Personal history of colonic polyps: Secondary | ICD-10-CM

## 2022-06-22 HISTORY — PX: COLONOSCOPY WITH PROPOFOL: SHX5780

## 2022-06-22 SURGERY — COLONOSCOPY WITH PROPOFOL
Anesthesia: General

## 2022-06-22 MED ORDER — PROPOFOL 500 MG/50ML IV EMUL
INTRAVENOUS | Status: DC | PRN
  Administered 2022-06-22: 193.548 ug/kg/min via INTRAVENOUS
  Administered 2022-06-22: 169.355 ug/kg/min via INTRAVENOUS

## 2022-06-22 MED ORDER — PROPOFOL 10 MG/ML IV BOLUS
INTRAVENOUS | Status: DC | PRN
  Administered 2022-06-22: 40 mg via INTRAVENOUS
  Administered 2022-06-22: 90 mg via INTRAVENOUS

## 2022-06-22 MED ORDER — PHENYLEPHRINE 80 MCG/ML (10ML) SYRINGE FOR IV PUSH (FOR BLOOD PRESSURE SUPPORT)
PREFILLED_SYRINGE | INTRAVENOUS | Status: AC
  Filled 2022-06-22: qty 10

## 2022-06-22 MED ORDER — PROPOFOL 1000 MG/100ML IV EMUL
INTRAVENOUS | Status: AC
  Filled 2022-06-22: qty 100

## 2022-06-22 MED ORDER — EPHEDRINE 5 MG/ML INJ
INTRAVENOUS | Status: AC
  Filled 2022-06-22: qty 5

## 2022-06-22 MED ORDER — LIDOCAINE HCL (CARDIAC) PF 100 MG/5ML IV SOSY
PREFILLED_SYRINGE | INTRAVENOUS | Status: DC | PRN
  Administered 2022-06-22: 100 mg via INTRAVENOUS

## 2022-06-22 MED ORDER — SODIUM CHLORIDE 0.9 % IV SOLN: INTRAVENOUS | Status: DC

## 2022-06-22 NOTE — Anesthesia Preprocedure Evaluation (Signed)
Anesthesia Evaluation  Patient identified by MRN, date of birth, ID band Patient awake    Reviewed: Allergy & Precautions, NPO status , Patient's Chart, lab work & pertinent test results  History of Anesthesia Complications Negative for: history of anesthetic complications  Airway Mallampati: III  TM Distance: <3 FB Neck ROM: full    Dental  (+) Chipped   Pulmonary neg shortness of breath, asthma , former smoker   Pulmonary exam normal        Cardiovascular Exercise Tolerance: Good hypertension, (-) angina + CAD  (-) DOE Normal cardiovascular exam     Neuro/Psych  Neuromuscular disease  negative psych ROS   GI/Hepatic Neg liver ROS,GERD  Controlled,,  Endo/Other  negative endocrine ROS    Renal/GU Renal disease  negative genitourinary   Musculoskeletal   Abdominal   Peds  Hematology negative hematology ROS (+)   Anesthesia Other Findings Past Medical History: No date: Asthma No date: CAD (coronary artery disease)     Comment:  a. 06/2017 Cath: LM 30ost/d, LAD 82m D1/2 large, nl, D3               small, LCX 20ost, 40p, OM1/2/3 nl, RCA 40/1090m CTO w/               L->R collats. RPDA fills via collats from 1st Septal. EF               55-65%-->Med rx (could consider CTO PCI for angina). No [facility]    Comment:  Basal Cell carcinoma No date: Carotid arterial disease ([facility]    Comment:  a. 2009 s/p R CEA; b. 05/2018 U/S: RICA 1-39%, RECA               >5>94%LICA 06-21-63%LECA <5<46%No date: GERD (gastroesophageal reflux disease) No date: History of colon polyps No date: History of echocardiogram     Comment:  a. 10/2015 Echo: EF 55%. Mild TR/MR. No date: Hyperlipidemia No date: Hypertension  Past Surgical History: 2021: BICEPS TENDON REPAIR 2009: CAROTID ENDARTERECTOMY; Right 06/16/2017: COLONOSCOPY WITH PROPOFOL; N/A     Comment:  Procedure: COLONOSCOPY WITH PROPOFOL;  Surgeon: WoLucilla LameMD;  [facility]               [facility]oscopy;  Laterality: N/A; No date: CYST EXCISION PERINEAL 06/16/2017: ESOPHAGOGASTRODUODENOSCOPY (EGD) WITH PROPOFOL; N/A     Comment:  Procedure: ESOPHAGOGASTRODUODENOSCOPY (EGD) WITH               PROPOFOL;  Surgeon: WoLucilla LameMD;  [place] [facility]oscopy;  Laterality: N/A; 07/01/2017: LEFT HEART CATH AND CORONARY ANGIOGRAPHY; N/A     Comment:  Procedure: LEFT HEART CATH AND CORONARY ANGIOGRAPHY;                Surgeon: EnNelva BushMD;  [facility];  Service: Cardiovascular;  Laterality: N/A; 06/16/2017: POLYPECTOMY     Comment:  Procedure: POLYPECTOMY;  Surgeon: WoLucilla LameMD;                [place] [facility]: Endoscopy;; No date: TONSILLECTOMY AND ADENOIDECTOMY  BMI    Body Mass Index:  30.27 kg/m      Reproductive/Obstetrics negative OB ROS                             Anesthesia Physical Anesthesia Plan  ASA: 3  Anesthesia Plan: General   Post-op Pain Management:    Induction: Intravenous  PONV Risk Score and Plan: Propofol infusion and TIVA  Airway Management Planned: Natural Airway and Nasal Cannula  Additional Equipment:   Intra-op Plan:   Post-operative Plan:   Informed Consent: I have reviewed the patients History and Physical, chart, labs and discussed the procedure including the risks, benefits and alternatives for the proposed anesthesia with the patient or authorized representative who has indicated his/her understanding and acceptance.     Dental Advisory Given  Plan Discussed with: Anesthesiologist, CRNA and Surgeon  Anesthesia Plan Comments: (Patient consented for risks of anesthesia including but not limited to:  - adverse reactions to medications - risk of airway placement if required - damage to eyes, teeth, lips or other oral mucosa - nerve  damage due to positioning  - sore throat or hoarseness - Damage to heart, brain, nerves, lungs, other parts of body or loss of life  Patient voiced understanding.)       Anesthesia Quick Evaluation

## 2022-06-22 NOTE — Anesthesia Postprocedure Evaluation (Signed)
Anesthesia Post Note  Patient: Christopher Villegas  Procedure(s) Performed: COLONOSCOPY WITH PROPOFOL  Patient location during evaluation: Endoscopy Anesthesia Type: General Level of consciousness: awake and alert Pain management: pain level controlled Vital Signs Assessment: post-procedure vital signs reviewed and stable Respiratory status: spontaneous breathing, nonlabored ventilation, respiratory function stable and patient connected to nasal cannula oxygen Cardiovascular status: blood pressure returned to baseline and stable Postop Assessment: no apparent nausea or vomiting Anesthetic complications: no   No notable events documented.   Last Vitals:  Vitals:   06/22/22 1030 06/22/22 1036  BP: (!) 120/56 112/66  Pulse: 71 74  Resp: 16 15  Temp:    SpO2: 98% 98%    Last Pain:  Vitals:   06/22/22 1030  TempSrc:   PainSc: 0-No pain                 Precious Haws Irvine Glorioso

## 2022-06-22 NOTE — Transfer of Care (Signed)
Immediate Anesthesia Transfer of Care Note  Patient: Christopher Villegas  Procedure(s) Performed: COLONOSCOPY WITH PROPOFOL  Patient Location: Endoscopy Unit  Anesthesia Type:General  Level of Consciousness: drowsy  Airway & Oxygen Therapy: Patient Spontanous Breathing  Post-op Assessment: Report given to RN and Post -op Vital signs reviewed and stable  Post vital signs: Reviewed and stable  Last Vitals:  Vitals Value Taken Time  BP 88/46 06/22/22 1010  Temp    Pulse 79 06/22/22 1009  Resp 17 06/22/22 1010  SpO2 96 % 06/22/22 1009  Vitals shown include unvalidated device data.  Last Pain:  Vitals:   06/22/22 0931  TempSrc: Temporal  PainSc: 0-No pain         Complications: No notable events documented.

## 2022-06-22 NOTE — Op Note (Addendum)
[facility] Gastroenterology Patient Name: Christopher Villegas Procedure Date: 06/22/2022 9:44 AM MRN: [id number] Account #: [id number] Date of Birth: November 28, 1955 Admit Type: Outpatient Age: 67 Room: [facility] ENDO ROOM 4 Gender: Male Note Status: Finalized Instrument Name: Park Meo [id number] Procedure:             Colonoscopy Indications:           High risk colon cancer surveillance: Personal history                         of colonic polyps Providers:             Lucilla Lame MD, MD Referring MD:          Einar Pheasant, MD (Referring MD) Medicines:             Propofol per Anesthesia Complications:         No immediate complications. Procedure:             Pre-Anesthesia Assessment:                        - Prior to the procedure, a History and Physical was                         performed, and patient medications and allergies were                         reviewed. The patient's tolerance of previous                         anesthesia was also reviewed. The risks and benefits                         of the procedure and the sedation options and risks                         were discussed with the patient. All questions were                         answered, and informed consent was obtained. Prior                         Anticoagulants: The patient has taken no anticoagulant                         or antiplatelet agents. ASA Grade Assessment: II - A                         patient with mild systemic disease. After reviewing                         the risks and benefits, the patient was deemed in                         satisfactory condition to undergo the procedure.                        After obtaining informed consent, the colonoscope was  passed under direct vision. Throughout the procedure,                         the patient's blood pressure, pulse, and oxygen                         saturations were monitored continuously. The                          Colonoscope was introduced through the anus and                         advanced to the the cecum, identified by appendiceal                         orifice and ileocecal valve. The colonoscopy was                         performed without difficulty. The patient tolerated                         the procedure well. The quality of the bowel                         preparation was good. Findings:      The perianal and digital rectal examinations were normal.      A 2 mm polyp was found in the transverse colon. The polyp was sessile.       The polyp was removed with a cold biopsy forceps. Resection and       retrieval were complete.      Multiple small-mouthed diverticula were found in the sigmoid colon. Impression:            - One 2 mm polyp in the transverse colon, removed with                         a cold biopsy forceps. Resected and retrieved.                        - Diverticulosis in the sigmoid colon. Recommendation:        - Discharge patient to home.                        - Resume previous diet.                        - Continue present medications.                        - Repeat colonoscopy in 7 years for surveillance. Procedure Code(s):     --- Professional ---                        [id number], Colonoscopy, flexible; with biopsy, single or                         multiple Diagnosis Code(s):     --- Professional ---  Z86.010, Personal history of colonic polyps                        D12.3, Benign neoplasm of transverse colon (hepatic                         flexure or splenic flexure) CPT copyright 2022 American Medical Association. All rights reserved. The codes documented in this report are preliminary and upon coder review may  be revised to meet current compliance requirements. Lucilla Lame MD, MD 06/22/2022 10:08:14 AM This report has been signed electronically. Number of Addenda: 0 Note Initiated On: 06/22/2022 9:44 AM Scope Withdrawal  Time: 0 hours 6 minutes 22 seconds  Total Procedure Duration: 0 hours 9 minutes 38 seconds  Estimated Blood Loss:  Estimated blood loss: none.      [facility]

## 2022-06-22 NOTE — H&P (Signed)
Lucilla Lame, MD [address], [place] [address] Phone:[phone] Fax : [phone]  Primary Care Physician:  Einar Pheasant, MD Primary Gastroenterologist:  Dr. Allen Norris  Pre-Procedure History & Physical: HPI:  Christopher Villegas is a 67 y.o. male is here for an colonoscopy.   Past Medical History:  Diagnosis Date   Asthma    CAD (coronary artery disease)    a. 06/2017 Cath: LM 30ost/d, LAD 32m D1/2 large, nl, D3 small, LCX 20ost, 40p, OM1/2/3 nl, RCA 40/1064m CTO w/ L->R collats. RPDA fills via collats from 1st Septal. EF 55-65%-->Med rx (could consider CTO PCI for angina).   [facility]   Basal Cell carcinoma   [place]   a. 2009 s/p R CEA; b. 05/2018 U/S: RICA 06-21-22%RECA >5>23%LICA 06-18-34%LECA <5<14%  GERD (gastroesophageal reflux disease)    History of colon polyps    History of echocardiogram    a. 10/2015 Echo: EF 55%. Mild TR/MR.   Hyperlipidemia    Hypertension     Past Surgical History:  Procedure Laterality Date   BICEPS TENDON REPAIR  2021   CAROTID ENDARTERECTOMY Right 2009   COLONOSCOPY WITH PROPOFOL N/A 06/16/2017   Procedure: COLONOSCOPY WITH PROPOFOL;  Surgeon: WoLucilla LameMD;  [place];  Laterality: N/A;   CYST EXCISION PERINEAL     ESOPHAGOGASTRODUODENOSCOPY (EGD) WITH PROPOFOL N/A 06/16/2017   Procedure: ESOPHAGOGASTRODUODENOSCOPY (EGD) WITH PROPOFOL;  Surgeon: WoLucilla LameMD;  [place];  Laterality: N/A;   LEFT HEART CATH AND CORONARY ANGIOGRAPHY N/A 07/01/2017   Procedure: LEFT HEART CATH AND CORONARY ANGIOGRAPHY;  Surgeon: EnNelva BushMD;  [place] [facility];  Service: Cardiovascular;  Laterality: N/A;   POLYPECTOMY  06/16/2017   Procedure: POLYPECTOMY;  Surgeon: WoLucilla LameMD;  [place] [facility];;   TONSILLECTOMY AND ADENOIDECTOMY      Prior to Admission  medications   Medication Sig Start Date End Date Taking? Authorizing Provider  ezetimibe (ZETIA) 10 MG tablet TAKE 1 TABLET BY MOUTH DAILY 02/25/22  Yes GiJerrol Banana MD  fluticasone furoate-vilanterol (BREO ELLIPTA) 100-25 MCG/ACT AEPB Inhale 1 puff into the lungs daily. 06/10/22  Yes SoChesley MiresMD  isosorbide mononitrate (IMDUR) 30 MG 24 hr tablet Take 1 tablet (30 mg total) by mouth daily. 05/25/22  Yes End, ChHarrell GaveMD  losartan (COZAAR) 50 MG tablet TAKE 1 TABLET BY MOUTH DAILY 02/02/22  Yes GiJerrol Banana MD  metoprolol tartrate (LOPRESSOR) 25 MG tablet TAKE ONE (1) TABLET BY MOUTH TWO TIMES PER DAY 09/23/21  Yes End, ChHarrell GaveMD  rosuvastatin (CRESTOR) 10 MG tablet TAKE 1 TABLET BY MOUTH DAILY 05/20/22  Yes End, ChHarrell GaveMD  albuterol (VENTOLIN HFA) 108 (90 Base) MCG/ACT inhaler INHALE 1 PUFF INTO THE LUNGS EVERY MORNING 11/19/21   GiJerrol Banana MD  aspirin EC 81 MG tablet Take 81 mg by mouth every other day. Swallow whole.    [provider]    Allergies as of 01/15/2022 - Review Complete 12/02/2021  Allergen Reaction Noted   Plavix [clopidogrel] Rash 10/18/2014    Family History  Problem Relation Age of Onset   Cancer Mother        breast   Hypertension Mother    Hyperlipidemia Mother    Arthritis Mother        osteo   Hypertension  Father    Heart disease Father        CHF   Kidney failure Father        causing death   COPD Father    Alcohol abuse Father    Autoimmune disease Sister    Lupus Sister    Healthy Son    Healthy Son     Social History   Socioeconomic History   Marital status: Married    Spouse name: Beth   Number of children: 2   Years of education: 16   Highest education level: Not on file  Occupational History   Occupation: [employer]  Tobacco Use   Smoking status: Former    Packs/day: 1.50    Years: 45.00    Total pack years: 67.50    Types: Cigarettes    Quit date:  09/29/2014    Years since quitting: 7.7    Passive exposure: Past   Smokeless tobacco: Never  Vaping Use   Vaping Use: Never used  Substance and Sexual Activity   Alcohol use: No    Comment: Former Alcoholic - sober since 03/16/5851   Drug use: No   Sexual activity: Yes  Other Topics Concern   Not on file  Social History Narrative   Not on file   Social Determinants of Health   Financial Resource Strain: Not on file  Food Insecurity: Not on file  Transportation Needs: Not on file  Physical Activity: Not on file  Stress: Not on file  Social Connections: Not on file  Intimate Partner Violence: Not on file    Review of Systems: See HPI, otherwise negative ROS  Physical Exam: BP 136/79   Pulse 78   Temp (!) 97.1 F (36.2 C) (Temporal)   Resp 18   Ht '5\' 9"'$  (1.753 m)   Wt 93 kg   SpO2 97%   BMI 30.27 kg/m  General:   Alert,  pleasant and cooperative in NAD Head:  Normocephalic and atraumatic. Neck:  Supple; no masses or thyromegaly. Lungs:  Clear throughout to auscultation.    Heart:  Regular rate and rhythm. Abdomen:  Soft, nontender and nondistended. Normal bowel sounds, without guarding, and without rebound.   Neurologic:  Alert and  oriented x4;  grossly normal neurologically.  Impression/Plan: Christopher Villegas is here for an colonoscopy to be performed for a history of adenomatous polyps on 2018   Risks, benefits, limitations, and alternatives regarding  colonoscopy have been reviewed with the patient.  Questions have been answered.  All parties agreeable.   Lucilla Lame, MD  06/22/2022, 9:44 AM

## 2022-06-23 ENCOUNTER — Encounter: Payer: Self-pay | Admitting: Gastroenterology

## 2022-06-23 ENCOUNTER — Encounter: Payer: Self-pay | Admitting: Internal Medicine

## 2022-06-23 DIAGNOSIS — M5451 Vertebrogenic low back pain: Secondary | ICD-10-CM | POA: Diagnosis not present

## 2022-06-23 DIAGNOSIS — Z0000 Encounter for general adult medical examination without abnormal findings: Secondary | ICD-10-CM | POA: Insufficient documentation

## 2022-06-23 DIAGNOSIS — M9903 Segmental and somatic dysfunction of lumbar region: Secondary | ICD-10-CM | POA: Diagnosis not present

## 2022-06-23 DIAGNOSIS — M9904 Segmental and somatic dysfunction of sacral region: Secondary | ICD-10-CM | POA: Diagnosis not present

## 2022-06-23 DIAGNOSIS — M542 Cervicalgia: Secondary | ICD-10-CM | POA: Diagnosis not present

## 2022-06-23 DIAGNOSIS — M461 Sacroiliitis, not elsewhere classified: Secondary | ICD-10-CM | POA: Diagnosis not present

## 2022-06-23 DIAGNOSIS — M9901 Segmental and somatic dysfunction of cervical region: Secondary | ICD-10-CM | POA: Diagnosis not present

## 2022-06-23 LAB — SURGICAL PATHOLOGY

## 2022-06-24 DIAGNOSIS — M9903 Segmental and somatic dysfunction of lumbar region: Secondary | ICD-10-CM | POA: Diagnosis not present

## 2022-06-24 DIAGNOSIS — M542 Cervicalgia: Secondary | ICD-10-CM | POA: Diagnosis not present

## 2022-06-24 DIAGNOSIS — M461 Sacroiliitis, not elsewhere classified: Secondary | ICD-10-CM | POA: Diagnosis not present

## 2022-06-24 DIAGNOSIS — M9901 Segmental and somatic dysfunction of cervical region: Secondary | ICD-10-CM | POA: Diagnosis not present

## 2022-06-24 DIAGNOSIS — M9904 Segmental and somatic dysfunction of sacral region: Secondary | ICD-10-CM | POA: Diagnosis not present

## 2022-06-24 DIAGNOSIS — M5451 Vertebrogenic low back pain: Secondary | ICD-10-CM | POA: Diagnosis not present

## 2022-06-25 ENCOUNTER — Other Ambulatory Visit: Payer: Self-pay | Admitting: Internal Medicine

## 2022-06-25 DIAGNOSIS — M5412 Radiculopathy, cervical region: Secondary | ICD-10-CM | POA: Diagnosis not present

## 2022-06-30 DIAGNOSIS — M5412 Radiculopathy, cervical region: Secondary | ICD-10-CM | POA: Diagnosis not present

## 2022-07-02 DIAGNOSIS — M5412 Radiculopathy, cervical region: Secondary | ICD-10-CM | POA: Diagnosis not present

## 2022-07-02 DIAGNOSIS — M542 Cervicalgia: Secondary | ICD-10-CM | POA: Diagnosis not present

## 2022-07-02 DIAGNOSIS — M5451 Vertebrogenic low back pain: Secondary | ICD-10-CM | POA: Diagnosis not present

## 2022-07-02 DIAGNOSIS — M461 Sacroiliitis, not elsewhere classified: Secondary | ICD-10-CM | POA: Diagnosis not present

## 2022-07-02 DIAGNOSIS — M9904 Segmental and somatic dysfunction of sacral region: Secondary | ICD-10-CM | POA: Diagnosis not present

## 2022-07-02 DIAGNOSIS — M9901 Segmental and somatic dysfunction of cervical region: Secondary | ICD-10-CM | POA: Diagnosis not present

## 2022-07-02 DIAGNOSIS — M9903 Segmental and somatic dysfunction of lumbar region: Secondary | ICD-10-CM | POA: Diagnosis not present

## 2022-07-05 ENCOUNTER — Encounter: Payer: Self-pay | Admitting: Internal Medicine

## 2022-07-05 DIAGNOSIS — M542 Cervicalgia: Secondary | ICD-10-CM | POA: Insufficient documentation

## 2022-07-20 DIAGNOSIS — M9903 Segmental and somatic dysfunction of lumbar region: Secondary | ICD-10-CM | POA: Diagnosis not present

## 2022-07-20 DIAGNOSIS — M5451 Vertebrogenic low back pain: Secondary | ICD-10-CM | POA: Diagnosis not present

## 2022-07-20 DIAGNOSIS — M9904 Segmental and somatic dysfunction of sacral region: Secondary | ICD-10-CM | POA: Diagnosis not present

## 2022-07-20 DIAGNOSIS — M542 Cervicalgia: Secondary | ICD-10-CM | POA: Diagnosis not present

## 2022-07-20 DIAGNOSIS — M461 Sacroiliitis, not elsewhere classified: Secondary | ICD-10-CM | POA: Diagnosis not present

## 2022-07-20 DIAGNOSIS — M9901 Segmental and somatic dysfunction of cervical region: Secondary | ICD-10-CM | POA: Diagnosis not present

## 2022-07-27 ENCOUNTER — Other Ambulatory Visit: Payer: Self-pay | Admitting: Internal Medicine

## 2022-08-05 ENCOUNTER — Encounter: Payer: Self-pay | Admitting: Internal Medicine

## 2022-08-05 DIAGNOSIS — M5412 Radiculopathy, cervical region: Secondary | ICD-10-CM | POA: Diagnosis not present

## 2022-08-05 DIAGNOSIS — Z4789 Encounter for other orthopedic aftercare: Secondary | ICD-10-CM | POA: Diagnosis not present

## 2022-08-06 ENCOUNTER — Other Ambulatory Visit: Payer: Self-pay

## 2022-08-06 DIAGNOSIS — R200 Anesthesia of skin: Secondary | ICD-10-CM

## 2022-08-18 ENCOUNTER — Ambulatory Visit
Admission: RE | Admit: 2022-08-18 | Discharge: 2022-08-18 | Disposition: A | Discharge status: Home / Self Care | Payer: Self-pay | Source: Ambulatory Visit | Attending: Neurosurgery | Admitting: Neurosurgery

## 2022-08-18 ENCOUNTER — Other Ambulatory Visit: Payer: Self-pay

## 2022-08-18 DIAGNOSIS — Z049 Encounter for examination and observation for unspecified reason: Secondary | ICD-10-CM

## 2022-08-24 NOTE — Progress Notes (Unsigned)
Referring Physician:  Einar Pheasant, [id number],  [place] [address]  Primary Physician:  Einar Pheasant, MD  History of Present Illness: 08/26/2022 [name] is here today with a chief complaint of weakness in his right arm.  He has been having trouble with his grip.  He began having lancinating pain in January 2024.  This went down his right shoulder blade to his right arm and his third through fifth digits.  He has been having some trouble with grip strength.  His pain is gotten better, but he continues to have numbness in his fourth and fifth finger and grip weakness.  He is able to do most things he would like to do.  The numbness and tingling is bothering him significantly.  Changing positions make it better.  Coughing, sneezing, sitting, twisting make it worse.  He seen an outside provider who recommended surgical intervention.  He is here today for second opinion. Bowel/Bladder Dysfunction: none  Conservative measures: chiropractor Physical therapy:   has not participated Multimodal medical therapy including regular antiinflammatories:  ibuprofen, medrol dosepack, methocarbamol, tramadol Injections:  has not received epidural steroid injections  Past Surgery:  denies  [name] has no symptoms of cervical myelopathy.  The symptoms are causing a significant impact on the patient's life.   I have utilized the care everywhere function in epic to review the outside records available from external health systems.  Review of Systems:  A 10 point review of systems is negative, except for the pertinent positives and negatives detailed in the HPI.  Past Medical History: Past Medical History:  Diagnosis Date   Asthma    CAD (coronary artery disease)    a. 06/2017 Cath: LM 30ost/d, LAD 59m D1/2 large, nl, D3 small, LCX 20ost, 40p, OM1/2/3 nl, RCA 40/1067m CTO w/ L->R collats. RPDA fills via collats from 1st Septal. EF  55-65%-->Med rx (could consider CTO PCI for angina).   [facility]   Basal Cell carcinoma   [place] ([place]   a. 2009 s/p R CEA; b. 05/2018 U/S: RICA 1-123456RECA >[id number] 1-123456LECA <[id number]  GERD (gastroesophageal reflux disease)    History of colon polyps    History of echocardiogram    a. 10/2015 Echo: EF 55%. Mild TR/MR.   Hyperlipidemia    Hypertension     Past Surgical History: Past Surgical History:  Procedure Laterality Date   BICEPS TENDON REPAIR  2021   CAROTID ENDARTERECTOMY Right 2009   COLONOSCOPY WITH PROPOFOL N/A 06/16/2017   Procedure: COLONOSCOPY WITH PROPOFOL;  Surgeon: WoLucilla LameMD;  [place];  Laterality: N/A;   COLONOSCOPY WITH PROPOFOL N/A 06/22/2022   Procedure: COLONOSCOPY WITH PROPOFOL;  Surgeon: WoLucilla LameMD;  [facility];  Service: Endoscopy;  Laterality: N/A;   CYST EXCISION PERINEAL     ESOPHAGOGASTRODUODENOSCOPY (EGD) WITH PROPOFOL N/A 06/16/2017   Procedure: ESOPHAGOGASTRODUODENOSCOPY (EGD) WITH PROPOFOL;  Surgeon: WoLucilla LameMD;  [place];  Laterality: N/A;   LEFT HEART CATH AND CORONARY ANGIOGRAPHY N/A 07/01/2017   Procedure: LEFT HEART CATH AND CORONARY ANGIOGRAPHY;  Surgeon: EnNelva BushMD;  [place] [facility];  Service: Cardiovascular;  Laterality: N/A;   POLYPECTOMY  06/16/2017   Procedure: POLYPECTOMY;  Surgeon: WoLucilla LameMD;  [facility];  Service: Endoscopy;;   TONSILLECTOMY AND ADENOIDECTOMY      Allergies:  Allergies as of 08/26/2022 - Review Complete 08/26/2022  Allergen Reaction Noted   Plavix [clopidogrel] Rash 10/18/2014    Medications: No outpatient medications have been marked as taking for the 08/26/22 encounter (Office Visit) with Meade Maw, MD.    Social History: Social History   Tobacco Use   Smoking status: Former    Packs/day: 1.50    Years: 45.00     Additional pack years: 0.00    Total pack years: 67.50    Types: Cigarettes    Quit date: 09/29/2014    Years since quitting: 7.9    Passive exposure: Past   Smokeless tobacco: Never  Vaping Use   Vaping Use: Never used  Substance Use Topics   Alcohol use: No    Comment: Former Alcoholic - sober since [id number]   Drug use: No    Family Medical History: Family History  Problem Relation Age of Onset   Cancer Mother        breast   Hypertension Mother    Hyperlipidemia Mother    Arthritis Mother        osteo   Hypertension Father    Heart disease Father        CHF   Kidney failure Father        causing death   COPD Father    Alcohol abuse Father    Autoimmune disease Sister    Lupus Sister    Healthy Son    Healthy Son     Physical Examination: Vitals:   08/26/22 0851  BP: (!) 140/80  Pulse: 67  SpO2: 97%    General: Patient is well developed, well nourished, calm, collected, and in no apparent distress. Attention to examination is appropriate.  Neck:   Supple.  Full range of motion.  Respiratory: Patient is breathing without any difficulty.   NEUROLOGICAL:     Awake, alert, oriented to person, place, and time.  Speech is clear and fluent.   Cranial Nerves: Pupils equal round and reactive to light.  Facial tone is symmetric.  Facial sensation is symmetric. Shoulder shrug is symmetric. Tongue protrusion is midline.  There is no pronator drift.  ROM of spine: full.    Strength: Side Biceps Triceps Deltoid Interossei Grip Wrist Ext. Wrist Flex.  R '5 5 5 4 4 5 5  '$ L '5 5 5 5 5 5 5   '$ Side Iliopsoas Quads Hamstring PF DF EHL  R '5 5 5 5 5 5  '$ L '5 5 5 5 5 5   '$ Reflexes are 1+ and symmetric at the biceps, triceps, brachioradialis, patella and achilles.   Hoffman's is absent.   Bilateral upper and lower extremity sensation is intact to light touch.    No evidence of dysmetria noted.  Gait is normal.     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  There is moderate bilateral foraminal stenosis.  At C6-7, there is moderate to severe left-sided foraminal stenosis due to uncovertebral hypertrophy and disc herniation.  I have personally reviewed the images and agree with the above interpretation.  Assessment and Plan: Mr. Cockcroft is a pleasant 67 y.o. 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  His pain is much better.  He would like to try a short course of physical therapy.  I did offer him surgery and favored a right-sided C7-T1 foraminotomies with posterior discectomy.  I think a C7-T1 anterior approach would be very technically challenging.  While I feel this is indicated at this time, I also think it is reasonable to consider physical therapy for short course to see if he will continue to improve now that his pain is better.  I will see him back in 6 weeks after he tried physical therapy.    Thank you for involving me in the care of this patient.      Renelda Kilian K. Izora Ribas MD, [facility] Neurosurgery

## 2022-08-25 ENCOUNTER — Other Ambulatory Visit: Payer: Self-pay | Admitting: Internal Medicine

## 2022-08-26 ENCOUNTER — Encounter: Payer: Self-pay | Admitting: Neurosurgery

## 2022-08-26 ENCOUNTER — Ambulatory Visit: Payer: Medicare HMO | Admitting: Neurosurgery

## 2022-08-26 VITALS — BP 140/80 | HR 67 | Ht 69.0 in | Wt 205.0 lb

## 2022-08-26 DIAGNOSIS — M5412 Radiculopathy, cervical region: Secondary | ICD-10-CM | POA: Diagnosis not present

## 2022-08-27 ENCOUNTER — Encounter: Payer: Self-pay | Admitting: Internal Medicine

## 2022-08-27 DIAGNOSIS — M5020 Other cervical disc displacement, unspecified cervical region: Secondary | ICD-10-CM | POA: Insufficient documentation

## 2022-09-02 ENCOUNTER — Encounter: Payer: Self-pay | Admitting: Internal Medicine

## 2022-09-02 ENCOUNTER — Ambulatory Visit: Payer: Medicare HMO | Attending: Internal Medicine | Admitting: Internal Medicine

## 2022-09-02 ENCOUNTER — Other Ambulatory Visit
Admission: RE | Admit: 2022-09-02 | Discharge: 2022-09-02 | Disposition: A | Discharge status: Home / Self Care | Payer: Medicare HMO | Source: Ambulatory Visit | Attending: Internal Medicine | Admitting: Internal Medicine

## 2022-09-02 VITALS — BP 136/80 | HR 64 | Ht 69.0 in | Wt 217.0 lb

## 2022-09-02 DIAGNOSIS — I25118 Atherosclerotic heart disease of native coronary artery with other forms of angina pectoris: Secondary | ICD-10-CM | POA: Diagnosis not present

## 2022-09-02 DIAGNOSIS — I10 Essential (primary) hypertension: Secondary | ICD-10-CM | POA: Diagnosis not present

## 2022-09-02 DIAGNOSIS — T466X5D Adverse effect of antihyperlipidemic and antiarteriosclerotic drugs, subsequent encounter: Secondary | ICD-10-CM

## 2022-09-02 DIAGNOSIS — E785 Hyperlipidemia, unspecified: Secondary | ICD-10-CM

## 2022-09-02 DIAGNOSIS — G720 Drug-induced myopathy: Secondary | ICD-10-CM

## 2022-09-02 DIAGNOSIS — R0602 Shortness of breath: Secondary | ICD-10-CM

## 2022-09-02 DIAGNOSIS — T466X5A Adverse effect of antihyperlipidemic and antiarteriosclerotic drugs, initial encounter: Secondary | ICD-10-CM

## 2022-09-02 DIAGNOSIS — R0609 Other forms of dyspnea: Secondary | ICD-10-CM | POA: Diagnosis not present

## 2022-09-02 LAB — CBC
HCT: 44.3 % (ref 39.0–52.0)
Hemoglobin: 14.9 g/dL (ref 13.0–17.0)
MCH: 30.7 pg (ref 26.0–34.0)
MCHC: 33.6 g/dL (ref 30.0–36.0)
MCV: 91.2 fL (ref 80.0–100.0)
Platelets: 245 10*3/uL (ref 150–400)
RBC: 4.86 MIL/uL (ref 4.22–5.81)
RDW: 12.9 % (ref 11.5–15.5)
WBC: 8.3 10*3/uL (ref 4.0–10.5)
nRBC: 0 % (ref 0.0–0.2)

## 2022-09-02 LAB — COMPREHENSIVE METABOLIC PANEL
ALT: 27 U/L (ref 0–44)
AST: 33 U/L (ref 15–41)
Albumin: 4.1 g/dL (ref 3.5–5.0)
Alkaline Phosphatase: 58 U/L (ref 38–126)
Anion gap: 8 (ref 5–15)
BUN: 24 mg/dL — ABNORMAL HIGH (ref 8–23)
CO2: 23 mmol/L (ref 22–32)
Calcium: 8.8 mg/dL — ABNORMAL LOW (ref 8.9–10.3)
Chloride: 104 mmol/L (ref 98–111)
Creatinine, Ser: 1.42 mg/dL — ABNORMAL HIGH (ref 0.61–1.24)
GFR, Estimated: 54 mL/min — ABNORMAL LOW (ref >60)
Glucose, Bld: 104 mg/dL — ABNORMAL HIGH (ref 70–99)
Potassium: 4.6 mmol/L (ref 3.5–5.1)
Sodium: 135 mmol/L (ref 135–145)
Total Bilirubin: 1 mg/dL (ref 0.3–1.2)
Total Protein: 7.4 g/dL (ref 6.5–8.1)

## 2022-09-02 LAB — TSH: TSH: 1.438 u[IU]/mL (ref 0.350–4.500)

## 2022-09-02 MED ORDER — ISOSORBIDE MONONITRATE ER 60 MG PO TB24: 60.0000 mg | ORAL_TABLET | Freq: Every day | ORAL | 1 refills | Status: DC

## 2022-09-02 NOTE — Progress Notes (Signed)
Follow-up Outpatient Visit Date: 09/02/2022  Primary Care Provider: Einar Pheasant, [place] [id number] [place] [address]  Chief Complaint: Shortness of breath and fatigue  HPI:  Christopher Villegas is a 67 y.o. male with history of coronary artery disease with chronic total occlusion of the mid RCA managed medically and mild-moderate, nonobstructive left coronary artery disease, hypertension, hyperlipidemia, and carotid artery stenosis status post right carotid endarterectomy, who presents for follow-up of coronary artery disease.  I last saw him a year ago, at which time he was feeling well from a heart standpoint.  He was recovering from a left meniscus tear and stress fracture.  Today, Christopher Villegas reports that he does not feel as strong as he did in the past years.  He gets out of breath easily with modest activity.  He was recently in [place] and had to stop several times while walking with his family to catch his breath.  He is using Breo but has not noticed much improvement.  Rescue inhaler also does not seem to help.  He feels like his dyspnea is gradually getting worse.  He denies chest pain.  He is also dealing with a pinched nerve in his neck leading to right arm discomfort, numbness, and weakness.  He is currently working with a Publishing rights manager in [place] and may ultimately require surgical intervention.  He denies orthopnea, PND, and edema as well as palpitations and lightheadedness.  He notes that he has put on 12 or 13 pounds over the last year.  --------------------------------------------------------------------------------------------------  Cardiovascular History & Procedures: Cardiovascular Problems: Coronary artery disease with stable angina   Risk Factors: Cerebrovascular disease, hypertension, hyperlipidemia, male gender, obesity, age greater than 58, and history of tobacco use.   Cath/PCI: [facility] (07/01/17): LMCA with 30% ostial and distal lesions. LAD with  30% midvessel stenosis. LCX with 20% ostial and 40% proximal lesions. RCA with 40% diffuse midstenosis and chronic total occlusion of the mid/distal RCA. Left-to-right and right-to-right collaterals are present   CV Surgery: Right carotid endarterectomy (12/2007, Dr. Lucky Cowboy)   EP Procedures and Devices: None   Non-Invasive Evaluation(s): Carotid Doppler (05/19/18): Mild bilateral internal carotid artery stenoses.  Greater than 50% stenosis of the right external carotid artery.  Antegrade flow in both vertebral arteries.  Normal flow hemodynamics in both subclavian arteries. Carotid Doppler (05/11/17): Patent right carotid endarterectomy site with no significant internal carotid artery stenosis.  Mild left proximal internal carotid artery disease (less than 40%).  No significant change from prior study in 04/2016. Exercise MPI (11/12/15): No ischemia or scar.  LVEF mildly reduced at 45%.  Good exercise capacity. TTE (11/12/15): Normal biventricular systolic function (LVEF [id number]).  Mild TR and MR.  Recent CV Pertinent Labs: Lab Results  Component Value Date   CHOL 118 04/20/2022   CHOL 135 09/04/2021   HDL 50.90 04/20/2022   HDL 54 09/04/2021   LDLCALC 54 04/20/2022   LDLCALC 68 09/04/2021   LDLCALC 115 (H) 05/25/2017   TRIG 66.0 04/20/2022   CHOLHDL 2 04/20/2022   INR 1.0 06/22/2017   K 4.6 04/20/2022   BUN 21 04/20/2022   BUN 23 10/05/2021   CREATININE 1.29 04/20/2022   CREATININE 1.32 (H) 04/06/2017    Past medical and surgical history were reviewed and updated in EPIC.  Current Meds  Medication Sig   albuterol (VENTOLIN HFA) 108 (90 Base) MCG/ACT inhaler INHALE 1 PUFF INTO THE LUNGS EVERY MORNING (Patient taking differently: as needed.)   aspirin EC 81  MG tablet Take 81 mg by mouth every other day. Swallow whole.   ezetimibe (ZETIA) 10 MG tablet TAKE 1 TABLET BY MOUTH DAILY   fluticasone furoate-vilanterol (BREO ELLIPTA) 100-25 MCG/ACT AEPB Inhale 1 puff into the lungs daily.    isosorbide mononitrate (IMDUR) 30 MG 24 hr tablet Take 1 tablet (30 mg total) by mouth daily.   losartan (COZAAR) 50 MG tablet TAKE 1 TABLET BY MOUTH DAILY   metoprolol tartrate (LOPRESSOR) 25 MG tablet Take 1 tablet (25 mg total) by mouth 2 (two) times daily. Please contact office for appointment [phone]   rosuvastatin (CRESTOR) 10 MG tablet TAKE 1 TABLET BY MOUTH DAILY    Allergies: Plavix [clopidogrel]  Social History   Tobacco Use   Smoking status: Former    Packs/day: 1.50    Years: 45.00    Additional pack years: 0.00    Total pack years: 67.50    Types: Cigarettes    Quit date: 09/29/2014    Years since quitting: 7.9    Passive exposure: Past   Smokeless tobacco: Never  Vaping Use   Vaping Use: Never used  Substance Use Topics   Alcohol use: No    Comment: Former Alcoholic - sober since [id number]   Drug use: No    Family History  Problem Relation Age of Onset   Cancer Mother        breast   Hypertension Mother    Hyperlipidemia Mother    Arthritis Mother        osteo   Hypertension Father    Heart disease Father        CHF   Kidney failure Father        causing death   COPD Father    Alcohol abuse Father    Autoimmune disease Sister    Lupus Sister    Healthy Son    Healthy Son     Review of Systems: A 12-system review of systems was performed and was negative except as noted in the HPI.  --------------------------------------------------------------------------------------------------  Physical Exam: BP 136/80 (BP Location: Left Arm, Patient Position: Sitting, Cuff Size: Large)   Pulse 64   Ht 5\' 9"  (1.753 m)   Wt 217 lb (98.4 kg)   SpO2 98%   BMI 32.05 kg/m   General:  NAD. Neck: No JVD or HJR. Lungs: Clear to auscultation bilaterally without wheezes or crackles. Heart: Regular rate and rhythm without murmurs, rubs, or gallops. Abdomen: Soft, nontender, nondistended. Extremities: No lower extremity edema.  EKG: Normal sinus rhythm  with possible inferior infarct.  No significant change from 09/04/2021.  Lab Results  Component Value Date   WBC 7.1 09/04/2021   HGB 14.3 09/04/2021   HCT 41.7 09/04/2021   MCV 90 09/04/2021   PLT 255 09/04/2021    Lab Results  Component Value Date   NA 136 05/03/2022   K 4.6 04/20/2022   CL 101 04/20/2022   CO2 26 04/20/2022   BUN 21 04/20/2022   CREATININE 1.29 04/20/2022   GLUCOSE 95 04/20/2022   ALT 21 04/20/2022    Lab Results  Component Value Date   CHOL 118 04/20/2022   HDL 50.90 04/20/2022   LDLCALC 54 04/20/2022   TRIG 66.0 04/20/2022   CHOLHDL 2 04/20/2022    --------------------------------------------------------------------------------------------------  ASSESSMENT AND PLAN: Coronary artery disease with stable angina and dyspnea on exertion: Christopher Villegas notes progressive dyspnea on exertion over the last year.  Some of this is almost certainly due to deconditioning  from having been inactive due to knee injury and more recent neck problems.  However, I worry that his dyspnea may represent developing cardiomyopathy or an anginal equivalent given his known CTO of the RCA and mild-moderate left coronary artery disease.  We have agreed to obtain a transthoracic echocardiogram and increase isosorbide mononitrate to 60 mg daily.  Will also continue metoprolol to tartrate 25 mg twice daily for antianginal therapy..  Continue aspirin, ezetimibe, and rosuvastatin for secondary prevention.  If echocardiogram is unrevealing and symptoms persist, we will need to repeat catheterization to assess for progressive CAD and reevaluate the RCA to see if it would be amenable to CTO intervention.  In the setting of his known CAD, I do not think that stress testing and/or coronary CTA would provide adequate assessment of his coronary anatomy.  In the meantime, we will check a CBC, CMP, and TSH today.  Hyperlipidemia: Lipids well-controlled on last check in November.  Defer escalation of  rosuvastatin due to concerns surrounding statin induced myopathy and elevated creatine kinase levels in the past.  Hypertension: Blood pressure borderline elevated today.  We will increase isosorbide mononitrate, as above.  Follow-up: Return to clinic after completion of echocardiogram.  Nelva Bush, MD 09/02/2022 8:35 AM

## 2022-09-02 NOTE — Patient Instructions (Signed)
Medication Instructions:  INCREASE the Imdur to 60 mg once daily  *If you need a refill on your cardiac medications before your next appointment, please call your pharmacy*   Lab Work: Your provider would like for you to have the following labs today: CBC, CMET, TSH  If you have labs (blood work) drawn today and your tests are completely normal, you will receive your results only by: Jette (if you have MyChart) OR A paper copy in the mail If you have any lab test that is abnormal or we need to change your treatment, we will call you to review the results.   Testing/Procedures: Your physician has requested that you have an echocardiogram. Echocardiography is a painless test that uses sound waves to create images of your heart. It provides your doctor with information about the size and shape of your heart and how well your heart's chambers and valves are working.   You may receive an ultrasound enhancing agent through an IV if needed to better visualize your heart during the echo. This procedure takes approximately one hour.  There are no restrictions for this procedure.  This will take place at [facility] ([facility] [id number], [employer]    Follow-Up: At [facility], you and your health needs are our priority.  As part of our continuing mission to provide you with exceptional heart care, we have created designated Provider Care Teams.  These Care Teams include your primary Cardiologist (physician) and Advanced Practice Providers (APPs -  Physician Assistants and Nurse Practitioners) who all work together to provide you with the care you need, when you need it.  We recommend signing up for the patient portal called "MyChart".  Sign up information is provided on this After Visit Summary.  MyChart is used to connect with patients for Virtual Visits (Telemedicine).  Patients are able to view lab/test results, encounter notes, upcoming appointments,  etc.  Non-urgent messages can be sent to your provider as well.   To learn more about what you can do with MyChart, go to [email or web address].    Your next appointment:   Follow up after the echo  Provider:   You may see Nelva Bush, MD or one of the following Advanced Practice Providers on your designated Care Team:   Murray Hodgkins, NP Christell Faith, PA-C Cadence Kathlen Mody, PA-C Gerrie Nordmann, NP

## 2022-09-04 ENCOUNTER — Encounter: Payer: Self-pay | Admitting: Internal Medicine

## 2022-09-04 DIAGNOSIS — R0609 Other forms of dyspnea: Secondary | ICD-10-CM | POA: Insufficient documentation

## 2022-09-09 ENCOUNTER — Other Ambulatory Visit: Payer: Self-pay | Admitting: Internal Medicine

## 2022-09-15 DIAGNOSIS — M5412 Radiculopathy, cervical region: Secondary | ICD-10-CM | POA: Diagnosis not present

## 2022-09-16 DIAGNOSIS — R69 Illness, unspecified: Secondary | ICD-10-CM | POA: Diagnosis not present

## 2022-09-21 ENCOUNTER — Other Ambulatory Visit: Payer: Self-pay | Admitting: Internal Medicine

## 2022-09-22 DIAGNOSIS — M5412 Radiculopathy, cervical region: Secondary | ICD-10-CM | POA: Diagnosis not present

## 2022-09-29 DIAGNOSIS — M5412 Radiculopathy, cervical region: Secondary | ICD-10-CM | POA: Diagnosis not present

## 2022-10-04 ENCOUNTER — Ambulatory Visit (HOSPITAL_BASED_OUTPATIENT_CLINIC_OR_DEPARTMENT_OTHER): Payer: Medicare HMO | Admitting: Pulmonary Disease

## 2022-10-04 ENCOUNTER — Encounter (HOSPITAL_BASED_OUTPATIENT_CLINIC_OR_DEPARTMENT_OTHER): Payer: Self-pay | Admitting: Pulmonary Disease

## 2022-10-04 VITALS — BP 120/66 | HR 68 | Temp 97.8°F | Ht 69.0 in | Wt 218.0 lb

## 2022-10-04 DIAGNOSIS — J4540 Moderate persistent asthma, uncomplicated: Secondary | ICD-10-CM | POA: Diagnosis not present

## 2022-10-04 DIAGNOSIS — R0609 Other forms of dyspnea: Secondary | ICD-10-CM

## 2022-10-04 DIAGNOSIS — R062 Wheezing: Secondary | ICD-10-CM

## 2022-10-04 MED ORDER — ARNUITY ELLIPTA 100 MCG/ACT IN AEPB: 1.0000 | INHALATION_SPRAY | Freq: Every day | RESPIRATORY_TRACT | 5 refills | Status: DC

## 2022-10-04 MED ORDER — ALBUTEROL SULFATE HFA 108 (90 BASE) MCG/ACT IN AERS: 2.0000 | INHALATION_SPRAY | Freq: Four times a day (QID) | RESPIRATORY_TRACT | 1 refills | Status: AC | PRN

## 2022-10-04 NOTE — Patient Instructions (Signed)
Arnuity 1 puff daily and rinse your mouth after each use.  Stop using breo once you start arnuity.  Albuterol 2 puffs every 6 hours as needed for cough, wheeze, shortness of breath or chest congestion.  Follow up in 3 months.

## 2022-10-04 NOTE — Progress Notes (Signed)
[facility]  Chief Complaint  Patient presents with   Follow-up    Follow up. Patient states that he still doesn't have the "wind" he thinks he should have. He doesn't think the medication is any different.     Constitutional:  BP 120/66 (BP Location: Right Arm, Patient Position: Sitting, Cuff Size: Normal)   Pulse 68   Temp 97.8 F (36.6 C) (Oral)   Ht  (1.753 m)   Wt 218 lb (98.9 kg)   SpO2 94%   BMI 32.19 kg/m   Past Medical History:  CAD, GERD, HTN, HLD  Past Surgical History:  He  has a past surgical history that includes Tonsillectomy and adenoidectomy; Cyst excision perineal; Carotid endarterectomy (Right, 2009); Colonoscopy with propofol (N/A, 06/16/2017); Esophagogastroduodenoscopy (egd) with propofol (N/A, 06/16/2017); polypectomy (06/16/2017); LEFT HEART CATH AND CORONARY ANGIOGRAPHY (N/A, 07/01/2017); Biceps tendon repair (2021); and Colonoscopy with propofol (N/A, 06/22/2022).  Brief Summary:  Christopher Villegas is a 67 y.o. male former smoker with asthma and emphysema.      Subjective:   He saw cardiology.  Has Echo scheduled.  If this is unrevealing, then he will get a heart catheterization.  He didn't feel like breo helped more than arnuity.  He did notice that his wheezing stopped once he started regular inhaler use, but still gets winded with walking.  He is having more trouble keeping up with activities.  He pinched a nerve in his neck and this has limited his ability to exercise.  He will being seeing his neurosurgeon.  No having cough, chest congestion, skin rash, or leg swelling.  Sleeping okay.  Physical Exam:   Appearance - well kempt   ENMT - no sinus tenderness, no oral exudate, no LAN, Mallampati 3 airway, no stridor  Respiratory - equal breath sounds bilaterally, no wheezing or rales  CV - s1s2 regular rate and rhythm, no murmurs  Ext - no clubbing, no edema  Skin - no rashes  Psych -  normal mood and affect     Pulmonary testing:  PFT 02/09/21 >> FEV1 3.22 (102%), FEV1% 74, TLC 7.77 (117%), RV 3.60 (159%), DLCO 89%, +BD  Chest Imaging:  LDCT chest 04/29/20 >> small nodules up to 3.7 mm, mild bronchial wall thickening, mild centrilobular and paraseptal emphysema LDCT 05/07/21 >> mild emphysema, b/l nodules up to 3.5 mm LDCT chest 05/07/22 >> no change  Cardiac testing:  [facility] 07/01/17 >> severe single vessel CAD with chronic total occlusion of RCA, mild/mod CAD LMCA/LAD/LCx  Social History:  He  reports that he quit smoking about 8 years ago. His smoking use included cigarettes. He has a 67.50 pack-year smoking history. He has been exposed to tobacco smoke. He has never used smokeless tobacco. He reports that he does not drink alcohol and does not use drugs.  Family History:  His family history includes Alcohol abuse in his father; Arthritis in his mother; Autoimmune disease in his sister; COPD in his father; Cancer in his mother; Healthy in his son and son; Heart disease in his father; Hyperlipidemia in his mother; Hypertension in his father and mother; Kidney failure in his father; Lupus in his sister.     Assessment/Plan:   Moderate persistent asthma, emphysema. - leg cramps from breztri and dulera - breo didn't help any better than arnuity - will switch back to arnuity 100 one puff daily - prn albuterol  History of tobacco abuse. - he will get follow up  LDCT chest in November 2024  Exercised induced asthma. - discussed doing high intensity warm up and using albuterol before exercise as needed, but should improve with dulera therapy  Dyspnea on exertion. - likely combination of asthma, weight gain, and relative deconditioning - he has Echo scheduled through cardiology - if cardiac assessment is unrevealing, then could set up cardiopulmonary exercise test  Coronary artery disease. - followed by Dr. Cristal Deer End with cardiology  Time Spent Involved in  Patient Care on Day of Examination:  36 minutes  Follow up:   Patient Instructions  Arnuity 1 puff daily and rinse your mouth after each use.  Stop using breo once you start arnuity.  Albuterol 2 puffs every 6 hours as needed for cough, wheeze, shortness of breath or chest congestion.  Follow up in 3 months.  Medication List:   Allergies as of 10/04/2022       Reactions   Plavix [clopidogrel] Rash        Medication List        Accurate as of October 04, 2022  9:54 AM. If you have any questions, ask your nurse or doctor.          STOP taking these medications    fluticasone furoate-vilanterol 100-25 MCG/ACT Aepb Commonly known as: Breo Ellipta Stopped by: Coralyn Helling, MD       TAKE these medications    albuterol 108 (90 Base) MCG/ACT inhaler Commonly known as: VENTOLIN HFA Inhale 2 puffs into the lungs every 6 (six) hours as needed for wheezing or shortness of breath. What changed: See the new instructions. Changed by: Coralyn Helling, MD   Arnuity Ellipta 100 MCG/ACT Aepb Generic drug: Fluticasone Furoate Inhale 1 puff into the lungs daily in the afternoon. Started by: Coralyn Helling, MD   aspirin EC 81 MG tablet Take 81 mg by mouth every other day. Swallow whole.   ezetimibe 10 MG tablet Commonly known as: ZETIA TAKE 1 TABLET BY MOUTH DAILY   isosorbide mononitrate 60 MG 24 hr tablet Commonly known as: IMDUR Take 1 tablet (60 mg total) by mouth daily.   losartan 50 MG tablet Commonly known as: COZAAR TAKE 1 TABLET BY MOUTH DAILY   metoprolol tartrate 25 MG tablet Commonly known as: LOPRESSOR Take 1 tablet (25 mg total) by mouth 2 (two) times daily.   rosuvastatin 10 MG tablet Commonly known as: CRESTOR TAKE 1 TABLET BY MOUTH DAILY        Signature:  Coralyn Helling, MD [facility] Pager - [phone] 10/04/2022, 9:54 AM

## 2022-10-07 ENCOUNTER — Encounter: Payer: Self-pay | Admitting: Neurosurgery

## 2022-10-07 ENCOUNTER — Ambulatory Visit: Payer: Medicare HMO | Admitting: Neurosurgery

## 2022-10-07 VITALS — BP 117/69 | HR 63 | Ht 69.0 in | Wt 218.0 lb

## 2022-10-07 DIAGNOSIS — M5412 Radiculopathy, cervical region: Secondary | ICD-10-CM

## 2022-10-07 NOTE — Progress Notes (Signed)
Referring Physician:  Dale Bovey, MD [address] [place],  [place] [address]  Primary Physician:  Dale [place], MD  History of Present Illness: 10/07/2022 Christopher Villegas has been in physical therapy but has not noted any improvement in his numbness or his weakness in his right hand.  08/26/2022 Christopher Villegas is here today with a chief complaint of weakness in his right arm.  He has been having trouble with his grip.  He began having lancinating pain in January 2024.  This went down his right shoulder blade to his right arm and his third through fifth digits.  He has been having some trouble with grip strength.  His pain is gotten better, but he continues to have numbness in his fourth and fifth finger and grip weakness.  He is able to do most things he would like to do.  The numbness and tingling is bothering him significantly.  Changing positions make it better.  Coughing, sneezing, sitting, twisting make it worse.  He seen an outside provider who recommended surgical intervention.  He is here today for second opinion. Bowel/Bladder Dysfunction: none  Conservative measures: chiropractor Physical therapy:   has not participated Multimodal medical therapy including regular antiinflammatories:  ibuprofen, medrol dosepack, methocarbamol, tramadol Injections:  has not received epidural steroid injections  Past Surgery:  denies  Christopher Villegas has no symptoms of cervical myelopathy.  The symptoms are causing a significant impact on the patient's life.   I have utilized the care everywhere function in epic to review the outside records available from external health systems.  Review of Systems:  A 10 point review of systems is negative, except for the pertinent positives and negatives detailed in the HPI.  Past Medical History: Past Medical History:  Diagnosis Date   Asthma    CAD (coronary artery disease)    a. 06/2017 Cath: LM 30ost/d, LAD 21m,  D1/2 large, nl, D3 small, LCX 20ost, 40p, OM1/2/3 nl, RCA 40/152m - CTO w/ L->R collats. RPDA fills via collats from 1st Septal. EF 55-65%-->Med rx (could consider CTO PCI for angina).   Cancer    Basal Cell carcinoma   Carotid arterial disease    a. 2009 s/p R CEA; b. 05/2018 U/S: RICA 1-39%, RECA >50%, LICA 1-39%, LECA <50%.   GERD (gastroesophageal reflux disease)    History of colon polyps    History of echocardiogram    a. 10/2015 Echo: EF 55%. Mild TR/MR.   Hyperlipidemia    Hypertension     Past Surgical History: Past Surgical History:  Procedure Laterality Date   BICEPS TENDON REPAIR  2021   CAROTID ENDARTERECTOMY Right 2009   COLONOSCOPY WITH PROPOFOL N/A 06/16/2017   Procedure: COLONOSCOPY WITH PROPOFOL;  Surgeon: Midge Minium, MD;  Location: [facility];  Service: Endoscopy;  Laterality: N/A;   COLONOSCOPY WITH PROPOFOL N/A 06/22/2022   Procedure: COLONOSCOPY WITH PROPOFOL;  Surgeon: Midge Minium, MD;  [facility];  Service: Endoscopy;  Laterality: N/A;   CYST EXCISION PERINEAL     ESOPHAGOGASTRODUODENOSCOPY (EGD) WITH PROPOFOL N/A 06/16/2017   Procedure: ESOPHAGOGASTRODUODENOSCOPY (EGD) WITH PROPOFOL;  Surgeon: Midge Minium, MD;  Location: [facility];  Service: Endoscopy;  Laterality: N/A;   LEFT HEART CATH AND CORONARY ANGIOGRAPHY N/A 07/01/2017   Procedure: LEFT HEART CATH AND CORONARY ANGIOGRAPHY;  Surgeon: Yvonne Kendall, MD;  Location: [facility];  Service: Cardiovascular;  Laterality: N/A;   POLYPECTOMY  06/16/2017   Procedure: POLYPECTOMY;  Surgeon: Midge Minium, MD;  Location: [facility];  Service: Endoscopy;;   TONSILLECTOMY AND ADENOIDECTOMY      Allergies: Allergies as of 10/07/2022 - Review Complete 10/04/2022  Allergen Reaction Noted   Plavix [clopidogrel] Rash 10/18/2014    Medications: No outpatient medications have been marked as taking for the 10/07/22 encounter (Appointment) with Venetia Night, MD.     Social History: Social History   Tobacco Use   Smoking status: Former    Packs/day: 1.50    Years: 45.00    Additional pack years: 0.00    Total pack years: 67.50    Types: Cigarettes    Quit date: 09/29/2014    Years since quitting: 8.0    Passive exposure: Past   Smokeless tobacco: Never  Vaping Use   Vaping Use: Never used  Substance Use Topics   Alcohol use: No    Comment: Former Alcoholic - sober since 03/20/1989   Drug use: No    Family Medical History: Family History  Problem Relation Age of Onset   Cancer Mother        breast   Hypertension Mother    Hyperlipidemia Mother    Arthritis Mother        osteo   Hypertension Father    Heart disease Father        CHF   Kidney failure Father        causing death   COPD Father    Alcohol abuse Father    Autoimmune disease Sister    Lupus Sister    Healthy Son    Healthy Son     Physical Examination: There were no vitals filed for this visit.   General: Patient is well developed, well nourished, calm, collected, and in no apparent distress. Attention to examination is appropriate.  Neck:   Supple.  Full range of motion.  Respiratory: Patient is breathing without any difficulty.   NEUROLOGICAL:     Awake, alert, oriented to person, place, and time.  Speech is clear and fluent.   Cranial Nerves: Pupils equal round and reactive to light.  Facial tone is symmetric.  Facial sensation is symmetric. Shoulder shrug is symmetric. Tongue protrusion is midline.  There is no pronator drift.  ROM of spine: full.    Strength: Side Biceps Triceps Deltoid Interossei Grip Wrist Ext. Wrist Flex.  R L Side Iliopsoas Quads Hamstring PF DF EHL  R L Reflexes are 1+ and symmetric at the biceps, triceps, brachioradialis, patella and achilles.   Hoffman's is absent.   Bilateral upper and lower extremity sensation is intact to light touch.    No evidence of  dysmetria noted.  Gait is normal.     Medical Decision Making  Imaging: MRI cervical spine from June 30, 2022 shows C7-T1 disc herniation which causes effacement of the right C8 nerve root.  There is moderate bilateral foraminal stenosis.  At C6-7, there is moderate to severe left-sided foraminal stenosis due to uncovertebral hypertrophy and disc herniation.  I have personally reviewed the images and agree with the above interpretation.  Assessment and Plan: Christopher Villegas is a pleasant 67 y.o. 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  He continues to have numbness as well.  Physical therapy did not help.  At this point, he has  maximized conservative management.  I recommended surgical intervention with a right-sided C7-T1 posterior foraminotomy.  Depending on the ability to access the disc herniation, I may also perform discectomy at the same time.    I discussed the planned procedure at length with the patient, including the risks, benefits, alternatives, and indications. The risks discussed include but are not limited to bleeding, infection, need for reoperation, spinal fluid leak, stroke, vision loss, anesthetic complication, coma, paralysis, and even death. I also described in detail that improvement was not guaranteed.  The patient expressed understanding of these risks, and asked that we proceed with surgery. I described the surgery in layman's terms, and gave ample opportunity for questions, which were answered to the best of my ability.     Mihira Tozzi K. Myer Haff MD, [facility]

## 2022-10-07 NOTE — Patient Instructions (Signed)
Please see below for information in regards to your upcoming surgery:   Planned surgery: Right C7-T1 foraminotomy    Surgery date: 12/03/22 - you will find out your arrival time the business day before your surgery.   Pre-op appointment at [facility] Pre-admit Testing: we will call you with a date/time for this. Pre-admit testing is located on the first floor of the [facility], [address] [facility] [address], [address]. Please bring all prescriptions in the original prescription bottles to your appointment, even if you have reviewed medications by phone with a pharmacy representative. Pre-op labs may be done at your pre-op appointment. You are not required to fast for these labs. Should you need to change your pre-op appointment, please call Pre-admit testing at [phone].    Surgical clearance: we will send a clearance form to Dr Lorin Picket    Blood thinners: ok to stay on aspirin      If you have FMLA/disability paperwork, please drop it off or fax it to [phone], attention Patty.   We can be reached by phone or mychart 8am-4pm, Monday-Friday. If you have any questions/concerns before or after surgery, you can reach [place] at [phone], or you can send a mychart message. If you have a concern after hours that cannot wait until normal business hours, you can call [phone] and ask to page the neurosurgeon on call for [place].    Appointments/FMLA & disability paperwork: Patty & Cristin  Nurse: Royston Cowper  Medical assistants: Laurann Montana Physician Assistant's: Manning Charity & Drake Leach Surgeon: Venetia Night, MD

## 2022-10-12 ENCOUNTER — Ambulatory Visit: Payer: Medicare HMO | Admitting: Internal Medicine

## 2022-10-12 ENCOUNTER — Encounter: Payer: Self-pay | Admitting: Internal Medicine

## 2022-10-12 VITALS — BP 122/70 | HR 65 | Temp 97.9°F | Resp 16 | Ht 69.0 in | Wt 214.0 lb

## 2022-10-12 DIAGNOSIS — E785 Hyperlipidemia, unspecified: Secondary | ICD-10-CM | POA: Diagnosis not present

## 2022-10-12 DIAGNOSIS — R0609 Other forms of dyspnea: Secondary | ICD-10-CM | POA: Diagnosis not present

## 2022-10-12 DIAGNOSIS — M5020 Other cervical disc displacement, unspecified cervical region: Secondary | ICD-10-CM

## 2022-10-12 DIAGNOSIS — I6523 Occlusion and stenosis of bilateral carotid arteries: Secondary | ICD-10-CM | POA: Diagnosis not present

## 2022-10-12 DIAGNOSIS — J45909 Unspecified asthma, uncomplicated: Secondary | ICD-10-CM | POA: Diagnosis not present

## 2022-10-12 DIAGNOSIS — R739 Hyperglycemia, unspecified: Secondary | ICD-10-CM

## 2022-10-12 DIAGNOSIS — I10 Essential (primary) hypertension: Secondary | ICD-10-CM | POA: Diagnosis not present

## 2022-10-12 DIAGNOSIS — I25118 Atherosclerotic heart disease of native coronary artery with other forms of angina pectoris: Secondary | ICD-10-CM | POA: Diagnosis not present

## 2022-10-12 DIAGNOSIS — N1831 Chronic kidney disease, stage 3a: Secondary | ICD-10-CM

## 2022-10-12 DIAGNOSIS — Z860100 Personal history of colon polyps, unspecified: Secondary | ICD-10-CM

## 2022-10-12 LAB — BASIC METABOLIC PANEL
BUN: 22 mg/dL (ref 6–23)
CO2: 22 mEq/L (ref 19–32)
Calcium: 9.1 mg/dL (ref 8.4–10.5)
Chloride: 105 mEq/L (ref 96–112)
Creatinine, Ser: 1.31 mg/dL (ref 0.40–1.50)
GFR: 55.95 mL/min — ABNORMAL LOW (ref >60.00)
Glucose, Bld: 98 mg/dL (ref 70–99)
Potassium: 4.8 mEq/L (ref 3.5–5.1)
Sodium: 135 mEq/L (ref 135–145)

## 2022-10-12 LAB — HEPATIC FUNCTION PANEL
ALT: 27 U/L (ref 0–53)
AST: 36 U/L (ref 0–37)
Albumin: 4.2 g/dL (ref 3.5–5.2)
Alkaline Phosphatase: 49 U/L (ref 39–117)
Bilirubin, Direct: 0.2 mg/dL (ref 0.0–0.3)
Total Bilirubin: 0.8 mg/dL (ref 0.2–1.2)
Total Protein: 6.9 g/dL (ref 6.0–8.3)

## 2022-10-12 LAB — LIPID PANEL
Cholesterol: 132 mg/dL (ref 0–200)
HDL: 49.3 mg/dL (ref >39.00)
LDL Cholesterol: 64 mg/dL (ref 0–99)
NonHDL: 82.21
Total CHOL/HDL Ratio: 3
Triglycerides: 90 mg/dL (ref 0.0–149.0)
VLDL: 18 mg/dL (ref 0.0–40.0)

## 2022-10-12 LAB — HEMOGLOBIN A1C: Hgb A1c MFr Bld: 5.9 % (ref 4.6–6.5)

## 2022-10-12 NOTE — Progress Notes (Signed)
Subjective:    Patient ID: Christopher Villegas, male    DOB: 09-13-1955, 67 y.o.   MRN: [id number]  Patient here for  Chief Complaint  Patient presents with   Medical Management of Chronic Issues    HPI Here to follow up regarding CAD, hypercholesterolemia, hypertension and carotid artery disease.  Followed by Dr End.  Recently - worsening sob with exertion.  Saw Dr End 09/02/22 - recommended echo and increase isosorbide to 60mg  q day.  ECHO scheduled for tomorrow.  Dr Craige Cotta - adult onset asthma. Changed to arnuity.  Breathing stable. History of tobacco abuse - planning LDCT 04/2023.  No abdominal pain or bowel change noted. Colonoscopy 06/22/22. Has been having weakness - right arm/numbness.  Saw Dr Myer Haff. MRI cervical spine from June 30, 2022 shows C7-T1 disc herniation which causes effacement of the right C8 nerve root.  There is moderate bilateral foraminal stenosis.  At C6-7, there is moderate to severe left-sided foraminal stenosis due to uncovertebral hypertrophy and disc herniation.  Discussed surgery. Desired PT. Reevaluated 10/07/22 - felt to have maximized conservative management.  Discussed surgery.  He wants to hold.  Discussed today.     Past Medical History:  Diagnosis Date   Asthma    CAD (coronary artery disease)    a. 06/2017 Cath: LM 30ost/d, LAD 49m, D1/2 large, nl, D3 small, LCX 20ost, 40p, OM1/2/3 nl, RCA 40/151m - CTO w/ L->R collats. RPDA fills via collats from 1st Septal. EF 55-65%-->Med rx (could consider CTO PCI for angina).   Cancer [facility])    Basal Cell carcinoma   Carotid arterial disease (HCC)    a. 2009 s/p R CEA; b. 05/2018 U/S: RICA 1-39%, RECA >50%, LICA 1-39%, LECA <50%.   GERD (gastroesophageal reflux disease)    History of colon polyps    History of echocardiogram    a. 10/2015 Echo: EF 55%. Mild TR/MR; b. 10/2022 Echo: EF 55-60%, no rwma, nl diast fxn, nl RV fxn, mildly dil LA, no significant valvular disease.   Hyperlipidemia    Hypertension     Past Surgical History:  Procedure Laterality Date   BICEPS TENDON REPAIR  2021   CAROTID ENDARTERECTOMY Right 2009   COLONOSCOPY WITH PROPOFOL N/A 06/16/2017   Procedure: COLONOSCOPY WITH PROPOFOL;  Surgeon: Midge Minium, MD;  Location: [facility];  Service: Endoscopy;  Laterality: N/A;   COLONOSCOPY WITH PROPOFOL N/A 06/22/2022   Procedure: COLONOSCOPY WITH PROPOFOL;  Surgeon: Midge Minium, MD;  Location: [facility];  Service: Endoscopy;  Laterality: N/A;   CYST EXCISION PERINEAL     ESOPHAGOGASTRODUODENOSCOPY (EGD) WITH PROPOFOL N/A 06/16/2017   Procedure: ESOPHAGOGASTRODUODENOSCOPY (EGD) WITH PROPOFOL;  Surgeon: Midge Minium, MD;  Location: [facility];  Service: Endoscopy;  Laterality: N/A;   LEFT HEART CATH AND CORONARY ANGIOGRAPHY N/A 07/01/2017   Procedure: LEFT HEART CATH AND CORONARY ANGIOGRAPHY;  Surgeon: Yvonne Kendall, MD;  Location: [facility] INVASIVE CV LAB;  Service: Cardiovascular;  Laterality: N/A;   POLYPECTOMY  06/16/2017   Procedure: POLYPECTOMY;  Surgeon: Midge Minium, MD;  Location: [facility];  Service: Endoscopy;;   TONSILLECTOMY AND ADENOIDECTOMY     Family History  Problem Relation Age of Onset   Cancer Mother        breast   Hypertension Mother    Hyperlipidemia Mother    Arthritis Mother        osteo   Hypertension Father    Heart disease Father  CHF   Kidney failure Father        causing death   COPD Father    Alcohol abuse Father    Autoimmune disease Sister    Lupus Sister    Healthy Son    Healthy Son    Social History   Socioeconomic History   Marital status: Married    Spouse name: Beth   Number of children: 2   Years of education: 16   Highest education level: Bachelor's degree (e.g., BA, AB, BS)  Occupational History   Occupation: [employer]  Tobacco Use   Smoking status: Former    Packs/day: 1.50    Years: 45.00    Additional pack years: 0.00    Total pack years: 67.50     Types: Cigarettes    Quit date: 09/29/2014    Years since quitting: 8.0    Passive exposure: Past   Smokeless tobacco: Never  Vaping Use   Vaping Use: Never used  Substance and Sexual Activity   Alcohol use: No    Comment: Former Alcoholic - sober since 03/20/1989   Drug use: No   Sexual activity: Yes  Other Topics Concern   Not on file  Social History Narrative   Not on file   Social Determinants of Health   Financial Resource Strain: Low Risk  (10/09/2022)   Overall Financial Resource Strain (CARDIA)    Difficulty of Paying Living Expenses: Not hard at all  Food Insecurity: No Food Insecurity (10/09/2022)   Hunger Vital Sign    Worried About Running Out of Food in the Last Year: Never true    Ran Out of Food in the Last Year: Never true  Transportation Needs: No Transportation Needs (10/09/2022)   PRAPARE - Administrator, Civil Service (Medical): No    Lack of Transportation (Non-Medical): No  Physical Activity: Insufficiently Active (10/09/2022)   Exercise Vital Sign    Days of Exercise per Week: 2 days    Minutes of Exercise per Session: 30 min  Stress: No Stress Concern Present (10/09/2022)   [employer] of Occupational Health - Occupational Stress Questionnaire    Feeling of Stress : Not at all  Social Connections: Unknown (10/09/2022)   Social Connection and Isolation Panel [NHANES]    Frequency of Communication with Friends and Family: More than three times a week    Frequency of Social Gatherings with Friends and Family: Once a week    Attends Religious Services: Patient declined    Database administrator or Organizations: Patient declined    Attends Engineer, structural: Not on file    Marital Status: Married     Review of Systems  Constitutional:  Negative for appetite change and unexpected weight change.  HENT:  Negative for congestion and sinus pressure.   Respiratory:  Negative for cough and chest tightness.        Breathing stable.    Cardiovascular:  Negative for chest pain and palpitations.  Gastrointestinal:  Negative for abdominal pain, diarrhea, nausea and vomiting.  Genitourinary:  Negative for difficulty urinating and dysuria.  Musculoskeletal:  Negative for joint swelling and myalgias.  Skin:  Negative for color change and rash.  Neurological:  Negative for dizziness and headaches.  Psychiatric/Behavioral:  Negative for agitation and dysphoric mood.        Objective:     BP 122/70   Pulse 65   Temp 97.9 F (36.6 C)   Resp 16  Ht 5\' 9"  (1.753 m)   Wt 214 lb (97.1 kg)   SpO2 99%   BMI 31.60 kg/m  Wt Readings from Last 3 Encounters:  10/15/22 214 lb 6 oz (97.2 kg)  10/12/22 214 lb (97.1 kg)  10/07/22 218 lb (98.9 kg)    Physical Exam Vitals reviewed.  Constitutional:      General: He is not in acute distress.    Appearance: Normal appearance. He is well-developed.  HENT:     Head: Normocephalic and atraumatic.     Right Ear: External ear normal.     Left Ear: External ear normal.  Eyes:     General: No scleral icterus.       Right eye: No discharge.        Left eye: No discharge.     Conjunctiva/sclera: Conjunctivae normal.  Cardiovascular:     Rate and Rhythm: Normal rate and regular rhythm.  Pulmonary:     Effort: Pulmonary effort is normal. No respiratory distress.     Breath sounds: Normal breath sounds.  Abdominal:     General: Bowel sounds are normal.     Palpations: Abdomen is soft.     Tenderness: There is no abdominal tenderness.  Musculoskeletal:        General: No swelling or tenderness.     Cervical back: Neck supple. No tenderness.  Lymphadenopathy:     Cervical: No cervical adenopathy.  Skin:    Findings: No erythema or rash.  Neurological:     Mental Status: He is alert.  Psychiatric:        Mood and Affect: Mood normal.        Behavior: Behavior normal.      Outpatient Encounter Medications as of 10/12/2022  Medication Sig   albuterol (VENTOLIN HFA) 108  (90 Base) MCG/ACT inhaler Inhale 2 puffs into the lungs every 6 (six) hours as needed for wheezing or shortness of breath.   aspirin EC 81 MG tablet Take 81 mg by mouth every other day. Swallow whole.   ezetimibe (ZETIA) 10 MG tablet TAKE 1 TABLET BY MOUTH DAILY   Fluticasone Furoate (ARNUITY ELLIPTA) 100 MCG/ACT AEPB Inhale 1 puff into the lungs daily in the afternoon.   isosorbide mononitrate (IMDUR) 60 MG 24 hr tablet Take 1 tablet (60 mg total) by mouth daily.   losartan (COZAAR) 50 MG tablet TAKE 1 TABLET BY MOUTH DAILY   metoprolol tartrate (LOPRESSOR) 25 MG tablet Take 1 tablet (25 mg total) by mouth 2 (two) times daily.   rosuvastatin (CRESTOR) 10 MG tablet TAKE 1 TABLET BY MOUTH DAILY   No facility-administered encounter medications on file as of 10/12/2022.     Lab Results  Component Value Date   WBC 7.6 10/15/2022   HGB 14.1 10/15/2022   HCT 41.0 10/15/2022   PLT 231 10/15/2022   GLUCOSE 98 10/12/2022   CHOL 132 10/12/2022   TRIG 90.0 10/12/2022   HDL 49.30 10/12/2022   LDLCALC 64 10/12/2022   ALT 27 10/12/2022   AST 36 10/12/2022   NA 135 10/12/2022   K 4.8 10/12/2022   CL 105 10/12/2022   CREATININE 1.31 10/12/2022   BUN 22 10/12/2022   CO2 22 10/12/2022   TSH 1.438 09/02/2022   PSA 0.7 04/06/2017   INR 1.0 06/22/2017   HGBA1C 5.9 10/12/2022    No results found.     Assessment & Plan:  Essential hypertension Assessment & Plan: Continue losartan.  Follow pressures.  Follow metabolic panel.  Orders: -     Basic metabolic panel  Stage 3a chronic kidney disease (HCC) Assessment & Plan: Avoid antiinflammatories.  Continue losartan.  Follow metabolic panel.   Orders: -     Basic metabolic panel  Hyperglycemia Assessment & Plan: Documented elevation.  Check met b and A1c. Low carb diet and exercise.   Orders: -     Hemoglobin A1c  Hyperlipidemia LDL goal <70 Assessment & Plan: Low cholesterol diet and exercise.  On crestor and zetia.  Follow lipid  panel and liver function tests.   Orders: -     Lipid panel -     Hepatic function panel  Bilateral carotid artery stenosis Assessment & Plan: carotid artery stenosis status post right carotid endarterectomy. Continue risk factor modification.  Continues on crestor and zetia. Continue aspirin.    Cervical disc herniation Assessment & Plan: Saw Dr Myer Haff. MRI cervical spine from June 30, 2022 shows C7-T1 disc herniation which causes effacement of the right C8 nerve root.  There is moderate bilateral foraminal stenosis.  At C6-7, there is moderate to severe left-sided foraminal stenosis due to uncovertebral hypertrophy and disc herniation.  Discussed surgery. Desired PT. Reevaluated 10/07/22 - felt to have maximized conservative management.  Discussed surgery.  He wants to hold.     Coronary artery disease of native artery of native heart with stable angina pectoris [facility]) Assessment & Plan: History of coronary artery disease with chronic total occlusion of the mid RCA managed medically.  Sees Dr End.  Continue aspirin, crestor and zetia.  Saw Dr End 09/02/22 - recommended echo and increase isosorbide to 60mg  q day.  ECHO planned for tomorrow.    Dyspnea on exertion Assessment & Plan: Seeing cardiology as outlined.  Planning for echo tomorrow.    History of colon polyps Assessment & Plan: Colonoscopy 06/2017 - Three 2 to 4 mm polyps in the descending colon. One 3 mm polyp in the sigmoid colon.  Recommended f/u 5 years.  Colonoscopy 06/2022 - one 2mm polyp transverse colon and diverticulosis.  Recommended f/u colonoscopy in 7 years.    Uncomplicated asthma, unspecified asthma severity, unspecified whether persistent Assessment & Plan: Has seen Dr Craige Cotta.  Has albuterol to take prn.  On arnuity. Breathing stable.        Dale [place], MD

## 2022-10-13 ENCOUNTER — Ambulatory Visit: Payer: Medicare HMO | Attending: Internal Medicine

## 2022-10-13 DIAGNOSIS — R0609 Other forms of dyspnea: Secondary | ICD-10-CM

## 2022-10-13 LAB — ECHOCARDIOGRAM COMPLETE
AR max vel: 3.83 cm2
AV Area VTI: 3.54 cm2
AV Area mean vel: 3.88 cm2
AV Mean grad: 2 mmHg
AV Peak grad: 4.2 mmHg
Ao pk vel: 1.03 m/s
Area-P 1/2: 3.37 cm2
Calc EF: 52.1 %
S' Lateral: 3.2 cm
Single Plane A2C EF: 49.2 %
Single Plane A4C EF: 53.3 %

## 2022-10-15 ENCOUNTER — Other Ambulatory Visit: Payer: Self-pay

## 2022-10-15 ENCOUNTER — Ambulatory Visit: Payer: Medicare HMO | Attending: Nurse Practitioner | Admitting: Nurse Practitioner

## 2022-10-15 ENCOUNTER — Other Ambulatory Visit
Admission: RE | Admit: 2022-10-15 | Discharge: 2022-10-15 | Disposition: A | Discharge status: Home / Self Care | Payer: Medicare HMO | Source: Ambulatory Visit | Attending: Nurse Practitioner | Admitting: Nurse Practitioner

## 2022-10-15 ENCOUNTER — Encounter: Payer: Self-pay | Admitting: Nurse Practitioner

## 2022-10-15 VITALS — BP 98/60 | HR 66 | Ht 69.0 in | Wt 214.4 lb

## 2022-10-15 DIAGNOSIS — R0609 Other forms of dyspnea: Secondary | ICD-10-CM | POA: Diagnosis not present

## 2022-10-15 DIAGNOSIS — I6529 Occlusion and stenosis of unspecified carotid artery: Secondary | ICD-10-CM | POA: Diagnosis not present

## 2022-10-15 DIAGNOSIS — I25118 Atherosclerotic heart disease of native coronary artery with other forms of angina pectoris: Secondary | ICD-10-CM | POA: Diagnosis not present

## 2022-10-15 DIAGNOSIS — E785 Hyperlipidemia, unspecified: Secondary | ICD-10-CM

## 2022-10-15 DIAGNOSIS — I10 Essential (primary) hypertension: Secondary | ICD-10-CM

## 2022-10-15 DIAGNOSIS — Z01818 Encounter for other preprocedural examination: Secondary | ICD-10-CM

## 2022-10-15 LAB — CBC
HCT: 41 % (ref 39.0–52.0)
Hemoglobin: 14.1 g/dL (ref 13.0–17.0)
MCH: 30.9 pg (ref 26.0–34.0)
MCHC: 34.4 g/dL (ref 30.0–36.0)
MCV: 89.7 fL (ref 80.0–100.0)
Platelets: 231 10*3/uL (ref 150–400)
RBC: 4.57 MIL/uL (ref 4.22–5.81)
RDW: 13.1 % (ref 11.5–15.5)
WBC: 7.6 10*3/uL (ref 4.0–10.5)
nRBC: 0 % (ref 0.0–0.2)

## 2022-10-15 MED ORDER — NITROGLYCERIN 0.4 MG SL SUBL: 0.4000 mg | SUBLINGUAL_TABLET | SUBLINGUAL | 0 refills | Status: AC | PRN

## 2022-10-15 NOTE — Patient Instructions (Signed)
Medication Instructions:  Take Nitroglycerin as needed for chest pain: Place one tablet under the tongue as needed every 5 minutes for chest pain. If you have to use three tablets with no relief, please call 911 or go to your closest Emergency Department.  *If you need a refill on your cardiac medications before your next appointment, please call your pharmacy*   Lab Work: Your provider would like for you to have following labs drawn: CBC.   Please go to the [facility] entrance and check in at the front desk.  You do not need an appointment.  They are open from 7am-6 pm.   If you have labs (blood work) drawn today and your tests are completely normal, you will receive your results only by: MyChart Message (if you have MyChart) OR A paper copy in the mail If you have any lab test that is abnormal or we need to change your treatment, we will call you to review the results.   Testing/Procedures: Your physician has requested that you have a carotid duplex. This test is an ultrasound of the carotid arteries in your neck. It looks at blood flow through these arteries that supply the brain with blood.   Allow one hour for this exam.  There are no restrictions or special instructions.  This will take place at [address] ([facility]) [address], [place] [address]   Follow-Up: At [facility], you and your health needs are our priority.  As part of our continuing mission to provide you with exceptional heart care, we have created designated Provider Care Teams.  These Care Teams include your primary Cardiologist (physician) and Advanced Practice Providers (APPs -  Physician Assistants and Nurse Practitioners) who all work together to provide you with the care you need, when you need it.  We recommend signing up for the patient portal called "MyChart".  Sign up information is provided on this After Visit Summary.  MyChart is used to connect with patients for Virtual  Visits (Telemedicine).  Patients are able to view lab/test results, encounter notes, upcoming appointments, etc.  Non-urgent messages can be sent to your provider as well.   To learn more about what you can do with MyChart, go to [email or web address].    Your next appointment:   3 week(s)  Provider:   You may see Yvonne Kendall, MD or one of the following Advanced Practice Providers on your designated Care Team:   Nicolasa Ducking, NP  Other Instructions  [facility] A DEPT OF [place]. [facility] AT [facility] [address] [place], [address] [place] [address] Dept: [phone] Loc: [phone]  Christopher Villegas  10/15/2022  You are scheduled for a Cardiac Catheterization on Wednesday, May 8 with Dr. Cristal Deer End.  1. Please arrive at the Heart & Vascular Center Entrance of [facility], [address], [place] [address] at 11:30 AM (This is 1 hour(s) prior to your procedure time).  Proceed to the Check-In Desk directly inside the entrance.  Procedure Parking: Use the entrance off of the [address] side of the hospital. Turn right upon entering and follow the driveway to parking that is directly in front of the Heart & Vascular Center. There is no valet parking available at this entrance, however there is an awning directly in front of the Heart & Vascular Center for drop off/ pick up for patients.  Special note: Every effort is made to have your procedure  done on time. Please understand that emergencies sometimes delay scheduled procedures.  2. Diet: Do not eat solid foods after midnight.  The patient may have clear liquids until 5am upon the day of the procedure.  3. Labs: You will need to have blood drawn on 5/3.  4. Medication instructions in preparation for your procedure: Nothing to hold  On the morning of your procedure, take your Aspirin 81 mg and any morning medicines NOT listed above.  You may use sips of  water.  5. Plan to go home the same day, you will only stay overnight if medically necessary. 6. Bring a current list of your medications and current insurance cards. 7. You MUST have a responsible person to drive you home. 8. Someone MUST be with you the first 24 hours after you arrive home or your discharge will be delayed. 9. Please wear clothes that are easy to get on and off and wear slip-on shoes.  Thank you for allowing [place] to care for you!   -- [facility]

## 2022-10-15 NOTE — H&P (View-Only) (Signed)
Office Visit    Patient Villegas: Christopher Villegas Date of Encounter: 10/15/2022  Primary Care Provider:  Dale , MD Primary Cardiologist:  Christopher Kendall, MD  Chief Complaint    67 year old male with history of CAD, hypertension, hyperlipidemia, carotid arterial disease, asthma, remote tobacco abuse, and GERD, who presents for follow-up related to dyspnea on exertion.  Past Medical History    Past Medical History:  Diagnosis Date   Asthma    CAD (coronary artery disease)    a. 06/2017 Cath: LM 30ost/d, LAD 29m, D1/2 large, nl, D3 small, LCX 20ost, 40p, OM1/2/3 nl, RCA 40/117m - CTO w/ L->R collats. RPDA fills via collats from 1st Septal. EF 55-65%-->Med rx (could consider CTO PCI for angina).   Cancer Christopher Villegas)    Basal Cell carcinoma   Carotid arterial disease (HCC)    a. 2009 s/p R CEA; b. 05/2018 U/S: RICA 1-39%, RECA >50%, LICA 1-39%, LECA <50%.   GERD (gastroesophageal reflux disease)    History of colon polyps    History of echocardiogram    a. 10/2015 Echo: EF 55%. Mild TR/MR; b. 10/2022 Echo: EF 55-60%, Christopher rwma, nl diast fxn, nl RV fxn, mildly dil LA, Christopher significant valvular disease.   Hyperlipidemia    Hypertension    Past Surgical History:  Procedure Laterality Date   BICEPS TENDON REPAIR  2021   CAROTID ENDARTERECTOMY Right 2009   COLONOSCOPY WITH PROPOFOL N/A 06/16/2017   Procedure: COLONOSCOPY WITH PROPOFOL;  Surgeon: Midge Minium, MD;  Location: [facility];  Service: Endoscopy;  Laterality: N/A;   COLONOSCOPY WITH PROPOFOL N/A 06/22/2022   Procedure: COLONOSCOPY WITH PROPOFOL;  Surgeon: Midge Minium, MD;  Location: [facility];  Service: Endoscopy;  Laterality: N/A;   CYST EXCISION PERINEAL     ESOPHAGOGASTRODUODENOSCOPY (EGD) WITH PROPOFOL N/A 06/16/2017   Procedure: ESOPHAGOGASTRODUODENOSCOPY (EGD) WITH PROPOFOL;  Surgeon: Midge Minium, MD;  Location: [facility];  Service: Endoscopy;  Laterality: N/A;   LEFT HEART CATH AND CORONARY  ANGIOGRAPHY N/A 07/01/2017   Procedure: LEFT HEART CATH AND CORONARY ANGIOGRAPHY;  Surgeon: Christopher Kendall, MD;  Location: [facility];  Service: Cardiovascular;  Laterality: N/A;   POLYPECTOMY  06/16/2017   Procedure: POLYPECTOMY;  Surgeon: Midge Minium, MD;  Location: [facility];  Service: Endoscopy;;   TONSILLECTOMY AND ADENOIDECTOMY      Allergies  Allergies  Allergen Reactions   Plavix [Clopidogrel] Rash    History of Present Illness    67 year old male with above past medical history including CAD, hypertension, hyperlipidemia, carotid arterial disease, asthma, remote tobacco abuse, and GERD.  He is status post right carotid endarterectomy in 2009 with 1 to 39% bilateral internal carotid artery stenoses on most recent carotid ultrasound in December 2019.  Diagnostic catheterization January 2019 in the setting of angina showed chronic total occlusion of the mid right coronary artery with left to right collaterals.  Otherwise a diffuse, nonobstructive disease and he was medically managed.  Christopher Villegas was last seen in cardiology clinic in March of this year, at which time he reported dyspnea on exertion that was recalcitrant to use of Arnuity and rescue inhaler.  He also reported a pinched nerve in his neck leading to right arm discomfort, numbness, and weakness, for which he has been seen by neurosurgery Christopher Villegas.  2D echocardiogram was performed and showed normal LV function without significant valvular abnormalities.  Isosorbide was also increased to 60 mg daily.  Since his last visit, Christopher Villegas  has continued to experience dyspnea on exertion.  He has not noticed any significant improvement despite titration of isosorbide.  He does not experience chest pain.  He notes that if he walks more slowly, he is able to participate in activities like mowing his lawn, but any incline results and significant dyspnea.  He denies palpitations, PND, orthopnea, dizziness, syncope, edema, or  early satiety.  He is interested in pursuing diagnostic catheterization, as he would like to get back to a regular exercise regimen but is concerned about his heart.  Home Medications    Current Outpatient Medications  Medication Sig Dispense Refill   albuterol (VENTOLIN HFA) 108 (90 Base) MCG/ACT inhaler Inhale 2 puffs into the lungs every 6 (six) hours as needed for wheezing or shortness of breath. 18 g 1   aspirin EC 81 MG tablet Take 81 mg by mouth every other day. Swallow whole.     ezetimibe (ZETIA) 10 MG tablet TAKE 1 TABLET BY MOUTH DAILY 90 tablet 1   Fluticasone Furoate (ARNUITY ELLIPTA) 100 MCG/ACT AEPB Inhale 1 puff into the lungs daily in the afternoon. 30 each 5   isosorbide mononitrate (IMDUR) 60 MG 24 hr tablet Take 1 tablet (60 mg total) by mouth daily. 90 tablet 1   losartan (COZAAR) 50 MG tablet TAKE 1 TABLET BY MOUTH DAILY 30 tablet 11   metoprolol tartrate (LOPRESSOR) 25 MG tablet Take 1 tablet (25 mg total) by mouth 2 (two) times daily. 180 tablet 0   rosuvastatin (CRESTOR) 10 MG tablet TAKE 1 TABLET BY MOUTH DAILY 90 tablet 0   Christopher current facility-administered medications for this visit.    Family History    Family History  Problem Relation Age of Onset   Cancer Mother        breast   Hypertension Mother    Hyperlipidemia Mother    Arthritis Mother        osteo   Hypertension Father    Heart disease Father        CHF   Kidney failure Father        causing death   COPD Father    Alcohol abuse Father    Autoimmune disease Sister    Lupus Sister    Healthy Son    Healthy Son     Social History    Social History   Socioeconomic History   Marital status: Married    Spouse Villegas: Beth   Number of children: 2   Years of education: 16   Highest education level: Bachelor's degree (e.g., BA, AB, BS)  Occupational History   Occupation: [employer]  Tobacco Use   Smoking status: Former    Packs/day: 1.50    Years: 45.00     Additional pack years: 0.00    Total pack years: 67.50    Types: Cigarettes    Quit date: 09/29/2014    Years since quitting: 8.0    Passive exposure: Past   Smokeless tobacco: Never  Vaping Use   Vaping Use: Never used  Substance and Sexual Activity   Alcohol use: Christopher    Comment: Former Alcoholic - sober since 03/20/1989   Drug use: Christopher   Sexual activity: Yes  Other Topics Concern   Not on file  Social History Narrative   Not on file   Social Determinants of Health   Financial Resource Strain: Low Risk  (10/09/2022)   Overall Financial Resource Strain (CARDIA)    Difficulty of Paying  Living Expenses: Not hard at all  Food Insecurity: Christopher Food Insecurity (10/09/2022)   Hunger Vital Sign    Worried About Running Out of Food in the Last Year: Never true    Ran Out of Food in the Last Year: Never true  Transportation Needs: Christopher Transportation Needs (10/09/2022)   PRAPARE - Administrator, Civil Service (Medical): Christopher    Lack of Transportation (Non-Medical): Christopher  Physical Activity: Insufficiently Active (10/09/2022)   Exercise Vital Sign    Days of Exercise per Week: 2 days    Minutes of Exercise per Session: 30 min  Stress: Christopher Stress Concern Present (10/09/2022)   [employer] of Occupational Health - Occupational Stress Questionnaire    Feeling of Stress : Not at all  Social Connections: Unknown (10/09/2022)   Social Connection and Isolation Panel [NHANES]    Frequency of Communication with Friends and Family: More than three times a week    Frequency of Social Gatherings with Friends and Family: Once a week    Attends Religious Services: Patient declined    Database administrator or Organizations: Patient declined    Attends Banker Meetings: Not on file    Marital Status: Married  Catering manager Violence: Not on file    Review of Systems    Ongoing dyspnea on exertion.  He denies chest pain, palpitations, PND, orthopnea, dizziness, syncope, edema,  or early satiety.  All other systems reviewed and are otherwise negative except as noted above.   Physical Exam    VS:  BP 98/60 (BP Location: Left Arm, Patient Position: Sitting, Cuff Size: Large)   Pulse 66   Ht 5\' 9"  (1.753 m)   Wt 214 lb 6 oz (97.2 kg)   SpO2 97%   BMI 31.66 kg/m  , BMI Body mass index is 31.66 kg/m.     GEN: Well nourished, well developed, in Christopher acute distress. HEENT: normal. Neck: Supple, Christopher JVD, carotid bruits, or masses. Cardiac: RRR, Christopher murmurs, rubs, or gallops. Christopher clubbing, cyanosis, edema.  Radials 2+/PT 2+ and equal bilaterally.  Respiratory:  Respirations regular and unlabored, clear to auscultation bilaterally. GI: Soft, nontender, nondistended, BS + x 4. MS: Christopher deformity or atrophy. Skin: warm and dry, Christopher rash. Neuro:  Strength and sensation are intact. Psych: Normal affect.  Accessory Clinical Findings    Lab Results  Component Value Date   WBC 8.3 09/02/2022   HGB 14.9 09/02/2022   HCT 44.3 09/02/2022   MCV 91.2 09/02/2022   PLT 245 09/02/2022   Lab Results  Component Value Date   CREATININE 1.31 10/12/2022   BUN 22 10/12/2022   NA 135 10/12/2022   K 4.8 10/12/2022   CL 105 10/12/2022   CO2 22 10/12/2022   Lab Results  Component Value Date   ALT 27 10/12/2022   AST 36 10/12/2022   ALKPHOS 49 10/12/2022   BILITOT 0.8 10/12/2022   Lab Results  Component Value Date   CHOL 132 10/12/2022   HDL 49.30 10/12/2022   LDLCALC 64 10/12/2022   TRIG 90.0 10/12/2022   CHOLHDL 3 10/12/2022    Lab Results  Component Value Date   HGBA1C 5.9 10/12/2022    Assessment & Plan    1.  Dyspnea on exertion/coronary artery disease: Status post diagnostic catheterization in January 2019 showing a total occlusion of the right coronary artery with left-to-right collaterals.  He was recently seen in March with complaints of progressive dyspnea on exertion,  especially with inclines.  He has not been having any chest pain.  Recent echocardiogram  shows normal LV function without significant valvular disease.  At his last visit, his isosorbide was increased to 60 mg daily.  Unfortunately, Mr. Abegglen has continued to have dyspnea on exertion without any change in symptoms despite titration of nitrate therapy.  He is interested in pursuing diagnostic catheterization given concern for possible anginal equivalent and progression of coronary artery disease.  The patient understands that risks include but are not limited to stroke (1 in 1000), death (1 in 1000), kidney failure [usually temporary] (1 in 500), bleeding (1 in 200), allergic reaction [possibly serious] (1 in 200), and agrees to proceed.  Will arrange for diagnostic catheterization next week with Dr. Okey Dupre.  Continue aspirin, beta-blocker, ARB, statin, Zetia, and nitrate therapy.  2.  Essential hypertension: Well-controlled on beta-blocker, ARB, and nitrate.  3.  Hyperlipidemia: LDL of 64 with normal LFTs on April 30 of this year.  Continue statin and Zetia therapy.  4.  Carotid arterial disease: Status post prior right carotid endarterectomy in 2009.  Last ultrasound in 2019 showed 1-39 bilateral internal carotid artery stenoses.  Christopher bruits on examination.  He is interested in pursuing repeat ultrasound as it has been 5 years since his last 1.  We will arrange.  Continue aspirin, statin, and Zetia.  5.  Asthma: On daily Arnuity with occasional use of rescue inhaler.  Though Arnuity has gotten rid of wheezing, inhalers do not seem to improve dyspnea on exertion.  Followed by pulmonology.  6.  Disposition: Follow-up CBC today (basic metabolic panel performed last week-normal).  Diagnostic catheterization next week with Dr. Okey Dupre.  Follow-up in clinic approximately 2 weeks post catheterization.   Nicolasa Ducking, NP 10/15/2022, 10:54 AM

## 2022-10-15 NOTE — Progress Notes (Signed)
  Office Visit    Patient Name: Christopher Villegas Date of Encounter: 10/15/2022  Primary Care Provider:  Scott, Charlene, MD Primary Cardiologist:  Dallis Darden End, MD  Chief Complaint    67-year-old male with history of CAD, hypertension, hyperlipidemia, carotid arterial disease, asthma, remote tobacco abuse, and GERD, who presents for follow-up related to dyspnea on exertion.  Past Medical History    Past Medical History:  Diagnosis Date   Asthma    CAD (coronary artery disease)    a. 06/2017 Cath: LM 30ost/d, LAD 30m, D1/2 large, nl, D3 small, LCX 20ost, 40p, OM1/2/3 nl, RCA 40/100m - CTO w/ L->R collats. RPDA fills via collats from 1st Septal. EF 55-65%-->Med rx (could consider CTO PCI for angina).   Cancer (HCC)    Basal Cell carcinoma   Carotid arterial disease (HCC)    a. 2009 s/p R CEA; b. 05/2018 U/S: RICA 1-39%, RECA >50%, LICA 1-39%, LECA <50%.   GERD (gastroesophageal reflux disease)    History of colon polyps    History of echocardiogram    a. 10/2015 Echo: EF 55%. Mild TR/MR; b. 10/2022 Echo: EF 55-60%, no rwma, nl diast fxn, nl RV fxn, mildly dil LA, no significant valvular disease.   Hyperlipidemia    Hypertension    Past Surgical History:  Procedure Laterality Date   BICEPS TENDON REPAIR  2021   CAROTID ENDARTERECTOMY Right 2009   COLONOSCOPY WITH PROPOFOL N/A 06/16/2017   Procedure: COLONOSCOPY WITH PROPOFOL;  Surgeon: Wohl, Darren, MD;  Location: [facility];  Service: Endoscopy;  Laterality: N/A;   COLONOSCOPY WITH PROPOFOL N/A 06/22/2022   Procedure: COLONOSCOPY WITH PROPOFOL;  Surgeon: Wohl, Darren, MD;  Location: [facility];  Service: Endoscopy;  Laterality: N/A;   CYST EXCISION PERINEAL     ESOPHAGOGASTRODUODENOSCOPY (EGD) WITH PROPOFOL N/A 06/16/2017   Procedure: ESOPHAGOGASTRODUODENOSCOPY (EGD) WITH PROPOFOL;  Surgeon: Wohl, Darren, MD;  Location: [facility];  Service: Endoscopy;  Laterality: N/A;   LEFT HEART CATH AND CORONARY  ANGIOGRAPHY N/A 07/01/2017   Procedure: LEFT HEART CATH AND CORONARY ANGIOGRAPHY;  Surgeon: End, Alferd Obryant, MD;  Location: [facility];  Service: Cardiovascular;  Laterality: N/A;   POLYPECTOMY  06/16/2017   Procedure: POLYPECTOMY;  Surgeon: Wohl, Darren, MD;  Location: [facility];  Service: Endoscopy;;   TONSILLECTOMY AND ADENOIDECTOMY      Allergies  Allergies  Allergen Reactions   Plavix [Clopidogrel] Rash    History of Present Illness    67-year-old male with above past medical history including CAD, hypertension, hyperlipidemia, carotid arterial disease, asthma, remote tobacco abuse, and GERD.  He is status post right carotid endarterectomy in 2009 with 1 to 39% bilateral internal carotid artery stenoses on most recent carotid ultrasound in December 2019.  Diagnostic catheterization January 2019 in the setting of angina showed chronic total occlusion of the mid right coronary artery with left to right collaterals.  Otherwise a diffuse, nonobstructive disease and he was medically managed.  Mr. Kielbasa was last seen in cardiology clinic in March of this year, at which time he reported dyspnea on exertion that was recalcitrant to use of Arnuity and rescue inhaler.  He also reported a pinched nerve in his neck leading to right arm discomfort, numbness, and weakness, for which he has been seen by neurosurgery [place].  2D echocardiogram was performed and showed normal LV function without significant valvular abnormalities.  Isosorbide was also increased to 60 mg daily.  Since his last visit, Mr. Romero   has continued to experience dyspnea on exertion.  He has not noticed any significant improvement despite titration of isosorbide.  He does not experience chest pain.  He notes that if he walks more slowly, he is able to participate in activities like mowing his lawn, but any incline results and significant dyspnea.  He denies palpitations, PND, orthopnea, dizziness, syncope, edema, or  early satiety.  He is interested in pursuing diagnostic catheterization, as he would like to get back to a regular exercise regimen but is concerned about his heart.  Home Medications    Current Outpatient Medications  Medication Sig Dispense Refill   albuterol (VENTOLIN HFA) 108 (90 Base) MCG/ACT inhaler Inhale 2 puffs into the lungs every 6 (six) hours as needed for wheezing or shortness of breath. 18 g 1   aspirin EC 81 MG tablet Take 81 mg by mouth every other day. Swallow whole.     ezetimibe (ZETIA) 10 MG tablet TAKE 1 TABLET BY MOUTH DAILY 90 tablet 1   Fluticasone Furoate (ARNUITY ELLIPTA) 100 MCG/ACT AEPB Inhale 1 puff into the lungs daily in the afternoon. 30 each 5   isosorbide mononitrate (IMDUR) 60 MG 24 hr tablet Take 1 tablet (60 mg total) by mouth daily. 90 tablet 1   losartan (COZAAR) 50 MG tablet TAKE 1 TABLET BY MOUTH DAILY 30 tablet 11   metoprolol tartrate (LOPRESSOR) 25 MG tablet Take 1 tablet (25 mg total) by mouth 2 (two) times daily. 180 tablet 0   rosuvastatin (CRESTOR) 10 MG tablet TAKE 1 TABLET BY MOUTH DAILY 90 tablet 0   No current facility-administered medications for this visit.    Family History    Family History  Problem Relation Age of Onset   Cancer Mother        breast   Hypertension Mother    Hyperlipidemia Mother    Arthritis Mother        osteo   Hypertension Father    Heart disease Father        CHF   Kidney failure Father        causing death   COPD Father    Alcohol abuse Father    Autoimmune disease Sister    Lupus Sister    Healthy Son    Healthy Son     Social History    Social History   Socioeconomic History   Marital status: Married    Spouse name: Beth   Number of children: 2   Years of education: 16   Highest education level: Bachelor's degree (e.g., BA, AB, BS)  Occupational History   Occupation: [employer]  Tobacco Use   Smoking status: Former    Packs/day: 1.50    Years: 45.00     Additional pack years: 0.00    Total pack years: 67.50    Types: Cigarettes    Quit date: 09/29/2014    Years since quitting: 8.0    Passive exposure: Past   Smokeless tobacco: Never  Vaping Use   Vaping Use: Never used  Substance and Sexual Activity   Alcohol use: No    Comment: Former Alcoholic - sober since 03/20/1989   Drug use: No   Sexual activity: Yes  Other Topics Concern   Not on file  Social History Narrative   Not on file   Social Determinants of Health   Financial Resource Strain: Low Risk  (10/09/2022)   Overall Financial Resource Strain (CARDIA)    Difficulty of Paying   Living Expenses: Not hard at all  Food Insecurity: No Food Insecurity (10/09/2022)   Hunger Vital Sign    Worried About Running Out of Food in the Last Year: Never true    Ran Out of Food in the Last Year: Never true  Transportation Needs: No Transportation Needs (10/09/2022)   PRAPARE - Transportation    Lack of Transportation (Medical): No    Lack of Transportation (Non-Medical): No  Physical Activity: Insufficiently Active (10/09/2022)   Exercise Vital Sign    Days of Exercise per Week: 2 days    Minutes of Exercise per Session: 30 min  Stress: No Stress Concern Present (10/09/2022)   [employer] - Occupational Stress Questionnaire    Feeling of Stress : Not at all  Social Connections: Unknown (10/09/2022)   Social Connection and Isolation Panel [NHANES]    Frequency of Communication with Friends and Family: More than three times a week    Frequency of Social Gatherings with Friends and Family: Once a week    Attends Religious Services: Patient declined    Active Member of Clubs or Organizations: Patient declined    Attends Club or Organization Meetings: Not on file    Marital Status: Married  Intimate Partner Violence: Not on file    Review of Systems    Ongoing dyspnea on exertion.  He denies chest pain, palpitations, PND, orthopnea, dizziness, syncope, edema,  or early satiety.  All other systems reviewed and are otherwise negative except as noted above.   Physical Exam    VS:  BP 98/60 (BP Location: Left Arm, Patient Position: Sitting, Cuff Size: Large)   Pulse 66   Ht 5' 9" (1.753 m)   Wt 214 lb 6 oz (97.2 kg)   SpO2 97%   BMI 31.66 kg/m  , BMI Body mass index is 31.66 kg/m.     GEN: Well nourished, well developed, in no acute distress. HEENT: normal. Neck: Supple, no JVD, carotid bruits, or masses. Cardiac: RRR, no murmurs, rubs, or gallops. No clubbing, cyanosis, edema.  Radials 2+/PT 2+ and equal bilaterally.  Respiratory:  Respirations regular and unlabored, clear to auscultation bilaterally. GI: Soft, nontender, nondistended, BS + x 4. MS: no deformity or atrophy. Skin: warm and dry, no rash. Neuro:  Strength and sensation are intact. Psych: Normal affect.  Accessory Clinical Findings    Lab Results  Component Value Date   WBC 8.3 09/02/2022   HGB 14.9 09/02/2022   HCT 44.3 09/02/2022   MCV 91.2 09/02/2022   PLT 245 09/02/2022   Lab Results  Component Value Date   CREATININE 1.31 10/12/2022   BUN 22 10/12/2022   NA 135 10/12/2022   K 4.8 10/12/2022   CL 105 10/12/2022   CO2 22 10/12/2022   Lab Results  Component Value Date   ALT 27 10/12/2022   AST 36 10/12/2022   ALKPHOS 49 10/12/2022   BILITOT 0.8 10/12/2022   Lab Results  Component Value Date   CHOL 132 10/12/2022   HDL 49.30 10/12/2022   LDLCALC 64 10/12/2022   TRIG 90.0 10/12/2022   CHOLHDL 3 10/12/2022    Lab Results  Component Value Date   HGBA1C 5.9 10/12/2022    Assessment & Plan    1.  Dyspnea on exertion/coronary artery disease: Status post diagnostic catheterization in January 2019 showing a total occlusion of the right coronary artery with left-to-right collaterals.  He was recently seen in March with complaints of progressive dyspnea on exertion,   especially with inclines.  He has not been having any chest pain.  Recent echocardiogram  shows normal LV function without significant valvular disease.  At his last visit, his isosorbide was increased to 60 mg daily.  Unfortunately, Mr. Keeble has continued to have dyspnea on exertion without any change in symptoms despite titration of nitrate therapy.  He is interested in pursuing diagnostic catheterization given concern for possible anginal equivalent and progression of coronary artery disease.  The patient understands that risks include but are not limited to stroke (1 in 1000), death (1 in 1000), kidney failure [usually temporary] (1 in 500), bleeding (1 in 200), allergic reaction [possibly serious] (1 in 200), and agrees to proceed.  Will arrange for diagnostic catheterization next week with Dr. End.  Continue aspirin, beta-blocker, ARB, statin, Zetia, and nitrate therapy.  2.  Essential hypertension: Well-controlled on beta-blocker, ARB, and nitrate.  3.  Hyperlipidemia: LDL of 64 with normal LFTs on April 30 of this year.  Continue statin and Zetia therapy.  4.  Carotid arterial disease: Status post prior right carotid endarterectomy in 2009.  Last ultrasound in 2019 showed 1-39 bilateral internal carotid artery stenoses.  No bruits on examination.  He is interested in pursuing repeat ultrasound as it has been 5 years since his last 1.  We will arrange.  Continue aspirin, statin, and Zetia.  5.  Asthma: On daily Arnuity with occasional use of rescue inhaler.  Though Arnuity has gotten rid of wheezing, inhalers do not seem to improve dyspnea on exertion.  Followed by pulmonology.  6.  Disposition: Follow-up CBC today (basic metabolic panel performed last week-normal).  Diagnostic catheterization next week with Dr. End.  Follow-up in clinic approximately 2 weeks post catheterization.   Sakai Heinle, NP 10/15/2022, 10:54 AM  

## 2022-10-18 ENCOUNTER — Encounter: Payer: Self-pay | Admitting: Internal Medicine

## 2022-10-18 NOTE — Assessment & Plan Note (Signed)
Seeing cardiology as outlined.  Planning for echo tomorrow.

## 2022-10-18 NOTE — Assessment & Plan Note (Signed)
carotid artery stenosis status post right carotid endarterectomy. Continue risk factor modification.  Continues on crestor and zetia. Continue aspirin.  

## 2022-10-18 NOTE — Assessment & Plan Note (Signed)
Avoid antiinflammatories.  Continue losartan.  Follow metabolic panel.  

## 2022-10-18 NOTE — Assessment & Plan Note (Signed)
Has seen Dr Craige Cotta.  Has albuterol to take prn.  On arnuity. Breathing stable.

## 2022-10-18 NOTE — Assessment & Plan Note (Signed)
Low cholesterol diet and exercise.  On crestor and zetia.  Follow lipid panel and liver function tests.  

## 2022-10-18 NOTE — Assessment & Plan Note (Signed)
Documented elevation.  Check met b and A1c. Low carb diet and exercise.  

## 2022-10-18 NOTE — Assessment & Plan Note (Signed)
Saw Dr Myer Haff. MRI cervical spine from June 30, 2022 shows C7-T1 disc herniation which causes effacement of the right C8 nerve root.  There is moderate bilateral foraminal stenosis.  At C6-7, there is moderate to severe left-sided foraminal stenosis due to uncovertebral hypertrophy and disc herniation.  Discussed surgery. Desired PT. Reevaluated 10/07/22 - felt to have maximized conservative management.  Discussed surgery.  He wants to hold.

## 2022-10-18 NOTE — Assessment & Plan Note (Signed)
Continue losartan.  Follow pressures.  Follow metabolic panel.  

## 2022-10-18 NOTE — Assessment & Plan Note (Signed)
Colonoscopy 06/2017 - Three 2 to 4 mm polyps in the descending colon. One 3 mm polyp in the sigmoid colon.  Recommended f/u 5 years.  Colonoscopy 06/2022 - one 2mm polyp transverse colon and diverticulosis.  Recommended f/u colonoscopy in 7 years.

## 2022-10-18 NOTE — Assessment & Plan Note (Addendum)
History of coronary artery disease with chronic total occlusion of the mid RCA managed medically.  Sees Dr End.  Continue aspirin, crestor and zetia.  Saw Dr End 09/02/22 - recommended echo and increase isosorbide to 60mg  q day.  ECHO planned for tomorrow.

## 2022-10-19 DIAGNOSIS — L821 Other seborrheic keratosis: Secondary | ICD-10-CM | POA: Diagnosis not present

## 2022-10-19 DIAGNOSIS — Z85828 Personal history of other malignant neoplasm of skin: Secondary | ICD-10-CM | POA: Diagnosis not present

## 2022-10-19 DIAGNOSIS — L814 Other melanin hyperpigmentation: Secondary | ICD-10-CM | POA: Diagnosis not present

## 2022-10-19 DIAGNOSIS — L570 Actinic keratosis: Secondary | ICD-10-CM | POA: Diagnosis not present

## 2022-10-20 ENCOUNTER — Encounter
Admission: RE | Disposition: A | Discharge status: Home / Self Care | Payer: Self-pay | Source: Home / Self Care | Attending: Internal Medicine

## 2022-10-20 ENCOUNTER — Encounter: Payer: Self-pay | Admitting: Internal Medicine

## 2022-10-20 ENCOUNTER — Other Ambulatory Visit: Payer: Self-pay

## 2022-10-20 ENCOUNTER — Ambulatory Visit
Admission: RE | Admit: 2022-10-20 | Discharge: 2022-10-20 | Disposition: A | Discharge status: Home / Self Care | Payer: Medicare HMO | Attending: Internal Medicine | Admitting: Internal Medicine

## 2022-10-20 DIAGNOSIS — R0602 Shortness of breath: Secondary | ICD-10-CM | POA: Insufficient documentation

## 2022-10-20 DIAGNOSIS — I10 Essential (primary) hypertension: Secondary | ICD-10-CM | POA: Diagnosis not present

## 2022-10-20 DIAGNOSIS — I779 Disorder of arteries and arterioles, unspecified: Secondary | ICD-10-CM | POA: Insufficient documentation

## 2022-10-20 DIAGNOSIS — K219 Gastro-esophageal reflux disease without esophagitis: Secondary | ICD-10-CM | POA: Diagnosis not present

## 2022-10-20 DIAGNOSIS — J45909 Unspecified asthma, uncomplicated: Secondary | ICD-10-CM | POA: Insufficient documentation

## 2022-10-20 DIAGNOSIS — I25118 Atherosclerotic heart disease of native coronary artery with other forms of angina pectoris: Secondary | ICD-10-CM | POA: Insufficient documentation

## 2022-10-20 DIAGNOSIS — Z7982 Long term (current) use of aspirin: Secondary | ICD-10-CM | POA: Insufficient documentation

## 2022-10-20 DIAGNOSIS — Z87891 Personal history of nicotine dependence: Secondary | ICD-10-CM | POA: Diagnosis not present

## 2022-10-20 DIAGNOSIS — Z79899 Other long term (current) drug therapy: Secondary | ICD-10-CM | POA: Insufficient documentation

## 2022-10-20 DIAGNOSIS — E785 Hyperlipidemia, unspecified: Secondary | ICD-10-CM | POA: Insufficient documentation

## 2022-10-20 DIAGNOSIS — R0609 Other forms of dyspnea: Secondary | ICD-10-CM

## 2022-10-20 DIAGNOSIS — R079 Chest pain, unspecified: Secondary | ICD-10-CM

## 2022-10-20 HISTORY — PX: RIGHT/LEFT HEART CATH AND CORONARY ANGIOGRAPHY: CATH118266

## 2022-10-20 LAB — POCT I-STAT 7, (LYTES, BLD GAS, ICA,H+H)
Acid-base deficit: 4 mmol/L — ABNORMAL HIGH (ref 0.0–2.0)
Bicarbonate: 21.7 mmol/L (ref 20.0–28.0)
Calcium, Ion: 1.22 mmol/L (ref 1.15–1.40)
HCT: 39 % (ref 39.0–52.0)
Hemoglobin: 13.3 g/dL (ref 13.0–17.0)
O2 Saturation: 94 %
Potassium: 4.3 mmol/L (ref 3.5–5.1)
Sodium: 139 mmol/L (ref 135–145)
TCO2: 23 mmol/L (ref 22–32)
pCO2 arterial: 39.1 mmHg (ref 32–48)
pH, Arterial: 7.353 (ref 7.35–7.45)
pO2, Arterial: 73 mmHg — ABNORMAL LOW (ref 83–108)

## 2022-10-20 SURGERY — RIGHT/LEFT HEART CATH AND CORONARY ANGIOGRAPHY
Anesthesia: Moderate Sedation | Laterality: Bilateral

## 2022-10-20 MED ORDER — HEPARIN (PORCINE) IN NACL 1000-0.9 UT/500ML-% IV SOLN
INTRAVENOUS | Status: AC
  Filled 2022-10-20: qty 1000

## 2022-10-20 MED ORDER — FENTANYL CITRATE (PF) 100 MCG/2ML IJ SOLN
INTRAMUSCULAR | Status: DC | PRN
  Administered 2022-10-20: 25 ug via INTRAVENOUS

## 2022-10-20 MED ORDER — ONDANSETRON HCL 4 MG/2ML IJ SOLN: 4.0000 mg | Freq: Four times a day (QID) | INTRAMUSCULAR | Status: DC | PRN

## 2022-10-20 MED ORDER — HEPARIN SODIUM (PORCINE) 1000 UNIT/ML IJ SOLN
INTRAMUSCULAR | Status: DC | PRN
  Administered 2022-10-20: 4500 [IU] via INTRAVENOUS

## 2022-10-20 MED ORDER — SODIUM CHLORIDE 0.9% FLUSH: 3.0000 mL | Freq: Two times a day (BID) | INTRAVENOUS | Status: DC

## 2022-10-20 MED ORDER — SODIUM CHLORIDE 0.9 % IV SOLN: 250.0000 mL | INTRAVENOUS | Status: DC | PRN

## 2022-10-20 MED ORDER — LIDOCAINE HCL (PF) 1 % IJ SOLN
INTRAMUSCULAR | Status: DC | PRN
  Administered 2022-10-20: 5 mL

## 2022-10-20 MED ORDER — SODIUM CHLORIDE 0.9 % IV SOLN: INTRAVENOUS | Status: DC

## 2022-10-20 MED ORDER — ASPIRIN 81 MG PO CHEW: 81.0000 mg | CHEWABLE_TABLET | ORAL | Status: DC

## 2022-10-20 MED ORDER — FENTANYL CITRATE (PF) 100 MCG/2ML IJ SOLN
INTRAMUSCULAR | Status: AC
  Filled 2022-10-20: qty 2

## 2022-10-20 MED ORDER — HEPARIN SODIUM (PORCINE) 1000 UNIT/ML IJ SOLN
INTRAMUSCULAR | Status: AC
  Filled 2022-10-20: qty 10

## 2022-10-20 MED ORDER — MIDAZOLAM HCL 2 MG/2ML IJ SOLN
INTRAMUSCULAR | Status: DC | PRN
  Administered 2022-10-20: 1 mg via INTRAVENOUS

## 2022-10-20 MED ORDER — ACETAMINOPHEN 325 MG PO TABS: 650.0000 mg | ORAL_TABLET | ORAL | Status: DC | PRN

## 2022-10-20 MED ORDER — HEPARIN (PORCINE) IN NACL 1000-0.9 UT/500ML-% IV SOLN
INTRAVENOUS | Status: DC | PRN
  Administered 2022-10-20: 1000 mL

## 2022-10-20 MED ORDER — IOHEXOL 300 MG/ML  SOLN
INTRAMUSCULAR | Status: DC | PRN
  Administered 2022-10-20: 38 mL

## 2022-10-20 MED ORDER — MIDAZOLAM HCL 2 MG/2ML IJ SOLN
INTRAMUSCULAR | Status: AC
  Filled 2022-10-20: qty 2

## 2022-10-20 MED ORDER — LABETALOL HCL 5 MG/ML IV SOLN: 10.0000 mg | INTRAVENOUS | Status: DC | PRN

## 2022-10-20 MED ORDER — VERAPAMIL HCL 2.5 MG/ML IV SOLN
INTRAVENOUS | Status: DC | PRN
  Administered 2022-10-20 (×2): 2.5 mg via INTRA_ARTERIAL

## 2022-10-20 MED ORDER — HYDRALAZINE HCL 20 MG/ML IJ SOLN: 10.0000 mg | INTRAMUSCULAR | Status: DC | PRN

## 2022-10-20 MED ORDER — SODIUM CHLORIDE 0.9% FLUSH: 3.0000 mL | INTRAVENOUS | Status: DC | PRN

## 2022-10-20 MED ORDER — VERAPAMIL HCL 2.5 MG/ML IV SOLN
INTRAVENOUS | Status: AC
  Filled 2022-10-20: qty 2

## 2022-10-20 SURGICAL SUPPLY — 13 items
CATH 5F 110X4 TIG (CATHETERS) IMPLANT
CATH BALLN WEDGE 5F 110CM (CATHETERS) IMPLANT
DEVICE RAD TR BAND REGULAR (VASCULAR PRODUCTS) IMPLANT
DRAPE BRACHIAL (DRAPES) IMPLANT
GLIDESHEATH SLEND SS 6F .021 (SHEATH) IMPLANT
GUIDEWIRE EMER 3M J .025X150CM (WIRE) IMPLANT
GUIDEWIRE INQWIRE 1.5J.035X260 (WIRE) IMPLANT
INQWIRE 1.5J .035X260CM (WIRE) ×1
PACK CARDIAC CATH (CUSTOM PROCEDURE TRAY) ×1 IMPLANT
PROTECTION STATION PRESSURIZED (MISCELLANEOUS) ×1
SET ATX SIMPLICITY (MISCELLANEOUS) IMPLANT
SHEATH GLIDE SLENDER 4/5FR (SHEATH) IMPLANT
STATION PROTECTION PRESSURIZED (MISCELLANEOUS) IMPLANT

## 2022-10-20 NOTE — Discharge Instructions (Signed)
Right Heart Cath, Care After This sheet gives you information about how to care for yourself after your procedure. Your health care provider may also give you more specific instructions. If you have problems or questions, contact your health care provider. What can I expect after the procedure? After the procedure, it is common to have: Bruising or mild discomfort in the area where the IV was inserted (insertion site). Follow these instructions at home: Eating and drinking  You may eat and drink after your procedure.  Drink a lot of fluids for the first several days after the procedure, as directed by your health care provider. This helps to wash (flush) the contrast out of your body. Examples of healthy fluids include water or low-calorie drinks. General instructions Check your IV insertion area and also your venous access site every day for signs of infection. Check for: Redness, swelling, or pain. Fluid or blood. Warmth. Pus or a bad smell. Take over-the-counter and prescription medicines only as told by your health care provider. Rest and return to your normal activities as told by your health care provider. Ask your health care provider what activities are safe for you. Do not drive for 24 hours if you were given a medicine to help you relax (sedative), or until your health care provider approves. Keep all follow-up visits as told by your health care provider. This is important. Contact a health care provider if: Your skin becomes itchy or you develop a rash or hives. You have a fever that does not get better with medicine. You feel nauseous. You vomit. You have redness, swelling, or pain around the insertion site. You have fluid or blood coming from the insertion site. Your insertion area feels warm to the touch. You have pus or a bad smell coming from the insertion site. Get help right away if: You have difficulty breathing or shortness of breath. You develop chest pain. You  faint. You feel very dizzy. These symptoms may represent a serious problem that is an emergency. Do not wait to see if the symptoms will go away. Get medical help right away. Call your local emergency services (911 in the [place] Do not drive yourself to the hospital. Summary After your procedure, it is common to have bruising or mild discomfort in the area where the IV was inserted. You should check your IV insertion area every day for signs of infection. Take over-the-counter and prescription medicines only as told by your health care provider. You should drink a lot of fluids for the first several days after the procedure to help flush the contrast from your body. This information is not intended to replace advice given to you by your health care provider. Make sure you discuss any questions you have with your health care provider. Document Released: 03/21/2013 Document Revised: 05/13/2017 Document Reviewed: 04/24/2016 Elsevier Patient Education  2020 [employer].  Keep right wrist elevated on pillow above the heart for today.  Watch right wrist for evidence of bleeding or hematoma.. If bleeding or hematoma noted, hold pressure over the site for at least 15 minutes and notify the physician.  No blending or flexing of the wrist--no lifting for the remainder of the day or for 2 weeks after your procedure. Notify the physician for evidence of infection or if you get a temperature.    Radial Site Care Refer to this sheet in the next few weeks. These instructions provide you with information about caring for yourself after your procedure. Your health care  provider may also give you more specific instructions. Your treatment has been planned according to current medical practices, but problems sometimes occur. Call your health care provider if you have any problems or questions after your procedure. What can I expect after the procedure? After your procedure, it is typical to have the  following: Bruising at the radial site that usually fades within 1-2 weeks. Blood collecting in the tissue (hematoma) that may be painful to the touch. It should usually decrease in size and tenderness within 1-2 weeks.  Follow these instructions at home: Take medicines only as directed by your health care provider. If you are on a medication called Metformin please do not take for 48 hours after your procedure. Over the next 48hrs please increase your fluid intake of water and non caffeine beverages to flush the contrast dye out of your system.  You may shower 24 hours after the procedure  Leave your bandage on and gently wash the site with plain soap and water. Pat the area dry with a clean towel. Do not rub the site, because this may cause bleeding.  Remove your dressing 48hrs after your procedure and leave open to air.  Do not submerge your site in water for 7 days. This includes swimming and washing dishes.  Check your insertion site every day for redness, swelling, or drainage. Do not apply powder or lotion to the site. Do not flex or bend the affected arm for 24 hours or as directed by your health care provider. Do not push or pull heavy objects with the affected arm for 24 hours or as directed by your health care provider. Do not lift over 10 lb (4.5 kg) for 5 days after your procedure or as directed by your health care provider. Ask your health care provider when it is okay to: Return to work or school. Resume usual physical activities or sports. Resume sexual activity. Do not drive home if you are discharged the same day as the procedure. Have someone else drive you. You may drive 48 hours after the procedure Do not operate machinery or power tools for 24 hours after the procedure. If your procedure was done as an outpatient procedure, which means that you went home the same day as your procedure, a responsible adult should be with you for the first 24 hours after you arrive home. Keep  all follow-up visits as directed by your health care provider. This is important. Contact a health care provider if: You have a fever. You have chills. You have increased bleeding from the radial site. Hold pressure on the site. Get help right away if: You have unusual pain at the radial site. You have redness, warmth, or swelling at the radial site. You have drainage (other than a small amount of blood on the dressing) from the radial site. The radial site is bleeding, and the bleeding does not stop after 15 minutes of holding steady pressure on the site. Your arm or hand becomes pale, cool, tingly, or numb. This information is not intended to replace advice given to you by your health care provider. Make sure you discuss any questions you have with your health care provider. Document Released: 07/03/2010 Document Revised: 11/06/2015 Document Reviewed: 12/17/2013 Elsevier Interactive Patient Education  2018 [employer].

## 2022-10-20 NOTE — Interval H&P Note (Signed)
History and Physical Interval Note:  10/20/2022 12:42 PM  Christopher Villegas  has presented today for surgery, with the diagnosis of coronary artery disease and shortness of breath.  The various methods of treatment have been discussed with the patient and family. After consideration of risks, benefits and other options for treatment, the patient has consented to  Procedure(s): RIGHT/LEFT HEART CATH AND CORONARY ANGIOGRAPHY (Bilateral) as a surgical intervention.  The patient's history has been reviewed, patient examined, no change in status, stable for surgery.  I have reviewed the patient's chart and labs.  Questions were answered to the patient's satisfaction.    Cath Lab Visit (complete for each Cath Lab visit)  Clinical Evaluation Leading to the Procedure:   ACS: No.  Non-ACS:    Anginal Classification: CCS Villegas  Anti-ischemic medical therapy: Maximal Therapy (2 or more classes of medications)  Non-Invasive Test Results: No non-invasive testing performed  Prior CABG: No previous CABG  Tamiki Kuba

## 2022-10-21 LAB — POCT I-STAT EG7
Acid-base deficit: 3 mmol/L — ABNORMAL HIGH (ref 0.0–2.0)
Bicarbonate: 22.8 mmol/L (ref 20.0–28.0)
Calcium, Ion: 1.21 mmol/L (ref 1.15–1.40)
HCT: 40 % (ref 39.0–52.0)
Hemoglobin: 13.6 g/dL (ref 13.0–17.0)
O2 Saturation: 73 %
Potassium: 4.3 mmol/L (ref 3.5–5.1)
Sodium: 140 mmol/L (ref 135–145)
TCO2: 24 mmol/L (ref 22–32)
pCO2, Ven: 43.4 mmHg — ABNORMAL LOW (ref 44–60)
pH, Ven: 7.329 (ref 7.25–7.43)
pO2, Ven: 41 mmHg (ref 32–45)

## 2022-11-01 ENCOUNTER — Telehealth: Payer: Self-pay | Admitting: Neurosurgery

## 2022-11-01 ENCOUNTER — Encounter: Payer: Self-pay | Admitting: Internal Medicine

## 2022-11-01 NOTE — Telephone Encounter (Signed)
Called pt to notify him of pre-op appt, pt stated he actually wanted to postpone his surgery because pain was improving.

## 2022-11-01 NOTE — Telephone Encounter (Signed)
Notified OR to cancel surgery. Canceled post op appts

## 2022-11-05 ENCOUNTER — Ambulatory Visit: Payer: Medicare HMO | Admitting: Internal Medicine

## 2022-11-11 ENCOUNTER — Ambulatory Visit: Payer: Medicare HMO | Attending: Nurse Practitioner | Admitting: Nurse Practitioner

## 2022-11-11 ENCOUNTER — Encounter: Payer: Self-pay | Admitting: Nurse Practitioner

## 2022-11-11 VITALS — BP 118/60 | HR 72 | Ht 69.0 in | Wt 214.2 lb

## 2022-11-11 DIAGNOSIS — I10 Essential (primary) hypertension: Secondary | ICD-10-CM

## 2022-11-11 DIAGNOSIS — I6523 Occlusion and stenosis of bilateral carotid arteries: Secondary | ICD-10-CM

## 2022-11-11 DIAGNOSIS — R0609 Other forms of dyspnea: Secondary | ICD-10-CM

## 2022-11-11 DIAGNOSIS — I2510 Atherosclerotic heart disease of native coronary artery without angina pectoris: Secondary | ICD-10-CM | POA: Diagnosis not present

## 2022-11-11 DIAGNOSIS — E785 Hyperlipidemia, unspecified: Secondary | ICD-10-CM

## 2022-11-11 NOTE — Progress Notes (Signed)
Office Visit    Patient Name: Christopher Villegas Date of Encounter: 11/11/2022  Primary Care Provider:  Dale Mingus, MD Primary Cardiologist:  Christopher Kendall, MD  Chief Complaint    67 year old male with history of CAD, hypertension, hyperlipidemia, carotid arterial disease, asthma, remote tobacco abuse, and GERD, presents for follow-up after recent diagnostic catheterization.  Past Medical History    Past Medical History:  Diagnosis Date   Asthma    CAD (coronary artery disease)    a. 06/2017 Cath: LM 30ost/d, LAD 42m, D1/2 large, nl, D3 small, LCX 20ost, 40p, OM1/2/3 nl, RCA 40/163m - CTO w/ L->R collats. RPDA fills via collats from 1st Septal. EF 55-65%-->Med rx; b. 10/2022 Cath: 10/2022 Cath: LM 30ost/d, LAD 54m, LCx 25ost, 20p, RCA 105m, 124m CTO, RPDA fills via 1st septal. Nl R heart pressures-->Med Rx.   Cancer [facility])    Basal Cell carcinoma   Carotid arterial disease (HCC)    a. 2009 s/p R CEA; b. 05/2018 U/S: RICA 1-39%, RECA >50%, LICA 1-39%, LECA <50%.   GERD (gastroesophageal reflux disease)    History of colon polyps    History of echocardiogram    a. 10/2015 Echo: EF 55%. Mild TR/MR; b. 10/2022 Echo: EF 55-60%, no rwma, nl diast fxn, nl RV fxn, mildly dil LA, no significant valvular disease.   Hyperlipidemia    Hypertension    Past Surgical History:  Procedure Laterality Date   BICEPS TENDON REPAIR  2021   CAROTID ENDARTERECTOMY Right 2009   COLONOSCOPY WITH PROPOFOL N/A 06/16/2017   Procedure: COLONOSCOPY WITH PROPOFOL;  Surgeon: Christopher Minium, MD;  Location: [facility];  Service: Endoscopy;  Laterality: N/A;   COLONOSCOPY WITH PROPOFOL N/A 06/22/2022   Procedure: COLONOSCOPY WITH PROPOFOL;  Surgeon: Christopher Minium, MD;  Location: [facility];  Service: Endoscopy;  Laterality: N/A;   CYST EXCISION PERINEAL     ESOPHAGOGASTRODUODENOSCOPY (EGD) WITH PROPOFOL N/A 06/16/2017   Procedure: ESOPHAGOGASTRODUODENOSCOPY (EGD) WITH PROPOFOL;  Surgeon: Christopher Minium, MD;  Location: [facility];  Service: Endoscopy;  Laterality: N/A;   LEFT HEART CATH AND CORONARY ANGIOGRAPHY N/A 07/01/2017   Procedure: LEFT HEART CATH AND CORONARY ANGIOGRAPHY;  Surgeon: Christopher Kendall, MD;  Location: [facility] IN[facility] CV LAB;  Service: Cardiovascular;  Laterality: N/A;   POLYPECTOMY  06/16/2017   Procedure: POLYPECTOMY;  Surgeon: Christopher Minium, MD;  Location: [facility];  Service: Endoscopy;;   RIGHT/LEFT HEART CATH AND CORONARY ANGIOGRAPHY Bilateral 10/20/2022   Procedure: RIGHT/LEFT HEART CATH AND CORONARY ANGIOGRAPHY;  Surgeon: Christopher Kendall, MD;  Location: [facility] INVASIVE CV LAB;  Service: Cardiovascular;  Laterality: Bilateral;   TONSILLECTOMY AND ADENOIDECTOMY      Allergies  Allergies  Allergen Reactions   Plavix [Clopidogrel] Rash    History of Present Illness    67 year old male with above past medical history including CAD, hypertension, hyperlipidemia, carotid arterial disease, asthma, remote tobacco abuse, and GERD. He is status post right carotid endarterectomy in 2009 with 1 to 39% bilateral internal carotid artery stenoses on most recent carotid ultrasound in December 2019. Diagnostic catheterization January 2019 in the setting of angina showed chronic total occlusion of the mid right coronary artery with left to right collaterals. Otherwise a diffuse, nonobstructive disease and he was medically managed.  Echocardiogram early 2024 the setting of dyspnea normal LV function without significant valvular abnormalities.  In the setting of ongoing dyspnea despite titration of isosorbide therapy, we opted to pursue diagnostic catheterization, which was performed Oct 20, 2022 showing a chronic total occlusion of the mid right coronary artery with left-to-right collaterals otherwise mild to moderate, nonobstructive CAD.  Medical therapy was recommended.  Since his catheterization, [name] notes significant peace of mind this allowed him to get back  into exercising more frequently.  He has been getting in greater than 150 minutes of activity a week, often through cycling, and with this, he has noted steady improvement in his activity tolerance.  His dyspnea seems to be improving and he has not been experiencing chest pain.  He denies palpitations, PND, orthopnea, dizziness, syncope, edema, or early satiety.  Home Medications    Current Outpatient Medications  Medication Sig Dispense Refill   aspirin EC 81 MG tablet Take 81 mg by mouth every other day. Swallow whole.     ezetimibe (ZETIA) 10 MG tablet TAKE 1 TABLET BY MOUTH DAILY 90 tablet 1   Fluticasone Furoate (ARNUITY ELLIPTA) 100 MCG/ACT AEPB Inhale 1 puff into the lungs daily in the afternoon. 30 each 5   isosorbide mononitrate (IMDUR) 60 MG 24 hr tablet Take 1 tablet (60 mg total) by mouth daily. 90 tablet 1   losartan (COZAAR) 50 MG tablet TAKE 1 TABLET BY MOUTH DAILY 30 tablet 11   metoprolol tartrate (LOPRESSOR) 25 MG tablet Take 1 tablet (25 mg total) by mouth 2 (two) times daily. 180 tablet 0   nitroGLYCERIN (NITROSTAT) 0.4 MG SL tablet Place 1 tablet (0.4 mg total) under the tongue every 5 (five) minutes as needed for chest pain. 25 tablet 0   rosuvastatin (CRESTOR) 10 MG tablet TAKE 1 TABLET BY MOUTH DAILY 90 tablet 0   albuterol (VENTOLIN HFA) 108 (90 Base) MCG/ACT inhaler Inhale 2 puffs into the lungs every 6 (six) hours as needed for wheezing or shortness of breath. (Patient not taking: Reported on 10/20/2022) 18 g 1   No current facility-administered medications for this visit.     Review of Systems    Ongoing dyspnea on exertion however, this is improving.  He denies chest pain, palpitations, PND, orthopnea, dizziness, syncope, edema, or early satiety..  All other systems reviewed and are otherwise negative except as noted above.    Physical Exam    VS:  BP 118/60 (BP Location: Left Arm, Patient Position: Sitting, Cuff Size: Normal)   Pulse 72   Ht 5\' 9"  (1.753 m)    Wt 214 lb 3.2 oz (97.2 kg)   SpO2 96%   BMI 31.63 kg/m  , BMI Body mass index is 31.63 kg/m.     GEN: Well nourished, well developed, in no acute distress. HEENT: normal. Neck: Supple, no JVD, carotid bruits, or masses. Cardiac: RRR, no murmurs, rubs, or gallops. No clubbing, cyanosis, edema.  Radials 2+/PT 2+ and equal bilaterally.  Right radial catheterization site without bleeding, bruit, or hematoma. Respiratory:  Respirations regular and unlabored, clear to auscultation bilaterally. GI: Soft, nontender, nondistended, BS + x 4. MS: no deformity or atrophy. Skin: warm and dry, no rash. Neuro:  Strength and sensation are intact. Psych: Normal affect.  Accessory Clinical Findings    ECG personally reviewed by me today -regular sinus rhythm, 72- no acute changes.  Lab Results  Component Value Date   WBC 7.6 10/15/2022   HGB 13.3 10/20/2022   HCT 39.0 10/20/2022   MCV 89.7 10/15/2022   PLT 231 10/15/2022   Lab Results  Component Value Date   CREATININE 1.31 10/12/2022   BUN 22 10/12/2022   NA 139 10/20/2022  K 4.3 10/20/2022   CL 105 10/12/2022   CO2 22 10/12/2022   Lab Results  Component Value Date   ALT 27 10/12/2022   AST 36 10/12/2022   ALKPHOS 49 10/12/2022   BILITOT 0.8 10/12/2022   Lab Results  Component Value Date   CHOL 132 10/12/2022   HDL 49.30 10/12/2022   LDLCALC 64 10/12/2022   TRIG 90.0 10/12/2022   CHOLHDL 3 10/12/2022    Lab Results  Component Value Date   HGBA1C 5.9 10/12/2022    Assessment & Plan    1.  Dyspnea on exertion/CAD: Known chronic total occlusion of the right coronary artery with left-to-right collaterals dating back to 2000.  In the setting of progressive dyspnea with recent echo showing normal LV function, and ongoing symptoms despite titration of nitrate therapy, he underwent diagnostic catheterization in April, which showed overall stable anatomy.  With this finding, he notes significant relief and peace of mind.  He has  been increasing his exercise to address his deconditioning, and has noted improved activity tolerance with reduction in dyspnea on exertion.  He is now exercising about 150 minutes a week.  He has not been experiencing chest pain.  2.  Essential HTN:  well-controlled on ? blocker, ARB, and nitrate rx.  3.  HL:  LDL of 64 w/ nl LFTs 4/30.  Cont statin/zetia.  4.  Carotid arterial dzs:  No bruits.  For carotid U/S next week.  Cont asa, statin, zetia.  5.  Disposition:  F/u in 3-4 mos or sooner if necessary.  Nicolasa Ducking, NP 11/11/2022, 5:00 PM

## 2022-11-11 NOTE — Patient Instructions (Signed)
Medication Instructions:  No changes *If you need a refill on your cardiac medications before your next appointment, please call your pharmacy*   Lab Work: None ordered If you have labs (blood work) drawn today and your tests are completely normal, you will receive your results only by: MyChart Message (if you have MyChart) OR A paper copy in the mail If you have any lab test that is abnormal or we need to change your treatment, we will call you to review the results.   Testing/Procedures: None ordered   Follow-Up: At [facility], you and your health needs are our priority.  As part of our continuing mission to provide you with exceptional heart care, we have created designated Provider Care Teams.  These Care Teams include your primary Cardiologist (physician) and Advanced Practice Providers (APPs -  Physician Assistants and Nurse Practitioners) who all work together to provide you with the care you need, when you need it.  We recommend signing up for the patient portal called "MyChart".  Sign up information is provided on this After Visit Summary.  MyChart is used to connect with patients for Virtual Visits (Telemedicine).  Patients are able to view lab/test results, encounter notes, upcoming appointments, etc.  Non-urgent messages can be sent to your provider as well.   To learn more about what you can do with MyChart, go to [email or web address].    Your next appointment:   4 month(s)  Provider:   You may see Yvonne Kendall, MD or one of the following Advanced Practice Providers on your designated Care Team:   Nicolasa Ducking, NP Eula Listen, PA-C Cadence Fransico Michael, PA-C Charlsie Quest, NP

## 2022-11-12 ENCOUNTER — Other Ambulatory Visit: Payer: Self-pay | Admitting: Internal Medicine

## 2022-11-16 ENCOUNTER — Ambulatory Visit: Payer: Medicare HMO | Attending: Nurse Practitioner

## 2022-11-16 DIAGNOSIS — I6523 Occlusion and stenosis of bilateral carotid arteries: Secondary | ICD-10-CM | POA: Diagnosis not present

## 2022-11-16 DIAGNOSIS — I6529 Occlusion and stenosis of unspecified carotid artery: Secondary | ICD-10-CM

## 2022-11-17 ENCOUNTER — Other Ambulatory Visit: Payer: Self-pay | Admitting: *Deleted

## 2022-11-17 DIAGNOSIS — I6523 Occlusion and stenosis of bilateral carotid arteries: Secondary | ICD-10-CM

## 2022-11-20 ENCOUNTER — Other Ambulatory Visit: Payer: Self-pay | Admitting: Internal Medicine

## 2022-11-22 ENCOUNTER — Inpatient Hospital Stay: Admission: RE | Admit: 2022-11-22 | Payer: Medicare HMO | Source: Ambulatory Visit

## 2022-12-03 ENCOUNTER — Ambulatory Visit: Admit: 2022-12-03 | Payer: Medicare HMO | Admitting: Neurosurgery

## 2022-12-03 SURGERY — FORAMINOTOMY 1 LEVEL
Anesthesia: General | Laterality: Right

## 2022-12-08 ENCOUNTER — Encounter: Payer: BC Managed Care – PPO | Admitting: Family Medicine

## 2022-12-08 ENCOUNTER — Other Ambulatory Visit: Payer: Self-pay | Admitting: Internal Medicine

## 2022-12-15 ENCOUNTER — Encounter: Payer: Medicare HMO | Admitting: Orthopedic Surgery

## 2022-12-21 ENCOUNTER — Other Ambulatory Visit: Payer: Self-pay | Admitting: Internal Medicine

## 2023-01-05 ENCOUNTER — Ambulatory Visit (HOSPITAL_BASED_OUTPATIENT_CLINIC_OR_DEPARTMENT_OTHER): Payer: Medicare HMO | Admitting: Pulmonary Disease

## 2023-01-05 ENCOUNTER — Encounter (HOSPITAL_BASED_OUTPATIENT_CLINIC_OR_DEPARTMENT_OTHER): Payer: Self-pay | Admitting: Pulmonary Disease

## 2023-01-05 VITALS — BP 118/70 | HR 64 | Resp 16 | Ht 68.5 in | Wt 209.2 lb

## 2023-01-05 DIAGNOSIS — R0609 Other forms of dyspnea: Secondary | ICD-10-CM | POA: Diagnosis not present

## 2023-01-05 DIAGNOSIS — J4520 Mild intermittent asthma, uncomplicated: Secondary | ICD-10-CM

## 2023-01-05 DIAGNOSIS — J45990 Exercise induced bronchospasm: Secondary | ICD-10-CM

## 2023-01-05 DIAGNOSIS — Z87891 Personal history of nicotine dependence: Secondary | ICD-10-CM | POA: Diagnosis not present

## 2023-01-05 NOTE — Progress Notes (Signed)
[facility]  Chief Complaint  Patient presents with   Follow-up    3 month follow up, seems better to a certain degree but he still has DOE, He doesn't think Arnuity is doing anything.     Constitutional:  BP 118/70   Pulse 64   Resp 16   Ht 5' 8.5" (1.74 m)   Wt 209 lb 3.2 oz (94.9 kg)   SpO2 96%   BMI 31.34 kg/m   Past Medical History:  CAD, GERD, HTN, HLD  Past Surgical History:  He  has a past surgical history that includes Tonsillectomy and adenoidectomy; Cyst excision perineal; Carotid endarterectomy (Right, 2009); Colonoscopy with propofol (N/A, 06/16/2017); Esophagogastroduodenoscopy (egd) with propofol (N/A, 06/16/2017); polypectomy (06/16/2017); LEFT HEART CATH AND CORONARY ANGIOGRAPHY (N/A, 07/01/2017); Biceps tendon repair (2021); Colonoscopy with propofol (N/A, 06/22/2022); and RIGHT/LEFT HEART CATH AND CORONARY ANGIOGRAPHY (Bilateral, 10/20/2022).  Brief Summary:  Christopher Villegas is a 67 y.o. male former smoker with asthma and emphysema.      Subjective:   Echo was okay.  He had LHC.  No change from 2019 with total occlusion of RCA, and recommended to continue medical management.  He started exercising more and this helped with his energy level and breathing.  He has lost about 10 lbs since his last visit in April 2024.  Not having chest pain.  Only needs to use albuterol before he does strenuous exercise or when he is walking in very hot, humid weather.  Sleeping okay.  Not having cough or wheeze.  He isn't sure if arnuity is helping.  Physical Exam:   Appearance - well kempt   ENMT - no sinus tenderness, no oral exudate, no LAN, Mallampati 3 airway, no stridor  Respiratory - equal breath sounds bilaterally, no wheezing or rales  CV - s1s2 regular rate and rhythm, no murmurs  Ext - no clubbing, no edema  Skin - no rashes  Psych - normal mood and affect     Pulmonary testing:  PFT 02/09/21 >> FEV1 3.22  (102%), FEV1% 74, TLC 7.77 (117%), RV 3.60 (159%), DLCO 89%, +BD  Chest Imaging:  LDCT chest 04/29/20 >> small nodules up to 3.7 mm, mild bronchial wall thickening, mild centrilobular and paraseptal emphysema LDCT 05/07/21 >> mild emphysema, b/l nodules up to 3.5 mm LDCT chest 05/07/22 >> no change  Cardiac testing:  [facility] 07/01/17 >> severe single vessel CAD with chronic total occlusion of RCA, mild/mod CAD LMCA/LAD/LCx Echo 10/13/22 >> EF 55 to 60%  Social History:  He  reports that he quit smoking about 8 years ago. His smoking use included cigarettes. He started smoking about 53 years ago. He has a 67.5 pack-year smoking history. He has been exposed to tobacco smoke. He has never used smokeless tobacco. He reports that he does not drink alcohol and does not use drugs.  Family History:  His family history includes Alcohol abuse in his father; Arthritis in his mother; Autoimmune disease in his sister; COPD in his father; Cancer in his mother; Healthy in his son and son; Heart disease in his father; Hyperlipidemia in his mother; Hypertension in his father and mother; Kidney failure in his father; Lupus in his sister.     Assessment/Plan:   Mild intermittent asthma, Emphysema. - leg cramps from breztri and dulera - breo didn't seem to help - he isn't sure if arnuity is helping either; will see how he does off of maintenance inhalers - continue  prn albuterol - discussed symptoms to monitor for that would indicate a need to resume maintenance inhaler therapy  History of tobacco abuse. - follow up low dose CT chest scheduled for November 2024  Exercised induced asthma. - high intensity, brief warm up before exercising - he can use albuterol as needed before exercising  Dyspnea on exertion. - likely combination of asthma, obesity and deconditioning - recent cardiac assessment was stable - improved with increased exercise activity and weight loss; encouraged him to keep up with  this  Coronary artery disease. - followed by Dr. Cristal Deer End with cardiology  Time Spent Involved in Patient Care on Day of Examination:  26 minutes  Follow up:   Patient Instructions  Follow up in 4 months  Medication List:   Allergies as of 01/05/2023       Reactions   Plavix [clopidogrel] Rash        Medication List        Accurate as of January 05, 2023 10:46 AM. If you have any questions, ask your nurse or doctor.          STOP taking these medications    Arnuity Ellipta 100 MCG/ACT Aepb Generic drug: Fluticasone Furoate Stopped by: Coralyn Helling       TAKE these medications    albuterol 108 (90 Base) MCG/ACT inhaler Commonly known as: VENTOLIN HFA Inhale 2 puffs into the lungs every 6 (six) hours as needed for wheezing or shortness of breath.   aspirin EC 81 MG tablet Take 81 mg by mouth every other day. Swallow whole.   ezetimibe 10 MG tablet Commonly known as: ZETIA TAKE 1 TABLET BY MOUTH DAILY   isosorbide mononitrate 60 MG 24 hr tablet Commonly known as: IMDUR Take 1 tablet (60 mg total) by mouth daily.   losartan 50 MG tablet Commonly known as: COZAAR TAKE 1 TABLET BY MOUTH DAILY   metoprolol tartrate 25 MG tablet Commonly known as: LOPRESSOR TAKE 1 TABLET BY MOUTH TWICE DAILY   nitroGLYCERIN 0.4 MG SL tablet Commonly known as: NITROSTAT Place 1 tablet (0.4 mg total) under the tongue every 5 (five) minutes as needed for chest pain.   rosuvastatin 10 MG tablet Commonly known as: CRESTOR TAKE 1 TABLET BY MOUTH DAILY        Signature:  Coralyn Helling, MD [facility] Pager - [phone] 01/05/2023, 10:46 AM

## 2023-01-05 NOTE — Patient Instructions (Signed)
Follow up in 4 months 

## 2023-01-11 ENCOUNTER — Encounter: Payer: Medicare HMO | Admitting: Neurosurgery

## 2023-01-24 DIAGNOSIS — Z6830 Body mass index (BMI) 30.0-30.9, adult: Secondary | ICD-10-CM | POA: Diagnosis not present

## 2023-01-24 DIAGNOSIS — J209 Acute bronchitis, unspecified: Secondary | ICD-10-CM | POA: Diagnosis not present

## 2023-02-11 IMAGING — CT CT CHEST LUNG CANCER SCREENING LOW DOSE W/O CM
1 of 4 series · 15 of 39 positions shown, 19 images · non-contrast
Comparison: 04/29/2020

CLINICAL DATA: Ex-smoker, quitting 7 years ago. Thirty pack-year
history.

EXAM:
CT CHEST WITHOUT CONTRAST LOW-DOSE FOR LUNG CANCER SCREENING
TECHNIQUE: Multidetector CT imaging of the chest was performed following the
standard protocol without IV contrast.

[Series 3: lung 1.00 · axial · 0.73mm/px · z∈[-1219,-895]mm · 15 of 358 slices shown, 19 images]
[im 17/358  mediastinal]
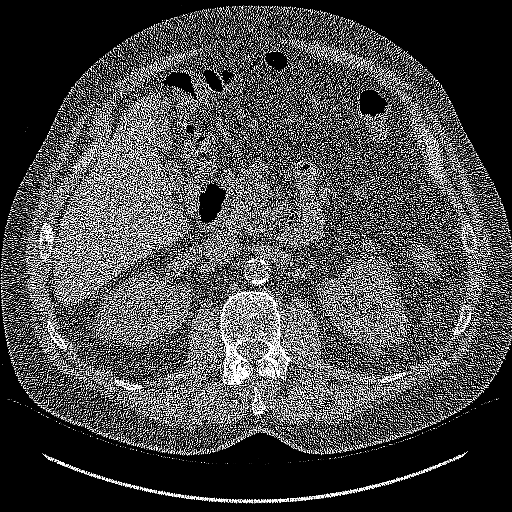
[im 17/358  lung]
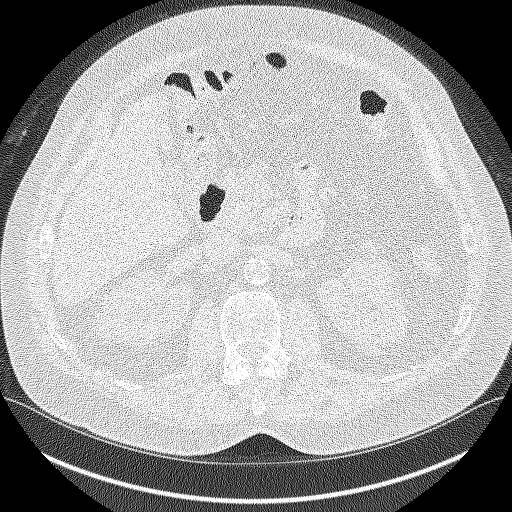
[im 49/358  lung]
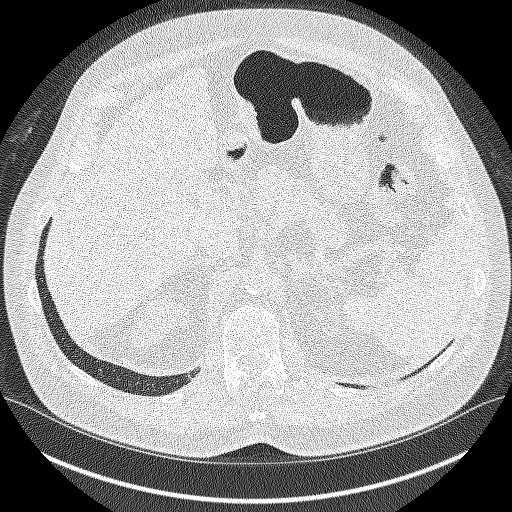
[im 65/358  lung]
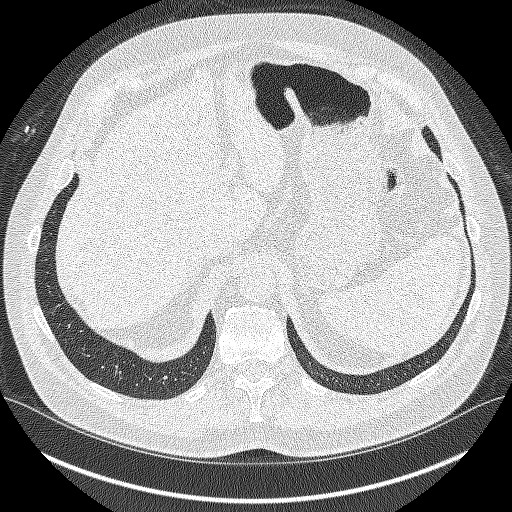
[im 82/358  lung]
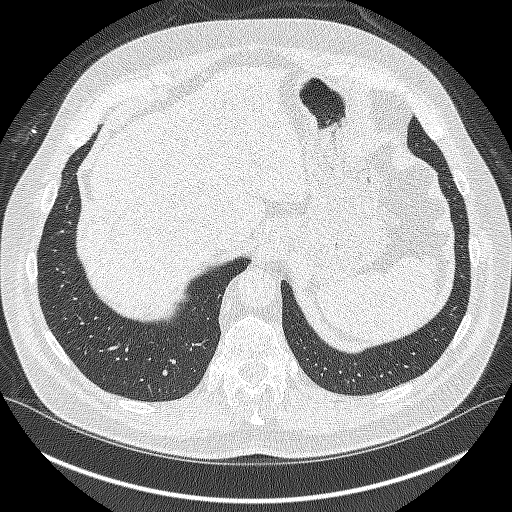
[im 114/358  mediastinal]
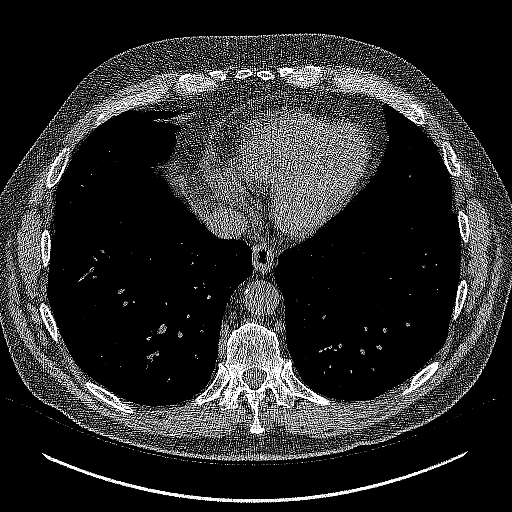
[im 114/358  lung]
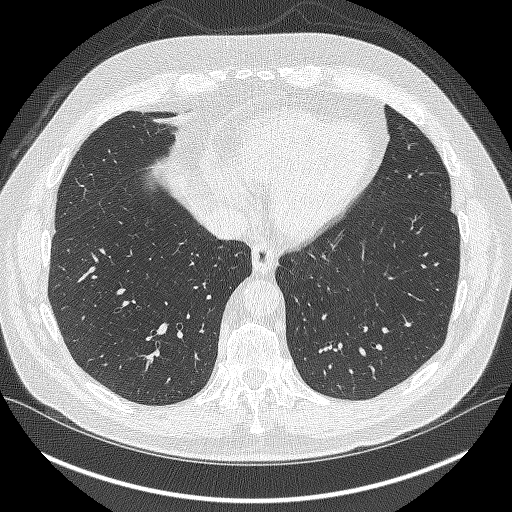
[im 130/358  lung]
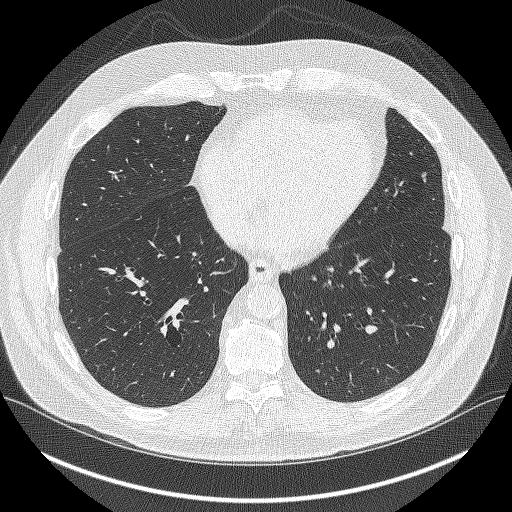
[im 163/358  lung]
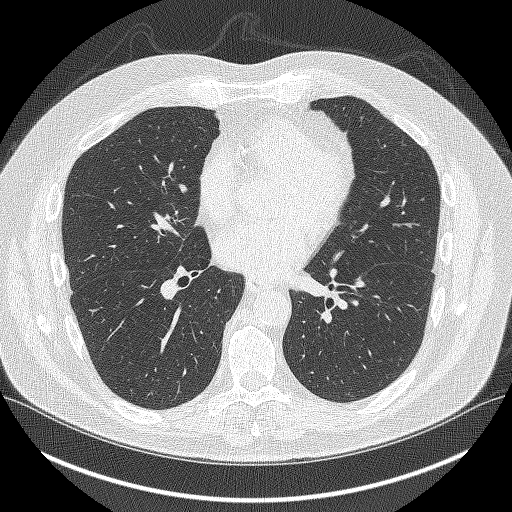
[im 179/358  lung]
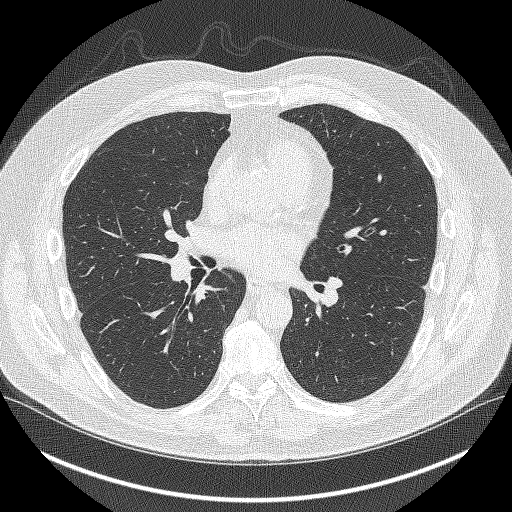
[im 195/358  mediastinal]
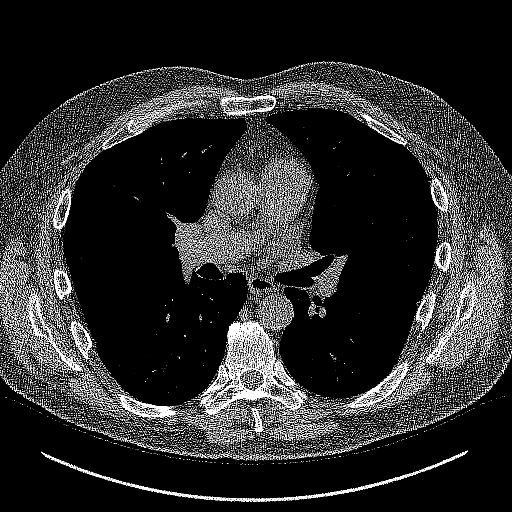
[im 195/358  lung]
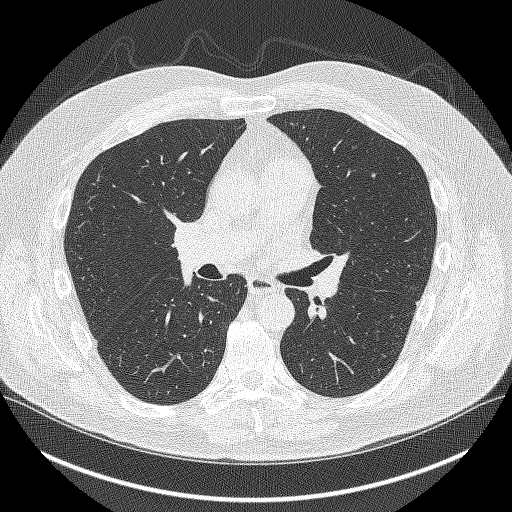
[im 228/358  lung]
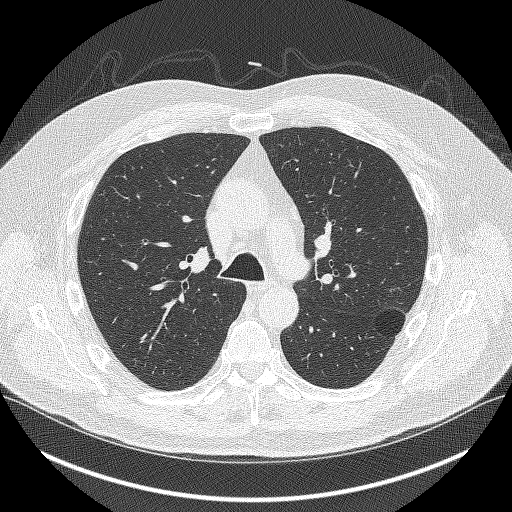
[im 244/358  lung]
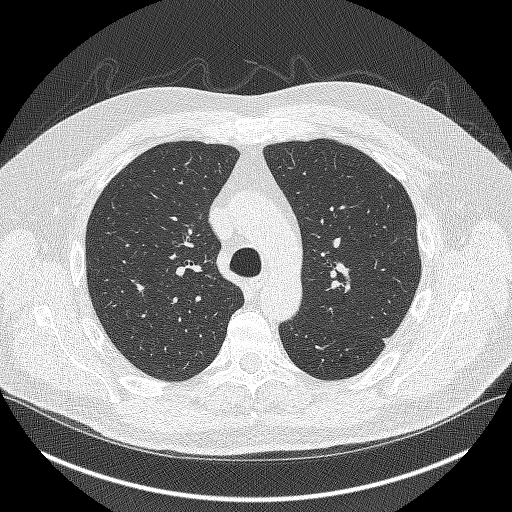
[im 276/358  lung]
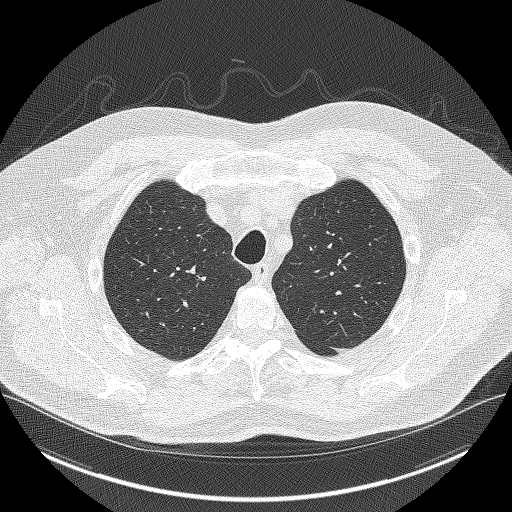
[im 293/358  mediastinal]
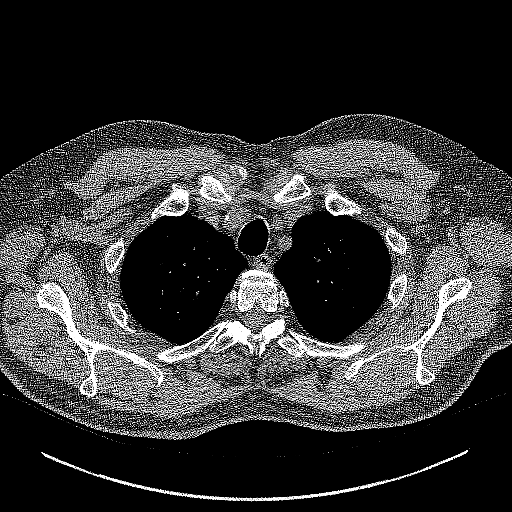
[im 293/358  lung]
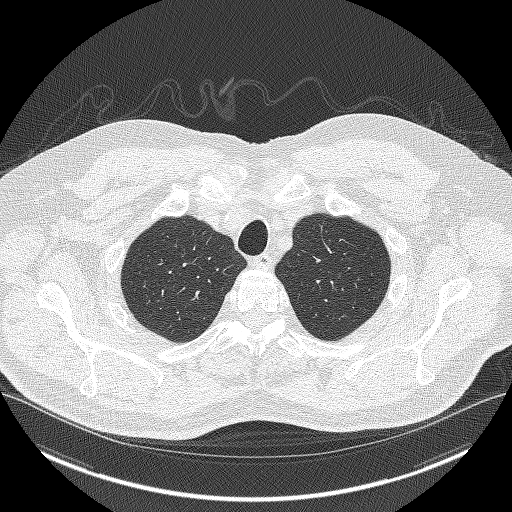
[im 309/358  lung]
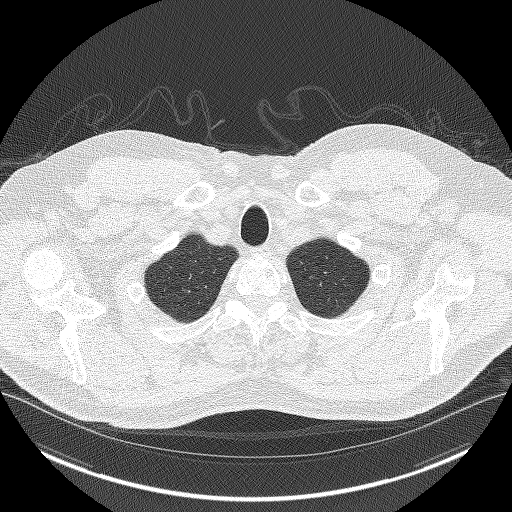
[im 341/358  lung]
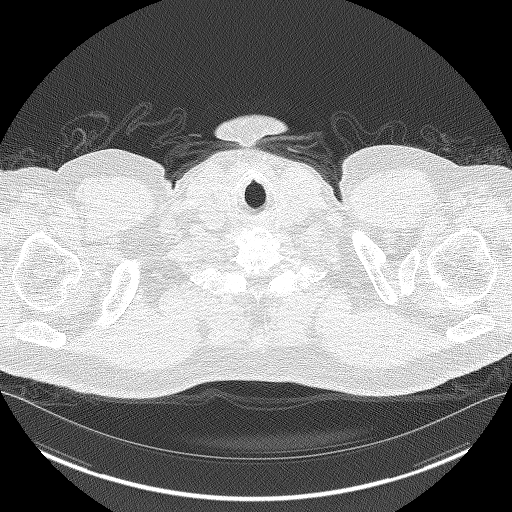

[15 of 39 positions shown; findings below may reference images not displayed]

FINDINGS: Cardiovascular: Aortic atherosclerosis. Normal heart size, without
pericardial effusion. Left main and 3 vessel coronary artery
calcification.

Mediastinum/Nodes: No mediastinal or definite hilar adenopathy,
given limitations of unenhanced CT.

Lungs/Pleura: No pleural fluid. Mild centrilobular emphysema. No
change in bilateral pulmonary nodules of maximally volume derived
equivalent diameter 3.5 mm.

Upper Abdomen: Incompletely imaged gallbladder with possible tiny
stone versus artifact within. Normal imaged portions of the liver,
spleen, stomach, pancreas, adrenal glands, kidneys.

Musculoskeletal: Remote left seventh posterolateral rib fracture.
Lower cervical spondylosis.
IMPRESSION: 1. Lung-RADS 2, benign appearance or behavior. Continue annual
screening with low-dose chest CT without contrast in 12 months.
2. Aortic Atherosclerosis (VPWDZ-729.9) and Emphysema ([id number]-SDV.Y).
Coronary artery atherosclerosis.
3. Artifact versus tiny gallstone.

## 2023-02-12 ENCOUNTER — Other Ambulatory Visit: Payer: Self-pay | Admitting: Internal Medicine

## 2023-02-24 ENCOUNTER — Encounter: Payer: Medicare HMO | Admitting: Orthopedic Surgery

## 2023-03-02 ENCOUNTER — Other Ambulatory Visit: Payer: Self-pay | Admitting: Internal Medicine

## 2023-03-02 DIAGNOSIS — I10 Essential (primary) hypertension: Secondary | ICD-10-CM

## 2023-03-03 ENCOUNTER — Encounter: Payer: Self-pay | Admitting: Internal Medicine

## 2023-03-03 NOTE — Telephone Encounter (Signed)
Ok to refill

## 2023-03-03 NOTE — Telephone Encounter (Signed)
Ok to refill under your name? Old PCP was filling previously.

## 2023-03-04 ENCOUNTER — Other Ambulatory Visit: Payer: Self-pay | Admitting: Internal Medicine

## 2023-03-16 ENCOUNTER — Ambulatory Visit: Payer: Medicare HMO | Attending: Internal Medicine | Admitting: Internal Medicine

## 2023-03-16 ENCOUNTER — Encounter: Payer: Self-pay | Admitting: Internal Medicine

## 2023-03-16 VITALS — BP 112/68 | HR 63 | Ht 69.0 in | Wt 212.0 lb

## 2023-03-16 DIAGNOSIS — I10 Essential (primary) hypertension: Secondary | ICD-10-CM | POA: Diagnosis not present

## 2023-03-16 DIAGNOSIS — I6523 Occlusion and stenosis of bilateral carotid arteries: Secondary | ICD-10-CM

## 2023-03-16 DIAGNOSIS — I25118 Atherosclerotic heart disease of native coronary artery with other forms of angina pectoris: Secondary | ICD-10-CM

## 2023-03-16 DIAGNOSIS — E785 Hyperlipidemia, unspecified: Secondary | ICD-10-CM

## 2023-03-16 NOTE — Patient Instructions (Signed)

## 2023-03-16 NOTE — Progress Notes (Signed)
Cardiology Office Note:  .   Date:  03/16/2023  ID:  Christopher Villegas, DOB Mar 11, 1956, MRN [id number] PCP: Dale Tunkhannock, MD  [facility] Providers Cardiologist:  Yvonne Kendall, MD     History of Present Illness: .   Christopher Villegas is a 67 y.o. male with coronary artery disease, hypertension, hyperlipidemia, carotid artery stenosis, asthma, GERD, and remote tobacco use, who presents for follow-up of coronary artery disease.  He presented to our office in early May for follow-up of exertional dyspnea concerning for his anginal equivalent.  He subsequently underwent right and left heart catheterization that demonstrated stable appearance of the coronary arteries with CTO of the mid RCA and left-to-right collaterals again noted.  Right and left heart filling pressures and cardiac output were normal.  At his return visit in late May, Christopher Villegas was feeling better.  He noted that the stable cath results gave him peace of mind and allowed him to begin exercising more.  He felt as though his dyspnea was already improving.  Today, Christopher Villegas reports that he continues to feel well.  His exertional dyspnea has improved further.  He does some exercise on a daily basis, usually stretches and some walking.  He is not biking as much as he used to.  He sometimes will use his albuterol inhaler before going on a walk and feels like this helps him breathe better.  He denies chest pain, palpitations, lightheadedness, and edema.  He is tolerating his medications well.  ROS: See HPI  Studies Reviewed: .       Carotid Doppler (11/16/2022): No significant disease involving either internal carotid artery.  Greater than 50% stenoses of both external carotid arteries noted.  Antegrade flow in the vertebral arteries with normal flow hemodynamics in both subclavian arteries.  R/LHC (10/20/2022): Stable appearance of the coronary arteries compared to prior catheterization in 2019.  There is still chronic  total occlusion of the mid RCA with left-to-right collaterals as well as mild-moderate, nonobstructive left coronary artery disease. Normal left and right heart filling pressures. Normal Fick cardiac output/index.  Risk Assessment/Calculations:             Physical Exam:   VS:  BP 112/68 (BP Location: Left Arm, Patient Position: Sitting, Cuff Size: Large)   Pulse 63   Ht 5\' 9"  (1.753 m)   Wt 212 lb (96.2 kg)   SpO2 95%   BMI 31.31 kg/m    Wt Readings from Last 3 Encounters:  03/16/23 212 lb (96.2 kg)  01/05/23 209 lb 3.2 oz (94.9 kg)  11/11/22 214 lb 3.2 oz (97.2 kg)    General:  NAD. Neck: No JVD or HJR. Lungs: Clear to auscultation bilaterally without wheezes or crackles. Heart: Regular rate and rhythm without murmurs, rubs, or gallops. Abdomen: Soft, nontender, nondistended. Extremities: Trace pretibial edema bilaterally.  ASSESSMENT AND PLAN: .    Coronary artery disease with stable angina: Christopher Villegas continues to do well without any chest pain and improving exertional dyspnea.  He has known CTO of the mid RCA, which we will continue to manage medically with his antianginal regimen consisting of isosorbide mononitrate and metoprolol tartrate.  Continue aspirin, ezetimibe, and rosuvastatin for secondary prevention.  Hypertension: Blood pressure well-controlled today.  No medication changes at this time.  Hyperlipidemia: Lipids well-controlled on last check in April.  Continue current regimen of ezetimibe and rosuvastatin.  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  Carotid artery stenosis: No symptoms.  Stable findings on most recent carotid Doppler in June.  We will plan for follow-up carotid Doppler next June.      Dispo: Return to clinic in 6 months.  Signed, Yvonne Kendall, MD

## 2023-03-21 ENCOUNTER — Other Ambulatory Visit: Payer: Self-pay | Admitting: Internal Medicine

## 2023-04-13 ENCOUNTER — Ambulatory Visit: Payer: Medicare HMO | Admitting: Internal Medicine

## 2023-04-13 ENCOUNTER — Encounter: Payer: Self-pay | Admitting: Internal Medicine

## 2023-04-13 VITALS — BP 112/70 | HR 66 | Temp 98.0°F | Resp 16 | Ht 69.0 in | Wt 213.4 lb

## 2023-04-13 DIAGNOSIS — I25118 Atherosclerotic heart disease of native coronary artery with other forms of angina pectoris: Secondary | ICD-10-CM

## 2023-04-13 DIAGNOSIS — Z125 Encounter for screening for malignant neoplasm of prostate: Secondary | ICD-10-CM

## 2023-04-13 DIAGNOSIS — R739 Hyperglycemia, unspecified: Secondary | ICD-10-CM

## 2023-04-13 DIAGNOSIS — J45909 Unspecified asthma, uncomplicated: Secondary | ICD-10-CM | POA: Diagnosis not present

## 2023-04-13 DIAGNOSIS — R233 Spontaneous ecchymoses: Secondary | ICD-10-CM | POA: Diagnosis not present

## 2023-04-13 DIAGNOSIS — F17201 Nicotine dependence, unspecified, in remission: Secondary | ICD-10-CM

## 2023-04-13 DIAGNOSIS — I10 Essential (primary) hypertension: Secondary | ICD-10-CM

## 2023-04-13 DIAGNOSIS — N1831 Chronic kidney disease, stage 3a: Secondary | ICD-10-CM | POA: Diagnosis not present

## 2023-04-13 DIAGNOSIS — Z860100 Personal history of colon polyps, unspecified: Secondary | ICD-10-CM

## 2023-04-13 DIAGNOSIS — M542 Cervicalgia: Secondary | ICD-10-CM | POA: Diagnosis not present

## 2023-04-13 DIAGNOSIS — M79671 Pain in right foot: Secondary | ICD-10-CM

## 2023-04-13 DIAGNOSIS — E785 Hyperlipidemia, unspecified: Secondary | ICD-10-CM | POA: Diagnosis not present

## 2023-04-13 DIAGNOSIS — Z0000 Encounter for general adult medical examination without abnormal findings: Secondary | ICD-10-CM

## 2023-04-13 DIAGNOSIS — I6523 Occlusion and stenosis of bilateral carotid arteries: Secondary | ICD-10-CM

## 2023-04-13 DIAGNOSIS — M79672 Pain in left foot: Secondary | ICD-10-CM

## 2023-04-13 LAB — BASIC METABOLIC PANEL
BUN: 19 mg/dL (ref 6–23)
CO2: 26 meq/L (ref 19–32)
Calcium: 9.2 mg/dL (ref 8.4–10.5)
Chloride: 101 meq/L (ref 96–112)
Creatinine, Ser: 1.36 mg/dL (ref 0.40–1.50)
GFR: 53.31 mL/min — ABNORMAL LOW (ref >60.00)
Glucose, Bld: 99 mg/dL (ref 70–99)
Potassium: 4.6 meq/L (ref 3.5–5.1)
Sodium: 135 meq/L (ref 135–145)

## 2023-04-13 LAB — LIPID PANEL
Cholesterol: 137 mg/dL (ref 0–200)
HDL: 51.1 mg/dL (ref >39.00)
LDL Cholesterol: 66 mg/dL (ref 0–99)
NonHDL: 85.6
Total CHOL/HDL Ratio: 3
Triglycerides: 97 mg/dL (ref 0.0–149.0)
VLDL: 19.4 mg/dL (ref 0.0–40.0)

## 2023-04-13 LAB — HEPATIC FUNCTION PANEL
ALT: 24 U/L (ref 0–53)
AST: 34 U/L (ref 0–37)
Albumin: 4.4 g/dL (ref 3.5–5.2)
Alkaline Phosphatase: 63 U/L (ref 39–117)
Bilirubin, Direct: 0.2 mg/dL (ref 0.0–0.3)
Total Bilirubin: 1.1 mg/dL (ref 0.2–1.2)
Total Protein: 7.3 g/dL (ref 6.0–8.3)

## 2023-04-13 LAB — CBC WITH DIFFERENTIAL/PLATELET
Basophils Absolute: 0.1 10*3/uL (ref 0.0–0.1)
Basophils Relative: 1 % (ref 0.0–3.0)
Eosinophils Absolute: 0.1 10*3/uL (ref 0.0–0.7)
Eosinophils Relative: 1.5 % (ref 0.0–5.0)
HCT: 45.5 % (ref 39.0–52.0)
Hemoglobin: 15.2 g/dL (ref 13.0–17.0)
Lymphocytes Relative: 22.2 % (ref 12.0–46.0)
Lymphs Abs: 2 10*3/uL (ref 0.7–4.0)
MCHC: 33.3 g/dL (ref 30.0–36.0)
MCV: 93 fL (ref 78.0–100.0)
Monocytes Absolute: 1 10*3/uL (ref 0.1–1.0)
Monocytes Relative: 11.1 % (ref 3.0–12.0)
Neutro Abs: 5.8 10*3/uL (ref 1.4–7.7)
Neutrophils Relative %: 64.2 % (ref 43.0–77.0)
Platelets: 276 10*3/uL (ref 150.0–400.0)
RBC: 4.9 Mil/uL (ref 4.22–5.81)
RDW: 13.7 % (ref 11.5–15.5)
WBC: 9.1 10*3/uL (ref 4.0–10.5)

## 2023-04-13 LAB — PSA, MEDICARE: PSA: 1.48 ng/mL (ref 0.10–4.00)

## 2023-04-13 LAB — HEMOGLOBIN A1C: Hgb A1c MFr Bld: 5.8 % (ref 4.6–6.5)

## 2023-04-13 MED ORDER — EZETIMIBE 10 MG PO TABS: 10.0000 mg | ORAL_TABLET | Freq: Every day | ORAL | 1 refills | Status: DC

## 2023-04-13 NOTE — Assessment & Plan Note (Signed)
Physical today 04/13/23. Colonoscopy 06/22/22 - pathology - COLON POLYP, TRANSVERSE; COLD BIOPSY: HYPERPLASTIC POLYP, MILDLY INFLAMED. NEGATIVE FOR DYSPLASIA AND MALIGNANCY. Recommend f/u in 7 years. Check psa today.

## 2023-04-13 NOTE — Progress Notes (Unsigned)
Subjective:    Patient ID: Christopher Villegas, male    DOB: 12-Jan-1956, 67 y.o.   MRN: [id number]  Patient here for  Chief Complaint  Patient presents with   Annual Exam    HPI Here for a physical exam. Had f/u with cardiology 03/16/23 - known CAD. Recommended continuing isosorbide and metoprolol, along with aspirin, zetia and crestor. Had f/u with Dr Craige Cotta 01/05/23 - recommended continue prn albuterol.  Was off maintenance inhalers. F/u low dose CT chest scheduled for 04/2023. Tries to stay active.  No chest pain.  Breathing overall stable.  No abdominal pain or bowel change reported.  Back on maintenance inhaler after having bronchitis.  No increased cough or congestion reported.  He is having increased pain - heels/bottom of feet. Worse when first gets out of bed or gets up after sitting for a while.  Also some intermittent knee issues and feels gait may be affected.     Past Medical History:  Diagnosis Date   Asthma    CAD (coronary artery disease)    a. 06/2017 Cath: LM 30ost/d, LAD 48m, D1/2 large, nl, D3 small, LCX 20ost, 40p, OM1/2/3 nl, RCA 40/172m - CTO w/ L->R collats. RPDA fills via collats from 1st Septal. EF 55-65%-->Med rx; b. 10/2022 Cath: 10/2022 Cath: LM 30ost/d, LAD 56m, LCx 25ost, 20p, RCA 41m, 13m CTO, RPDA fills via 1st septal. Nl R heart pressures-->Med Rx.   Cancer [facility])    Basal Cell carcinoma   Carotid arterial disease (HCC)    a. 2009 s/p R CEA; b. 05/2018 U/S: RICA 1-39%, RECA >50%, LICA 1-39%, LECA <50%.   GERD (gastroesophageal reflux disease)    History of colon polyps    History of echocardiogram    a. 10/2015 Echo: EF 55%. Mild TR/MR; b. 10/2022 Echo: EF 55-60%, no rwma, nl diast fxn, nl RV fxn, mildly dil LA, no significant valvular disease.   Hyperlipidemia    Hypertension    Past Surgical History:  Procedure Laterality Date   BICEPS TENDON REPAIR  2021   CAROTID ENDARTERECTOMY Right 2009   COLONOSCOPY WITH PROPOFOL N/A 06/16/2017   Procedure:  COLONOSCOPY WITH PROPOFOL;  Surgeon: Midge Minium, MD;  Location: [facility];  Service: Endoscopy;  Laterality: N/A;   COLONOSCOPY WITH PROPOFOL N/A 06/22/2022   Procedure: COLONOSCOPY WITH PROPOFOL;  Surgeon: Midge Minium, MD;  Location: [facility] ENDOSCOPY;  Service: Endoscopy;  Laterality: N/A;   CYST EXCISION PERINEAL     ESOPHAGOGASTRODUODENOSCOPY (EGD) WITH PROPOFOL N/A 06/16/2017   Procedure: ESOPHAGOGASTRODUODENOSCOPY (EGD) WITH PROPOFOL;  Surgeon: Midge Minium, MD;  Location: [facility];  Service: Endoscopy;  Laterality: N/A;   LEFT HEART CATH AND CORONARY ANGIOGRAPHY N/A 07/01/2017   Procedure: LEFT HEART CATH AND CORONARY ANGIOGRAPHY;  Surgeon: Yvonne Kendall, MD;  Location: [facility] INVASIVE CV LAB;  Service: Cardiovascular;  Laterality: N/A;   POLYPECTOMY  06/16/2017   Procedure: POLYPECTOMY;  Surgeon: Midge Minium, MD;  Location: [facility];  Service: Endoscopy;;   RIGHT/LEFT HEART CATH AND CORONARY ANGIOGRAPHY Bilateral 10/20/2022   Procedure: RIGHT/LEFT HEART CATH AND CORONARY ANGIOGRAPHY;  Surgeon: Yvonne Kendall, MD;  Location: [facility] INVASIVE CV LAB;  Service: Cardiovascular;  Laterality: Bilateral;   TONSILLECTOMY AND ADENOIDECTOMY     Family History  Problem Relation Age of Onset   Cancer Mother        breast   Hypertension Mother    Hyperlipidemia Mother    Arthritis Mother        osteo  Hypertension Father    Heart disease Father        CHF   Kidney failure Father        causing death   COPD Father    Alcohol abuse Father    Autoimmune disease Sister    Lupus Sister    Healthy Son    Healthy Son    Social History   Socioeconomic History   Marital status: Married    Spouse name: Beth   Number of children: 2   Years of education: 16   Highest education level: Bachelor's degree (e.g., BA, AB, BS)  Occupational History   Occupation: [employer]  Tobacco Use   Smoking status: Former    Current packs/day: 0.00     Average packs/day: 1.5 packs/day for 45.0 years (67.5 ttl pk-yrs)    Types: Cigarettes    Start date: 09/28/1969    Quit date: 09/29/2014    Years since quitting: 8.5    Passive exposure: Past   Smokeless tobacco: Never  Vaping Use   Vaping status: Never Used  Substance and Sexual Activity   Alcohol use: No    Comment: Former Alcoholic - sober since 03/20/1989   Drug use: No   Sexual activity: Yes  Other Topics Concern   Not on file  Social History Narrative   Not on file   Social Determinants of Health   Financial Resource Strain: Low Risk  (10/09/2022)   Overall Financial Resource Strain (CARDIA)    Difficulty of Paying Living Expenses: Not hard at all  Food Insecurity: No Food Insecurity (10/09/2022)   Hunger Vital Sign    Worried About Running Out of Food in the Last Year: Never true    Ran Out of Food in the Last Year: Never true  Transportation Needs: No Transportation Needs (10/09/2022)   PRAPARE - Administrator, Civil Service (Medical): No    Lack of Transportation (Non-Medical): No  Physical Activity: Insufficiently Active (10/09/2022)   Exercise Vital Sign    Days of Exercise per Week: 2 days    Minutes of Exercise per Session: 30 min  Stress: No Stress Concern Present (10/09/2022)   [employer] of Occupational Health - Occupational Stress Questionnaire    Feeling of Stress : Not at all  Social Connections: Unknown (10/09/2022)   Social Connection and Isolation Panel [NHANES]    Frequency of Communication with Friends and Family: More than three times a week    Frequency of Social Gatherings with Friends and Family: Once a week    Attends Religious Services: Patient declined    Database administrator or Organizations: Patient declined    Attends Engineer, structural: Not on file    Marital Status: Married     Review of Systems  Constitutional:  Negative for appetite change and unexpected weight change.  HENT:  Negative for congestion,  sinus pressure and sore throat.   Eyes:  Negative for pain and visual disturbance.  Respiratory:  Negative for cough, chest tightness and shortness of breath.   Cardiovascular:  Negative for chest pain, palpitations and leg swelling.  Gastrointestinal:  Negative for abdominal pain, diarrhea, nausea and vomiting.  Genitourinary:  Negative for difficulty urinating and dysuria.  Musculoskeletal:  Negative for joint swelling and myalgias.       Knee pain and foot pain as outlined.   Skin:  Negative for color change and rash.  Neurological:  Negative for dizziness and headaches.  Hematological:  Negative for adenopathy. Does not bruise/bleed easily.  Psychiatric/Behavioral:  Negative for agitation and dysphoric mood.        Objective:     BP 112/70   Pulse 66   Temp 98 F (36.7 C)   Resp 16   Ht 5\' 9"  (1.753 m)   Wt 213 lb 6.4 oz (96.8 kg)   SpO2 98%   BMI 31.51 kg/m  Wt Readings from Last 3 Encounters:  04/13/23 213 lb 6.4 oz (96.8 kg)  03/16/23 212 lb (96.2 kg)  01/05/23 209 lb 3.2 oz (94.9 kg)    Physical Exam Constitutional:      General: He is not in acute distress.    Appearance: Normal appearance. He is well-developed.  HENT:     Head: Normocephalic and atraumatic.     Right Ear: External ear normal.     Left Ear: External ear normal.  Eyes:     General: No scleral icterus.       Right eye: No discharge.        Left eye: No discharge.     Conjunctiva/sclera: Conjunctivae normal.  Neck:     Thyroid: No thyromegaly.  Cardiovascular:     Rate and Rhythm: Normal rate and regular rhythm.  Pulmonary:     Effort: No respiratory distress.     Breath sounds: Normal breath sounds. No wheezing.  Abdominal:     General: Bowel sounds are normal.     Palpations: Abdomen is soft.     Tenderness: There is no abdominal tenderness.  Musculoskeletal:        General: No swelling or tenderness.     Cervical back: Neck supple. No tenderness.     Comments: Minimal discomfort -  plantar surface of feet.    Lymphadenopathy:     Cervical: No cervical adenopathy.  Skin:    Findings: No erythema or rash.  Neurological:     Mental Status: He is alert and oriented to person, place, and time.  Psychiatric:        Mood and Affect: Mood normal.        Behavior: Behavior normal.      Outpatient Encounter Medications as of 04/13/2023  Medication Sig   albuterol (VENTOLIN HFA) 108 (90 Base) MCG/ACT inhaler Inhale 2 puffs into the lungs every 6 (six) hours as needed for wheezing or shortness of breath.   aspirin EC 81 MG tablet Take 81 mg by mouth every other day. Swallow whole.   ezetimibe (ZETIA) 10 MG tablet Take 1 tablet (10 mg total) by mouth daily.   isosorbide mononitrate (IMDUR) 60 MG 24 hr tablet TAKE ONE TABLET BY MOUTH EVERY DAY   losartan (COZAAR) 50 MG tablet TAKE 1 TABLET BY MOUTH DAILY   metoprolol tartrate (LOPRESSOR) 25 MG tablet TAKE 1 TABLET BY MOUTH TWICE DAILY   nitroGLYCERIN (NITROSTAT) 0.4 MG SL tablet Place 1 tablet (0.4 mg total) under the tongue every 5 (five) minutes as needed for chest pain.   rosuvastatin (CRESTOR) 10 MG tablet TAKE 1 TABLET BY MOUTH DAILY   [DISCONTINUED] ezetimibe (ZETIA) 10 MG tablet TAKE 1 TABLET BY MOUTH DAILY   No facility-administered encounter medications on file as of 04/13/2023.     Lab Results  Component Value Date   WBC 9.1 04/13/2023   HGB 15.2 04/13/2023   HCT 45.5 04/13/2023   PLT 276.0 04/13/2023   GLUCOSE 99 04/13/2023   CHOL 137 04/13/2023   TRIG 97.0 04/13/2023   HDL  51.10 04/13/2023   LDLCALC 66 04/13/2023   ALT 24 04/13/2023   AST 34 04/13/2023   NA 135 04/13/2023   K 4.6 04/13/2023   CL 101 04/13/2023   CREATININE 1.36 04/13/2023   BUN 19 04/13/2023   CO2 26 04/13/2023   TSH 1.438 09/02/2022   PSA 1.48 04/13/2023   INR 1.0 06/22/2017   HGBA1C 5.8 04/13/2023    CARDIAC CATHETERIZATION  Result Date: 10/20/2022 Conclusions: Stable appearance of the coronary arteries compared to prior  catheterization in 2019.  There is still chronic total occlusion of the mid RCA with left-to-right collaterals as well as mild-moderate, nonobstructive left coronary artery disease. Normal left and right heart filling pressures. Normal Fick cardiac output/index. Recommendations: Continue antianginal therapy and secondary prevention of coronary artery disease. If dyspnea/exercise intolerance do not improve with continued medical therapy and increased activity, consider further evaluation by the CTO team for intervention to the RCA. Yvonne Kendall, MD [facility]      Assessment & Plan:  Routine general medical examination at a health care facility  Hyperlipidemia LDL goal <70 Assessment & Plan: Low cholesterol diet and exercise.  On crestor and zetia.  Follow lipid panel and liver function tests.   Orders: -     Hepatic function panel -     Lipid panel  Hyperglycemia Assessment & Plan: Documented elevation.  Check met b and A1c. Low carb diet and exercise.   Orders: -     Hemoglobin A1c  Healthcare maintenance Assessment & Plan: Physical today 04/13/23. Colonoscopy 06/22/22 - pathology - COLON POLYP, TRANSVERSE; COLD BIOPSY: HYPERPLASTIC POLYP, MILDLY INFLAMED. NEGATIVE FOR DYSPLASIA AND MALIGNANCY. Recommend f/u in 7 years. Check psa today.    Essential hypertension Assessment & Plan: Continue losartan.  Follow pressures.  Follow metabolic panel.   Orders: -     Basic metabolic panel  Prostate cancer screening -     PSA, Medicare  Easy bruising -     CBC with Differential/Platelet  Uncomplicated asthma, unspecified asthma severity, unspecified whether persistent Assessment & Plan: Has seen Dr Craige Cotta.  Has albuterol to take prn.  Back on maintenance inhaler. Breathing stable.     Tobacco abuse, in remission Assessment & Plan: Quit smoking on 08/11/12 Screening CT chest - Lung-RADS 2, benign appearance or behavior. Continue annual screening with low-dose chest CT without  contrast in 12 months. Aortic Atherosclerosis (ICD10-I70.0) and Emphysema (ICD10-J43.9). Coronary artery atherosclerosis. Artifact versus tiny gallstone. 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  S/p ESI C7-T1.  Saw [facility].  Was planning for surgery. Pain improved.  Wants to hold on any further intervention.    Stage 3a chronic kidney disease (HCC) Assessment & Plan: Avoid antiinflammatories.  Continue losartan.  Follow metabolic panel.    Bilateral carotid artery stenosis Assessment & Plan: carotid artery stenosis status post right carotid endarterectomy. Continue risk factor modification.  Continues on crestor and zetia. Continue aspirin.    Coronary artery disease of native artery of native heart with stable angina pectoris [facility]) Assessment & Plan: History of coronary artery disease with chronic total occlusion of the mid RCA managed medically.  Sees Dr End.  Continue aspirin, crestor and zetia.  Recent cath as outlined. Had f/u with cardiology 03/16/23 - known CAD. Recommended continuing isosorbide and metoprolol, along with aspirin, zetia and crestor.   History of colon polyps Assessment & Plan: Colonoscopy 06/2017 - Three  2 to 4 mm polyps in the descending colon. One 3 mm polyp in the sigmoid colon.  Recommended f/u 5 years.  Colonoscopy 06/2022 - one 2mm polyp transverse colon and diverticulosis.  Recommended f/u colonoscopy in 7 years.    Foot pain, bilateral Assessment & Plan: Appears to be c/w plantar fasciitis. Discussed stretches.  Discussed supports. Plans to try shoes/supports.  Discussed podiatry referral. Wants to hold at this time.  Follow.    Other orders -     Ezetimibe; Take 1 tablet (10 mg total) by mouth daily.  Dispense: 90 tablet; Refill: 1     Dale [place], MD

## 2023-04-14 ENCOUNTER — Encounter: Payer: Self-pay | Admitting: Internal Medicine

## 2023-04-14 DIAGNOSIS — M79671 Pain in right foot: Secondary | ICD-10-CM | POA: Insufficient documentation

## 2023-04-14 NOTE — Assessment & Plan Note (Signed)
Low cholesterol diet and exercise.  On crestor and zetia.  Follow lipid panel and liver function tests.

## 2023-04-14 NOTE — Assessment & Plan Note (Signed)
Has seen Dr Craige Cotta.  Has albuterol to take prn.  Back on maintenance inhaler. Breathing stable.

## 2023-04-14 NOTE — Assessment & Plan Note (Signed)
Appears to be c/w plantar fasciitis. Discussed stretches.  Discussed supports. Plans to try shoes/supports.  Discussed podiatry referral. Wants to hold at this time.  Follow.

## 2023-04-14 NOTE — Assessment & Plan Note (Signed)
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  S/p ESI C7-T1.  Saw [facility].  Was planning for surgery. Pain improved.  Wants to hold on any further intervention.

## 2023-04-14 NOTE — Assessment & Plan Note (Signed)
Quit smoking on 08/11/12 Screening CT chest - Lung-RADS 2, benign appearance or behavior. Continue annual screening with low-dose chest CT without contrast in 12 months. Aortic Atherosclerosis (ICD10-I70.0) and Emphysema (ICD10-J43.9). Coronary artery atherosclerosis. Artifact versus tiny gallstone. Plan f/u chest CT - 04/2023

## 2023-04-14 NOTE — Assessment & Plan Note (Signed)
Avoid antiinflammatories.  Continue losartan.  Follow metabolic panel.  

## 2023-04-14 NOTE — Assessment & Plan Note (Signed)
Colonoscopy 06/2017 - Three 2 to 4 mm polyps in the descending colon. One 3 mm polyp in the sigmoid colon.  Recommended f/u 5 years.  Colonoscopy 06/2022 - one 2mm polyp transverse colon and diverticulosis.  Recommended f/u colonoscopy in 7 years.

## 2023-04-14 NOTE — Assessment & Plan Note (Signed)
History of coronary artery disease with chronic total occlusion of the mid RCA managed medically.  Sees Dr End.  Continue aspirin, crestor and zetia.  Recent cath as outlined. Had f/u with cardiology 03/16/23 - known CAD. Recommended continuing isosorbide and metoprolol, along with aspirin, zetia and crestor.

## 2023-04-14 NOTE — Assessment & Plan Note (Signed)
carotid artery stenosis status post right carotid endarterectomy. Continue risk factor modification.  Continues on crestor and zetia. Continue aspirin.

## 2023-04-14 NOTE — Assessment & Plan Note (Signed)
Documented elevation.  Check met b and A1c. Low carb diet and exercise.  

## 2023-04-14 NOTE — Assessment & Plan Note (Signed)
Continue losartan.  Follow pressures.  Follow metabolic panel.  

## 2023-04-26 DIAGNOSIS — L821 Other seborrheic keratosis: Secondary | ICD-10-CM | POA: Diagnosis not present

## 2023-04-26 DIAGNOSIS — Z85828 Personal history of other malignant neoplasm of skin: Secondary | ICD-10-CM | POA: Diagnosis not present

## 2023-04-26 DIAGNOSIS — L570 Actinic keratosis: Secondary | ICD-10-CM | POA: Diagnosis not present

## 2023-04-26 DIAGNOSIS — L814 Other melanin hyperpigmentation: Secondary | ICD-10-CM | POA: Diagnosis not present

## 2023-04-26 DIAGNOSIS — D229 Melanocytic nevi, unspecified: Secondary | ICD-10-CM | POA: Diagnosis not present

## 2023-04-27 DIAGNOSIS — H43813 Vitreous degeneration, bilateral: Secondary | ICD-10-CM | POA: Diagnosis not present

## 2023-04-27 DIAGNOSIS — H2513 Age-related nuclear cataract, bilateral: Secondary | ICD-10-CM | POA: Diagnosis not present

## 2023-04-28 ENCOUNTER — Encounter: Payer: Self-pay | Admitting: Internal Medicine

## 2023-04-28 DIAGNOSIS — J4540 Moderate persistent asthma, uncomplicated: Secondary | ICD-10-CM

## 2023-04-28 NOTE — Telephone Encounter (Signed)
Ok

## 2023-04-28 NOTE — Telephone Encounter (Signed)
Ok to refer to Dr Craige Cotta?

## 2023-05-09 ENCOUNTER — Ambulatory Visit
Admission: RE | Admit: 2023-05-09 | Discharge: 2023-05-09 | Disposition: A | Discharge status: Home / Self Care | Payer: Medicare HMO | Source: Ambulatory Visit | Attending: Acute Care | Admitting: Acute Care

## 2023-05-09 DIAGNOSIS — Z0100 Encounter for examination of eyes and vision without abnormal findings: Secondary | ICD-10-CM | POA: Diagnosis not present

## 2023-05-09 DIAGNOSIS — Z87891 Personal history of nicotine dependence: Secondary | ICD-10-CM | POA: Diagnosis not present

## 2023-05-09 DIAGNOSIS — F17210 Nicotine dependence, cigarettes, uncomplicated: Secondary | ICD-10-CM | POA: Diagnosis not present

## 2023-05-09 DIAGNOSIS — Z122 Encounter for screening for malignant neoplasm of respiratory organs: Secondary | ICD-10-CM | POA: Diagnosis not present

## 2023-05-11 ENCOUNTER — Other Ambulatory Visit: Payer: Self-pay | Admitting: Internal Medicine

## 2023-05-15 DIAGNOSIS — I469 Cardiac arrest, cause unspecified: Secondary | ICD-10-CM

## 2023-05-15 HISTORY — DX: Cardiac arrest, cause unspecified: I46.9

## 2023-05-22 DIAGNOSIS — S2232XA Fracture of one rib, left side, initial encounter for closed fracture: Secondary | ICD-10-CM | POA: Diagnosis not present

## 2023-05-22 DIAGNOSIS — I119 Hypertensive heart disease without heart failure: Secondary | ICD-10-CM | POA: Diagnosis not present

## 2023-05-22 DIAGNOSIS — J45909 Unspecified asthma, uncomplicated: Secondary | ICD-10-CM | POA: Diagnosis not present

## 2023-05-22 DIAGNOSIS — S2242XA Multiple fractures of ribs, left side, initial encounter for closed fracture: Secondary | ICD-10-CM | POA: Diagnosis not present

## 2023-05-22 DIAGNOSIS — R140 Abdominal distension (gaseous): Secondary | ICD-10-CM | POA: Diagnosis not present

## 2023-05-22 DIAGNOSIS — F1011 Alcohol abuse, in remission: Secondary | ICD-10-CM | POA: Diagnosis not present

## 2023-05-22 DIAGNOSIS — R079 Chest pain, unspecified: Secondary | ICD-10-CM | POA: Diagnosis not present

## 2023-05-22 DIAGNOSIS — E1165 Type 2 diabetes mellitus with hyperglycemia: Secondary | ICD-10-CM | POA: Diagnosis not present

## 2023-05-22 DIAGNOSIS — S2220XA Unspecified fracture of sternum, initial encounter for closed fracture: Secondary | ICD-10-CM | POA: Diagnosis not present

## 2023-05-22 DIAGNOSIS — T82120A Displacement of cardiac electrode, initial encounter: Secondary | ICD-10-CM | POA: Diagnosis not present

## 2023-05-22 DIAGNOSIS — J4530 Mild persistent asthma, uncomplicated: Secondary | ICD-10-CM | POA: Diagnosis not present

## 2023-05-22 DIAGNOSIS — S2241XA Multiple fractures of ribs, right side, initial encounter for closed fracture: Secondary | ICD-10-CM | POA: Diagnosis not present

## 2023-05-22 DIAGNOSIS — I340 Nonrheumatic mitral (valve) insufficiency: Secondary | ICD-10-CM | POA: Diagnosis not present

## 2023-05-22 DIAGNOSIS — R404 Transient alteration of awareness: Secondary | ICD-10-CM | POA: Diagnosis not present

## 2023-05-22 DIAGNOSIS — Z7951 Long term (current) use of inhaled steroids: Secondary | ICD-10-CM | POA: Diagnosis not present

## 2023-05-22 DIAGNOSIS — I469 Cardiac arrest, cause unspecified: Secondary | ICD-10-CM | POA: Diagnosis not present

## 2023-05-22 DIAGNOSIS — R0789 Other chest pain: Secondary | ICD-10-CM | POA: Diagnosis not present

## 2023-05-22 DIAGNOSIS — R059 Cough, unspecified: Secondary | ICD-10-CM | POA: Diagnosis not present

## 2023-05-22 DIAGNOSIS — S2231XA Fracture of one rib, right side, initial encounter for closed fracture: Secondary | ICD-10-CM | POA: Diagnosis not present

## 2023-05-22 DIAGNOSIS — E785 Hyperlipidemia, unspecified: Secondary | ICD-10-CM | POA: Diagnosis not present

## 2023-05-22 DIAGNOSIS — Z8674 Personal history of sudden cardiac arrest: Secondary | ICD-10-CM | POA: Diagnosis not present

## 2023-05-22 DIAGNOSIS — Z950 Presence of cardiac pacemaker: Secondary | ICD-10-CM | POA: Diagnosis not present

## 2023-05-22 DIAGNOSIS — I051 Rheumatic mitral insufficiency: Secondary | ICD-10-CM | POA: Diagnosis not present

## 2023-05-22 DIAGNOSIS — Z6831 Body mass index (BMI) 31.0-31.9, adult: Secondary | ICD-10-CM | POA: Diagnosis not present

## 2023-05-22 DIAGNOSIS — X58XXXA Exposure to other specified factors, initial encounter: Secondary | ICD-10-CM | POA: Diagnosis not present

## 2023-05-22 DIAGNOSIS — Z0389 Encounter for observation for other suspected diseases and conditions ruled out: Secondary | ICD-10-CM | POA: Diagnosis not present

## 2023-05-22 DIAGNOSIS — Z79899 Other long term (current) drug therapy: Secondary | ICD-10-CM | POA: Diagnosis not present

## 2023-05-22 DIAGNOSIS — I10 Essential (primary) hypertension: Secondary | ICD-10-CM | POA: Diagnosis not present

## 2023-05-22 DIAGNOSIS — U071 COVID-19: Secondary | ICD-10-CM | POA: Diagnosis not present

## 2023-05-22 DIAGNOSIS — I255 Ischemic cardiomyopathy: Secondary | ICD-10-CM | POA: Diagnosis not present

## 2023-05-22 DIAGNOSIS — R9431 Abnormal electrocardiogram [ECG] [EKG]: Secondary | ICD-10-CM | POA: Diagnosis not present

## 2023-05-22 DIAGNOSIS — E872 Acidosis, unspecified: Secondary | ICD-10-CM | POA: Diagnosis not present

## 2023-05-22 DIAGNOSIS — I2582 Chronic total occlusion of coronary artery: Secondary | ICD-10-CM | POA: Diagnosis not present

## 2023-05-22 DIAGNOSIS — N179 Acute kidney failure, unspecified: Secondary | ICD-10-CM | POA: Diagnosis not present

## 2023-05-22 DIAGNOSIS — M96A3 Multiple fractures of ribs associated with chest compression and cardiopulmonary resuscitation: Secondary | ICD-10-CM | POA: Diagnosis not present

## 2023-05-22 DIAGNOSIS — J90 Pleural effusion, not elsewhere classified: Secondary | ICD-10-CM | POA: Diagnosis not present

## 2023-05-22 DIAGNOSIS — Y712 Prosthetic and other implants, materials and accessory cardiovascular devices associated with adverse incidents: Secondary | ICD-10-CM | POA: Diagnosis not present

## 2023-05-22 DIAGNOSIS — Z7982 Long term (current) use of aspirin: Secondary | ICD-10-CM | POA: Diagnosis not present

## 2023-05-22 DIAGNOSIS — G8929 Other chronic pain: Secondary | ICD-10-CM | POA: Diagnosis not present

## 2023-05-22 DIAGNOSIS — Z8709 Personal history of other diseases of the respiratory system: Secondary | ICD-10-CM | POA: Diagnosis not present

## 2023-05-22 DIAGNOSIS — T82110A Breakdown (mechanical) of cardiac electrode, initial encounter: Secondary | ICD-10-CM | POA: Diagnosis not present

## 2023-05-22 DIAGNOSIS — R0989 Other specified symptoms and signs involving the circulatory and respiratory systems: Secondary | ICD-10-CM | POA: Diagnosis not present

## 2023-05-22 DIAGNOSIS — J9811 Atelectasis: Secondary | ICD-10-CM | POA: Diagnosis not present

## 2023-05-22 DIAGNOSIS — J690 Pneumonitis due to inhalation of food and vomit: Secondary | ICD-10-CM | POA: Diagnosis not present

## 2023-05-22 DIAGNOSIS — S2243XA Multiple fractures of ribs, bilateral, initial encounter for closed fracture: Secondary | ICD-10-CM | POA: Diagnosis not present

## 2023-05-22 DIAGNOSIS — R918 Other nonspecific abnormal finding of lung field: Secondary | ICD-10-CM | POA: Diagnosis not present

## 2023-05-22 DIAGNOSIS — Z87891 Personal history of nicotine dependence: Secondary | ICD-10-CM | POA: Diagnosis not present

## 2023-05-22 DIAGNOSIS — E669 Obesity, unspecified: Secondary | ICD-10-CM | POA: Diagnosis not present

## 2023-05-22 DIAGNOSIS — S42019A Nondisplaced fracture of sternal end of unspecified clavicle, initial encounter for closed fracture: Secondary | ICD-10-CM | POA: Diagnosis not present

## 2023-05-22 DIAGNOSIS — M96A1 Fracture of sternum associated with chest compression and cardiopulmonary resuscitation: Secondary | ICD-10-CM | POA: Diagnosis not present

## 2023-05-22 DIAGNOSIS — I21A1 Myocardial infarction type 2: Secondary | ICD-10-CM | POA: Diagnosis not present

## 2023-05-22 DIAGNOSIS — I213 ST elevation (STEMI) myocardial infarction of unspecified site: Secondary | ICD-10-CM | POA: Diagnosis not present

## 2023-05-22 DIAGNOSIS — J939 Pneumothorax, unspecified: Secondary | ICD-10-CM | POA: Diagnosis not present

## 2023-05-22 DIAGNOSIS — R509 Fever, unspecified: Secondary | ICD-10-CM | POA: Diagnosis not present

## 2023-05-22 DIAGNOSIS — I2510 Atherosclerotic heart disease of native coronary artery without angina pectoris: Secondary | ICD-10-CM | POA: Diagnosis not present

## 2023-05-22 DIAGNOSIS — R062 Wheezing: Secondary | ICD-10-CM | POA: Diagnosis not present

## 2023-05-22 DIAGNOSIS — E7800 Pure hypercholesterolemia, unspecified: Secondary | ICD-10-CM | POA: Diagnosis not present

## 2023-05-22 DIAGNOSIS — R4182 Altered mental status, unspecified: Secondary | ICD-10-CM | POA: Diagnosis not present

## 2023-05-24 ENCOUNTER — Ambulatory Visit (HOSPITAL_BASED_OUTPATIENT_CLINIC_OR_DEPARTMENT_OTHER): Payer: Medicare HMO | Admitting: Pulmonary Disease

## 2023-05-30 ENCOUNTER — Other Ambulatory Visit: Payer: Self-pay

## 2023-05-30 DIAGNOSIS — Z122 Encounter for screening for malignant neoplasm of respiratory organs: Secondary | ICD-10-CM

## 2023-05-30 DIAGNOSIS — F17210 Nicotine dependence, cigarettes, uncomplicated: Secondary | ICD-10-CM

## 2023-05-30 DIAGNOSIS — Z87891 Personal history of nicotine dependence: Secondary | ICD-10-CM

## 2023-05-30 HISTORY — PX: CARDIAC DEFIBRILLATOR PLACEMENT: SHX171

## 2023-06-04 DIAGNOSIS — I2510 Atherosclerotic heart disease of native coronary artery without angina pectoris: Secondary | ICD-10-CM | POA: Diagnosis not present

## 2023-06-04 DIAGNOSIS — Z95810 Presence of automatic (implantable) cardiac defibrillator: Secondary | ICD-10-CM | POA: Diagnosis not present

## 2023-06-04 DIAGNOSIS — D72829 Elevated white blood cell count, unspecified: Secondary | ICD-10-CM | POA: Diagnosis not present

## 2023-06-04 DIAGNOSIS — I10 Essential (primary) hypertension: Secondary | ICD-10-CM | POA: Diagnosis not present

## 2023-06-06 ENCOUNTER — Inpatient Hospital Stay: Payer: Medicare HMO | Admitting: Family Medicine

## 2023-06-06 DIAGNOSIS — I2510 Atherosclerotic heart disease of native coronary artery without angina pectoris: Secondary | ICD-10-CM | POA: Diagnosis not present

## 2023-06-10 DIAGNOSIS — J45990 Exercise induced bronchospasm: Secondary | ICD-10-CM | POA: Diagnosis not present

## 2023-06-10 DIAGNOSIS — J4540 Moderate persistent asthma, uncomplicated: Secondary | ICD-10-CM | POA: Diagnosis not present

## 2023-06-10 DIAGNOSIS — Z87891 Personal history of nicotine dependence: Secondary | ICD-10-CM | POA: Diagnosis not present

## 2023-06-10 DIAGNOSIS — J432 Centrilobular emphysema: Secondary | ICD-10-CM | POA: Diagnosis not present

## 2023-06-17 ENCOUNTER — Other Ambulatory Visit: Payer: Self-pay | Admitting: Internal Medicine

## 2023-06-17 ENCOUNTER — Ambulatory Visit (INDEPENDENT_AMBULATORY_CARE_PROVIDER_SITE_OTHER): Payer: Medicare Other

## 2023-06-17 ENCOUNTER — Ambulatory Visit (INDEPENDENT_AMBULATORY_CARE_PROVIDER_SITE_OTHER): Payer: Medicare Other | Admitting: Internal Medicine

## 2023-06-17 ENCOUNTER — Encounter: Payer: Self-pay | Admitting: Internal Medicine

## 2023-06-17 VITALS — BP 128/72 | HR 85 | Temp 97.9°F | Resp 16 | Ht 69.0 in | Wt 208.6 lb

## 2023-06-17 DIAGNOSIS — I5031 Acute diastolic (congestive) heart failure: Secondary | ICD-10-CM | POA: Diagnosis not present

## 2023-06-17 DIAGNOSIS — J189 Pneumonia, unspecified organism: Secondary | ICD-10-CM | POA: Diagnosis not present

## 2023-06-17 DIAGNOSIS — E785 Hyperlipidemia, unspecified: Secondary | ICD-10-CM

## 2023-06-17 DIAGNOSIS — J45909 Unspecified asthma, uncomplicated: Secondary | ICD-10-CM | POA: Diagnosis not present

## 2023-06-17 DIAGNOSIS — R0609 Other forms of dyspnea: Secondary | ICD-10-CM

## 2023-06-17 DIAGNOSIS — R7989 Other specified abnormal findings of blood chemistry: Secondary | ICD-10-CM

## 2023-06-17 DIAGNOSIS — J90 Pleural effusion, not elsewhere classified: Secondary | ICD-10-CM | POA: Diagnosis not present

## 2023-06-17 DIAGNOSIS — M96A3 Multiple fractures of ribs associated with chest compression and cardiopulmonary resuscitation: Secondary | ICD-10-CM | POA: Diagnosis not present

## 2023-06-17 DIAGNOSIS — I25118 Atherosclerotic heart disease of native coronary artery with other forms of angina pectoris: Secondary | ICD-10-CM

## 2023-06-17 DIAGNOSIS — I051 Rheumatic mitral insufficiency: Secondary | ICD-10-CM | POA: Diagnosis not present

## 2023-06-17 DIAGNOSIS — Z87891 Personal history of nicotine dependence: Secondary | ICD-10-CM | POA: Diagnosis not present

## 2023-06-17 DIAGNOSIS — I071 Rheumatic tricuspid insufficiency: Secondary | ICD-10-CM | POA: Diagnosis not present

## 2023-06-17 DIAGNOSIS — J918 Pleural effusion in other conditions classified elsewhere: Secondary | ICD-10-CM | POA: Diagnosis not present

## 2023-06-17 DIAGNOSIS — N179 Acute kidney failure, unspecified: Secondary | ICD-10-CM | POA: Diagnosis not present

## 2023-06-17 DIAGNOSIS — Z8673 Personal history of transient ischemic attack (TIA), and cerebral infarction without residual deficits: Secondary | ICD-10-CM | POA: Diagnosis not present

## 2023-06-17 DIAGNOSIS — J4520 Mild intermittent asthma, uncomplicated: Secondary | ICD-10-CM | POA: Diagnosis not present

## 2023-06-17 DIAGNOSIS — R509 Fever, unspecified: Secondary | ICD-10-CM | POA: Diagnosis not present

## 2023-06-17 DIAGNOSIS — I10 Essential (primary) hypertension: Secondary | ICD-10-CM

## 2023-06-17 DIAGNOSIS — Z85820 Personal history of malignant melanoma of skin: Secondary | ICD-10-CM | POA: Diagnosis not present

## 2023-06-17 DIAGNOSIS — I2510 Atherosclerotic heart disease of native coronary artery without angina pectoris: Secondary | ICD-10-CM | POA: Diagnosis not present

## 2023-06-17 DIAGNOSIS — I6523 Occlusion and stenosis of bilateral carotid arteries: Secondary | ICD-10-CM | POA: Diagnosis not present

## 2023-06-17 DIAGNOSIS — Z7982 Long term (current) use of aspirin: Secondary | ICD-10-CM | POA: Diagnosis not present

## 2023-06-17 DIAGNOSIS — Z8674 Personal history of sudden cardiac arrest: Secondary | ICD-10-CM | POA: Diagnosis not present

## 2023-06-17 DIAGNOSIS — Z95810 Presence of automatic (implantable) cardiac defibrillator: Secondary | ICD-10-CM | POA: Diagnosis not present

## 2023-06-17 DIAGNOSIS — I110 Hypertensive heart disease with heart failure: Secondary | ICD-10-CM | POA: Diagnosis not present

## 2023-06-17 DIAGNOSIS — R918 Other nonspecific abnormal finding of lung field: Secondary | ICD-10-CM | POA: Diagnosis not present

## 2023-06-17 DIAGNOSIS — N411 Chronic prostatitis: Secondary | ICD-10-CM | POA: Diagnosis not present

## 2023-06-17 DIAGNOSIS — R600 Localized edema: Secondary | ICD-10-CM | POA: Diagnosis not present

## 2023-06-17 DIAGNOSIS — J9811 Atelectasis: Secondary | ICD-10-CM | POA: Diagnosis not present

## 2023-06-17 DIAGNOSIS — Z7951 Long term (current) use of inhaled steroids: Secondary | ICD-10-CM | POA: Diagnosis not present

## 2023-06-17 DIAGNOSIS — K219 Gastro-esophageal reflux disease without esophagitis: Secondary | ICD-10-CM | POA: Diagnosis not present

## 2023-06-17 DIAGNOSIS — D649 Anemia, unspecified: Secondary | ICD-10-CM | POA: Diagnosis not present

## 2023-06-17 DIAGNOSIS — I371 Nonrheumatic pulmonary valve insufficiency: Secondary | ICD-10-CM | POA: Diagnosis not present

## 2023-06-17 DIAGNOSIS — N1831 Chronic kidney disease, stage 3a: Secondary | ICD-10-CM

## 2023-06-17 DIAGNOSIS — R739 Hyperglycemia, unspecified: Secondary | ICD-10-CM

## 2023-06-17 DIAGNOSIS — Z955 Presence of coronary angioplasty implant and graft: Secondary | ICD-10-CM | POA: Diagnosis not present

## 2023-06-17 DIAGNOSIS — N289 Disorder of kidney and ureter, unspecified: Secondary | ICD-10-CM | POA: Diagnosis not present

## 2023-06-17 LAB — CBC WITH DIFFERENTIAL/PLATELET
Basophils Absolute: 0.1 10*3/uL (ref 0.0–0.1)
Basophils Relative: 1.2 % (ref 0.0–3.0)
Eosinophils Absolute: 0.3 10*3/uL (ref 0.0–0.7)
Eosinophils Relative: 2.8 % (ref 0.0–5.0)
HCT: 38.7 % — ABNORMAL LOW (ref 39.0–52.0)
Hemoglobin: 12.9 g/dL — ABNORMAL LOW (ref 13.0–17.0)
Lymphocytes Relative: 14.3 % (ref 12.0–46.0)
Lymphs Abs: 1.5 10*3/uL (ref 0.7–4.0)
MCHC: 33.4 g/dL (ref 30.0–36.0)
MCV: 92.2 fL (ref 78.0–100.0)
Monocytes Absolute: 1.4 10*3/uL — ABNORMAL HIGH (ref 0.1–1.0)
Monocytes Relative: 13.3 % — ABNORMAL HIGH (ref 3.0–12.0)
Neutro Abs: 7.3 10*3/uL (ref 1.4–7.7)
Neutrophils Relative %: 68.4 % (ref 43.0–77.0)
Platelets: 381 10*3/uL (ref 150.0–400.0)
RBC: 4.2 Mil/uL — ABNORMAL LOW (ref 4.22–5.81)
RDW: 13.4 % (ref 11.5–15.5)
WBC: 10.7 10*3/uL — ABNORMAL HIGH (ref 4.0–10.5)

## 2023-06-17 LAB — BASIC METABOLIC PANEL
BUN: 22 mg/dL (ref 6–23)
CO2: 27 meq/L (ref 19–32)
Calcium: 8.7 mg/dL (ref 8.4–10.5)
Chloride: 98 meq/L (ref 96–112)
Creatinine, Ser: 1.37 mg/dL (ref 0.40–1.50)
GFR: 52.77 mL/min — ABNORMAL LOW (ref >60.00)
Glucose, Bld: 88 mg/dL (ref 70–99)
Potassium: 4.9 meq/L (ref 3.5–5.1)
Sodium: 132 meq/L — ABNORMAL LOW (ref 135–145)

## 2023-06-17 LAB — HEPATIC FUNCTION PANEL
ALT: 12 U/L (ref 0–53)
AST: 18 U/L (ref 0–37)
Albumin: 3.6 g/dL (ref 3.5–5.2)
Alkaline Phosphatase: 99 U/L (ref 39–117)
Bilirubin, Direct: 0.2 mg/dL (ref 0.0–0.3)
Total Bilirubin: 0.6 mg/dL (ref 0.2–1.2)
Total Protein: 6.6 g/dL (ref 6.0–8.3)

## 2023-06-17 NOTE — Progress Notes (Signed)
 Subjective:    Patient ID: Christopher Villegas, male    DOB: July 08, 1955, 68 y.o.   MRN: [id number]  Patient here for  Chief Complaint  Patient presents with   Hospitalization Follow-up    HPI Here for hospital follow up.  He is accompanied by his wife. History obtained from both of them. Hospitalized after a witnessed cardiac arrest - collapsed 05/22/23 - while waiting in line. Was pulseless.  Immediate bystander CPR followed by ACLS measures by fire department. (Six minutes of chest compression and one shock). ECHO - EF 55%. S/p cardiac MRI 05/27/23 - no evidence of myocardial scar, fibrosis or prior infarct: EF 66.7%. Troponins peaked @ 543. - s/p LHC 05/22/23. Known CTO of the RCA which fills via left-to-right collaterals. Otherwise mild disease noted in the LCx. CT chest/ abdomen/ Pelvis 12/8: showed a non-displaced fracture of the mid sternum with a small amount of retrosternal edema and minimally displaced fracture of the bilateral anterior 2nd - 4th ribs. Tested positive for covid 05/26/23. Was also evaluated by pulmonary - recommended to continue arnuity. 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 Returned to hospital 12/21 - c/o redness, firmness and warmth around incision. Treated with IV cefepime and vancomycin.  Discharged home 06/06/23 - on doxycycline and keflex. Had f/u with cardiology 06/14/23 - f/u incision, defibrillator. Had f/u with pulmonary 06/10/23 - no changes made. Reports that he has noticed some persistent increased sob.  Notices more with increased exertion. Also reports pain - right lateral side. Cough is some better. Is getting up 3-5x/night and urinating. Discussed possible sleep apnea. Wife is concerned as well about possible sleep apnea. No fever reported. No acute change in symptoms. Eating. No abdominal pain or bowel issues reported.    Past Medical History:  Diagnosis Date   Asthma    CAD (coronary artery disease)     a. 06/2017 Cath: LM 30ost/d, LAD 70m, D1/2 large, nl, D3 small, LCX 20ost, 40p, OM1/2/3 nl, RCA 40/164m - CTO w/ L->R collats. RPDA fills via collats from 1st Septal. EF 55-65%-->Med rx; b. 10/2022 Cath: 10/2022 Cath: LM 30ost/d, LAD 71m, LCx 25ost, 20p, RCA 62m, 157m CTO, RPDA fills via 1st septal. Nl R heart pressures-->Med Rx.   Cancer [facility])    Basal Cell carcinoma   Carotid arterial disease (HCC)    a. 2009 s/p R CEA; b. 05/2018 U/S: RICA 1-39%, RECA >50%, LICA 1-39%, LECA <50%.   GERD (gastroesophageal reflux disease)    History of colon polyps    History of echocardiogram    a. 10/2015 Echo: EF 55%. Mild TR/MR; b. 10/2022 Echo: EF 55-60%, no rwma, nl diast fxn, nl RV fxn, mildly dil LA, no significant valvular disease.   Hyperlipidemia    Hypertension    Past Surgical History:  Procedure Laterality Date   BICEPS TENDON REPAIR  2021   CAROTID ENDARTERECTOMY Right 2009   COLONOSCOPY WITH PROPOFOL  N/A 06/16/2017   Procedure: COLONOSCOPY WITH PROPOFOL ;  Surgeon: Jinny Carmine, MD;  Location: [facility];  Service: Endoscopy;  Laterality: N/A;   COLONOSCOPY WITH PROPOFOL  N/A 06/22/2022   Procedure: COLONOSCOPY WITH PROPOFOL ;  Surgeon: Jinny Carmine, MD;  Location: [facility] ENDOSCOPY;  Service: Endoscopy;  Laterality: N/A;   CYST EXCISION PERINEAL     ESOPHAGOGASTRODUODENOSCOPY (EGD) WITH PROPOFOL  N/A 06/16/2017   Procedure: ESOPHAGOGASTRODUODENOSCOPY (EGD) WITH PROPOFOL ;  Surgeon: Jinny Carmine, MD;  Location: [facility];  Service: Endoscopy;  Laterality: N/A;   LEFT  HEART CATH AND CORONARY ANGIOGRAPHY N/A 07/01/2017   Procedure: LEFT HEART CATH AND CORONARY ANGIOGRAPHY;  Surgeon: Mady Bruckner, MD;  Location: [facility];  Service: Cardiovascular;  Laterality: N/A;   POLYPECTOMY  06/16/2017   Procedure: POLYPECTOMY;  Surgeon: Jinny Carmine, MD;  Location: [facility];  Service: Endoscopy;;   RIGHT/LEFT HEART CATH AND CORONARY ANGIOGRAPHY Bilateral 10/20/2022    Procedure: RIGHT/LEFT HEART CATH AND CORONARY ANGIOGRAPHY;  Surgeon: Mady Bruckner, MD;  Location: [facility] INVASIVE CV LAB;  Service: Cardiovascular;  Laterality: Bilateral;   TONSILLECTOMY AND ADENOIDECTOMY     Family History  Problem Relation Age of Onset   Cancer Mother        breast   Hypertension Mother    Hyperlipidemia Mother    Arthritis Mother        osteo   Hypertension Father    Heart disease Father        CHF   Kidney failure Father        causing death   COPD Father    Alcohol abuse Father    Autoimmune disease Sister    Lupus Sister    Healthy Son    Healthy Son    Social History   Socioeconomic History   Marital status: Married    Spouse name: Beth   Number of children: 2   Years of education: 16   Highest education level: Bachelor's degree (e.g., BA, AB, BS)  Occupational History   Occupation: [employer]  Tobacco Use   Smoking status: Former    Current packs/day: 0.00    Average packs/day: 1.5 packs/day for 45.0 years (67.5 ttl pk-yrs)    Types: Cigarettes    Start date: 09/28/1969    Quit date: 09/29/2014    Years since quitting: 8.7    Passive exposure: Past   Smokeless tobacco: Never  Vaping Use   Vaping status: Never Used  Substance and Sexual Activity   Alcohol use: No    Comment: Former Alcoholic - sober since 03/20/1989   Drug use: No   Sexual activity: Yes  Other Topics Concern   Not on file  Social History Narrative   Not on file   Social Drivers of Health   Financial Resource Strain: Low Risk  (06/13/2023)   Overall Financial Resource Strain (CARDIA)    Difficulty of Paying Living Expenses: Not hard at all  Food Insecurity: No Food Insecurity (06/13/2023)   Hunger Vital Sign    Worried About Running Out of Food in the Last Year: Never true    Ran Out of Food in the Last Year: Never true  Transportation Needs: No Transportation Needs (06/13/2023)   PRAPARE - Administrator, Civil Service (Medical):  No    Lack of Transportation (Non-Medical): No  Physical Activity: Inactive (06/13/2023)   Exercise Vital Sign    Days of Exercise per Week: 0 days    Minutes of Exercise per Session: 30 min  Stress: No Stress Concern Present (06/13/2023)   [employer] of Occupational Health - Occupational Stress Questionnaire    Feeling of Stress : Not at all  Social Connections: Moderately Integrated (06/13/2023)   Social Connection and Isolation Panel [NHANES]    Frequency of Communication with Friends and Family: More than three times a week    Frequency of Social Gatherings with Friends and Family: Twice a week    Attends Religious Services: More than 4 times per year    Active  Member of Clubs or Organizations: No    Attends Engineer, Structural: Not on file    Marital Status: Married   Medications reconciled with patient.   Review of Systems  Constitutional:  Negative for appetite change, fever and unexpected weight change.  HENT:  Negative for congestion and sinus pressure.   Respiratory:  Positive for shortness of breath. Negative for chest tightness.        Cough is some better. Still with cough, but better.   Cardiovascular:  Negative for chest pain and palpitations.       No increased swelling.   Gastrointestinal:  Negative for abdominal pain, diarrhea, nausea and vomiting.  Genitourinary:  Negative for difficulty urinating and dysuria.  Musculoskeletal:  Negative for joint swelling and myalgias.       Right lateral side pain as outlined.   Skin:  Negative for color change and rash.  Neurological:  Negative for dizziness and headaches.  Psychiatric/Behavioral:  Negative for agitation and dysphoric mood.        Objective:     BP 128/72   Pulse 85   Temp 97.9 F (36.6 C)   Resp 16   Ht 5' 9 (1.753 m)   Wt 208 lb 9.6 oz (94.6 kg)   SpO2 98%   BMI 30.80 kg/m  Wt Readings from Last 3 Encounters:  06/17/23 208 lb 9.6 oz (94.6 kg)  05/09/23 205 lb (93 kg)   04/13/23 213 lb 6.4 oz (96.8 kg)    Physical Exam Vitals reviewed.  Constitutional:      General: He is not in acute distress.    Appearance: Normal appearance. He is well-developed.  HENT:     Head: Normocephalic and atraumatic.     Right Ear: External ear normal.     Left Ear: External ear normal.     Mouth/Throat:     Pharynx: No oropharyngeal exudate or posterior oropharyngeal erythema.  Eyes:     General: No scleral icterus.       Right eye: No discharge.        Left eye: No discharge.     Conjunctiva/sclera: Conjunctivae normal.  Neck:     Vascular: No carotid bruit.  Cardiovascular:     Rate and Rhythm: Normal rate and regular rhythm.  Pulmonary:     Effort: Pulmonary effort is normal.     Comments: Diminished breath sounds right lower lobe.  Abdominal:     General: Bowel sounds are normal.     Palpations: Abdomen is soft.     Tenderness: There is no abdominal tenderness.  Musculoskeletal:        General: No swelling or tenderness.     Cervical back: Neck supple. No tenderness.  Lymphadenopathy:     Cervical: No cervical adenopathy.  Skin:    Findings: No erythema or rash.  Neurological:     Mental Status: He is alert.  Psychiatric:        Mood and Affect: Mood normal.        Behavior: Behavior normal.      Outpatient Encounter Medications as of 06/17/2023  Medication Sig   albuterol  (VENTOLIN  HFA) 108 (90 Base) MCG/ACT inhaler Inhale 2 puffs into the lungs every 6 (six) hours as needed for wheezing or shortness of breath.   aspirin  EC 81 MG tablet Take 81 mg by mouth every other day. Swallow whole.   ezetimibe  (ZETIA ) 10 MG tablet Take 1 tablet (10 mg total) by mouth daily.  isosorbide  mononitrate (IMDUR ) 60 MG 24 hr tablet TAKE ONE TABLET BY MOUTH EVERY DAY   losartan  (COZAAR ) 50 MG tablet TAKE 1 TABLET BY MOUTH DAILY   metoprolol  tartrate (LOPRESSOR ) 25 MG tablet TAKE 1 TABLET BY MOUTH TWICE DAILY   nitroGLYCERIN  (NITROSTAT ) 0.4 MG SL tablet Place 1  tablet (0.4 mg total) under the tongue every 5 (five) minutes as needed for chest pain.   rosuvastatin  (CRESTOR ) 10 MG tablet TAKE 1 TABLET BY MOUTH DAILY   [DISCONTINUED] isosorbide  mononitrate (IMDUR ) 60 MG 24 hr tablet TAKE ONE TABLET BY MOUTH EVERY DAY   No facility-administered encounter medications on file as of 06/17/2023.     Lab Results  Component Value Date   WBC 10.7 (H) 06/17/2023   HGB 12.9 (L) 06/17/2023   HCT 38.7 (L) 06/17/2023   PLT 381.0 06/17/2023   GLUCOSE 88 06/17/2023   CHOL 137 04/13/2023   TRIG 97.0 04/13/2023   HDL 51.10 04/13/2023   LDLCALC 66 04/13/2023   ALT 12 06/17/2023   AST 18 06/17/2023   NA 132 (L) 06/17/2023   K 4.9 06/17/2023   CL 98 06/17/2023   CREATININE 1.37 06/17/2023   BUN 22 06/17/2023   CO2 27 06/17/2023   TSH 1.438 09/02/2022   PSA 1.48 04/13/2023   INR 1.0 06/22/2017   HGBA1C 5.8 04/13/2023    CT CHEST LUNG CA SCREEN LOW DOSE W/O CM Result Date: 05/30/2023 CLINICAL DATA:  67 year old male with 38 pack-year history of smoking. Lung cancer screening. EXAM: CT CHEST WITHOUT CONTRAST LOW-DOSE FOR LUNG CANCER SCREENING TECHNIQUE: Multidetector CT imaging of the chest was performed following the standard protocol without IV contrast. RADIATION DOSE REDUCTION: This exam was performed according to the departmental dose-optimization program which includes automated exposure control, adjustment of the mA and/or kV according to patient size and/or use of iterative reconstruction technique. COMPARISON:  05/05/2022 FINDINGS: Cardiovascular: The heart size is normal. No substantial pericardial effusion. Coronary artery calcification is evident. Mild atherosclerotic calcification is noted in the wall of the thoracic aorta. Mediastinum/Nodes: No mediastinal lymphadenopathy. No evidence for gross hilar lymphadenopathy although assessment is limited by the lack of intravenous contrast on the current study. The esophagus has normal imaging features. There is  no axillary lymphadenopathy. Lungs/Pleura: Centrilobular and paraseptal emphysema evident. Pulmonary nodules identified previously are stable in the interval no new suspicious pulmonary nodule or mass. No focal airspace consolidation. No pleural effusion. Upper Abdomen: Tiny left adrenal nodule compatible with adenoma, stable. No followup imaging is recommended. Tiny calcified gallstone evident. Musculoskeletal: No worrisome lytic or sclerotic osseous abnormality. IMPRESSION: 1. Lung-RADS 2, benign appearance or behavior. Continue annual screening with low-dose chest CT without contrast in 12 months. 2. Cholelithiasis. 3. Aortic Atherosclerosis (ICD10-I70.0) and Emphysema (ICD10-J43.9). Electronically Signed   By: Camellia Candle M.D.   On: 05/30/2023 13:22       Assessment & Plan:  Essential hypertension Assessment & Plan: Continue losartan .  Follow pressures.  Follow metabolic panel.   Orders: -     Basic metabolic panel  Hyperlipidemia LDL goal <70 Assessment & Plan: Low cholesterol diet and exercise.  On crestor  and zetia .  Follow lipid panel and liver function tests.   Orders: -     CBC with Differential/Platelet  Abnormal liver function tests Assessment & Plan: Noted slight elevation recent hospitalization. Recheck liver panel today.   Orders: -     Hepatic function panel  Dyspnea on exertion Assessment & Plan: Increased sob/DOE as outlined. Recent hospitalization with  w/up as outlined. Had recent f/u with cardiology and pulmonary. With decreased breath sounds right lower lobe, check cxr - to evaluate - question of pleural effusion, etc?  Continue isosorbide , losartan  and crestor /zetia .   Orders: -     DG Chest 2 View; Future  History of cardiac arrest Assessment & Plan: Hospitalized after a witnessed cardiac arrest - collapsed 05/22/23 - while waiting in line. Was pulseless.  Immediate bystander CPR followed by ACLS measures by fire department. (Six minutes of chest compression  and one shock). ECHO - EF 55%. S/p cardiac MRI 05/27/23 - no evidence of myocardial scar, fibrosis or prior infarct: EF 66.7%. Troponins peaked @ 543. - s/p LHC 05/22/23. Known CTO of the RCA which fills via left-to-right collaterals. Otherwise mild disease noted in the LCx. CT chest/ abdomen/ Pelvis 12/8: showed a non-displaced fracture of the mid sternum with a small amount of retrosternal edema and minimally displaced fracture of the bilateral anterior 2nd - 4th ribs.  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 Returned to hospital 12/21 - c/o redness, firmness and warmth around incision. Treated with IV cefepime and vancomycin.  Discharged home 06/06/23 - on doxycycline and keflex. Had f/u with cardiology 06/14/23 - f/u incision, defibrillator. Had f/u with pulmonary 06/10/23 - no changes made. Overall feels better, but does report the increased sob/DOE as outlined.  Check labs as outlined.  Also check cxr. With diminished breath sounds right lower lobe, confirm question of pleural effusion, etc.     Bilateral carotid artery stenosis Assessment & Plan: carotid artery stenosis status post right carotid endarterectomy. Continue risk factor modification.  Continues on crestor  and zetia . Continue aspirin .    Stage 3a chronic kidney disease (HCC) Assessment & Plan: Avoid antiinflammatories.  Continue losartan .  Follow metabolic panel.    Coronary artery disease of native artery of native heart with stable angina pectoris [facility]) Assessment & Plan: History of coronary artery disease with chronic total occlusion of the mid RCA managed medically.  Sees Dr End.  Continue aspirin , crestor  and zetia .  Recent cath as outlined. Had f/u with cardiology 03/16/23 - known CAD. Recommended continuing isosorbide  and metoprolol , along with aspirin , zetia  and crestor . Hospitalized after a witnessed cardiac arrest - collapsed 05/22/23 - while waiting in line. Was pulseless.   Immediate bystander CPR followed by ACLS measures by fire department. (Six minutes of chest compression and one shock). ECHO - EF 55%. S/p cardiac MRI 05/27/23 - no evidence of myocardial scar, fibrosis or prior infarct: EF 66.7%. Troponins peaked @ 543. - s/p LHC 05/22/23. Known CTO of the RCA which fills via left-to-right collaterals. Otherwise mild disease noted in the LCx. 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 Returned to hospital 12/21 - c/o redness, firmness and warmth around incision. Treated with IV cefepime and vancomycin.  Discharged home 06/06/23 - on doxycycline and keflex. Had f/u with cardiology 06/14/23 - f/u incision, defibrillator. Had f/u with pulmonary 06/10/23 - no changes made.   Hyperglycemia Assessment & Plan: Documented elevation.  Check met b and A1c. Low carb diet and exercise.    Uncomplicated asthma, unspecified asthma severity, unspecified whether persistent Assessment & Plan: Has seen Dr Shellia.  Has albuterol  to take prn.  Continue arnuity. Breathing stable.  Allena Hamilton, MD

## 2023-06-19 ENCOUNTER — Encounter: Payer: Self-pay | Admitting: Internal Medicine

## 2023-06-19 DIAGNOSIS — Z8674 Personal history of sudden cardiac arrest: Secondary | ICD-10-CM | POA: Insufficient documentation

## 2023-06-19 NOTE — Assessment & Plan Note (Signed)
Avoid antiinflammatories.  Continue losartan.  Follow metabolic panel.  

## 2023-06-19 NOTE — Assessment & Plan Note (Signed)
Continue losartan.  Follow pressures.  Follow metabolic panel.  

## 2023-06-19 NOTE — Assessment & Plan Note (Signed)
carotid artery stenosis status post right carotid endarterectomy. Continue risk factor modification.  Continues on crestor and zetia. Continue aspirin.

## 2023-06-19 NOTE — Assessment & Plan Note (Signed)
 Has seen Dr Craige Cotta.  Has albuterol to take prn.  Continue arnuity. Breathing stable.

## 2023-06-19 NOTE — Assessment & Plan Note (Signed)
Documented elevation.  Check met b and A1c. Low carb diet and exercise.  

## 2023-06-19 NOTE — Assessment & Plan Note (Signed)
 Increased sob/DOE as outlined. Recent hospitalization with w/up as outlined. Had recent f/u with cardiology and pulmonary. With decreased breath sounds right lower lobe, check cxr - to evaluate - question of pleural effusion, etc?  Continue isosorbide , losartan  and crestor /zetia .

## 2023-06-19 NOTE — Assessment & Plan Note (Signed)
 History of coronary artery disease with chronic total occlusion of the mid RCA managed medically.  Sees Dr End.  Continue aspirin , crestor  and zetia .  Recent cath as outlined. Had f/u with cardiology 03/16/23 - known CAD. Recommended continuing isosorbide  and metoprolol , along with aspirin , zetia  and crestor . Hospitalized after a witnessed cardiac arrest - collapsed 05/22/23 - while waiting in line. Was pulseless.  Immediate bystander CPR followed by ACLS measures by fire department. (Six minutes of chest compression and one shock). ECHO - EF 55%. S/p cardiac MRI 05/27/23 - no evidence of myocardial scar, fibrosis or prior infarct: EF 66.7%. Troponins peaked @ 543. - s/p LHC 05/22/23. Known CTO of the RCA which fills via left-to-right collaterals. Otherwise mild disease noted in the LCx. 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 Returned to hospital 12/21 - c/o redness, firmness and warmth around incision. Treated with IV cefepime and vancomycin.  Discharged home 06/06/23 - on doxycycline and keflex. Had f/u with cardiology 06/14/23 - f/u incision, defibrillator. Had f/u with pulmonary 06/10/23 - no changes made.

## 2023-06-19 NOTE — Assessment & Plan Note (Signed)
 Noted slight elevation recent hospitalization. Recheck liver panel today.

## 2023-06-19 NOTE — Assessment & Plan Note (Signed)
 Hospitalized after a witnessed cardiac arrest - collapsed 05/22/23 - while waiting in line. Was pulseless.  Immediate bystander CPR followed by ACLS measures by fire department. (Six minutes of chest compression and one shock). ECHO - EF 55%. S/p cardiac MRI 05/27/23 - no evidence of myocardial scar, fibrosis or prior infarct: EF 66.7%. Troponins peaked @ 543. - s/p LHC 05/22/23. Known CTO of the RCA which fills via left-to-right collaterals. Otherwise mild disease noted in the LCx. CT chest/ abdomen/ Pelvis 12/8: showed a non-displaced fracture of the mid sternum with a small amount of retrosternal edema and minimally displaced fracture of the bilateral anterior 2nd - 4th ribs.  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 Returned to hospital 12/21 - c/o redness, firmness and warmth around incision. Treated with IV cefepime and vancomycin.  Discharged home 06/06/23 - on doxycycline and keflex. Had f/u with cardiology 06/14/23 - f/u incision, defibrillator. Had f/u with pulmonary 06/10/23 - no changes made. Overall feels better, but does report the increased sob/DOE as outlined.  Check labs as outlined.  Also check cxr. With diminished breath sounds right lower lobe, confirm question of pleural effusion, etc.

## 2023-06-19 NOTE — Assessment & Plan Note (Signed)
Low cholesterol diet and exercise.  On crestor and zetia.  Follow lipid panel and liver function tests.

## 2023-06-20 NOTE — Telephone Encounter (Signed)
 Pt currently admitted.

## 2023-06-28 DIAGNOSIS — J90 Pleural effusion, not elsewhere classified: Secondary | ICD-10-CM | POA: Diagnosis not present

## 2023-06-29 ENCOUNTER — Ambulatory Visit (INDEPENDENT_AMBULATORY_CARE_PROVIDER_SITE_OTHER): Payer: Medicare Other | Admitting: *Deleted

## 2023-06-29 VITALS — Ht 69.0 in | Wt 200.0 lb

## 2023-06-29 DIAGNOSIS — Z0000 Encounter for general adult medical examination without abnormal findings: Secondary | ICD-10-CM | POA: Diagnosis not present

## 2023-06-29 NOTE — Patient Instructions (Signed)
 [name] , Thank you for taking time to come for your Medicare Wellness Visit. I appreciate your ongoing commitment to your health goals. Please review the following plan we discussed and let me know if I can assist you in the future.   Referrals/Orders/Follow-Ups/Clinician Recommendations: None  This is a list of the screening recommended for you and due dates:  Health Maintenance  Topic Date Due   COVID-19 Vaccine (8 - 2024-25 season) 05/20/2023   Screening for Lung Cancer  05/08/2024   Medicare Annual Wellness Visit  06/28/2024   Colon Cancer Screening  06/22/2029   DTaP/Tdap/Td vaccine (3 - Td or Tdap) 12/03/2031   Pneumonia Vaccine  Completed   Flu Shot  Completed   Hepatitis C Screening  Completed   Zoster (Shingles) Vaccine  Completed   HPV Vaccine  Aged Out    Advanced directives: (Copy Requested) Please bring a copy of your health care power of attorney and living will to the office to be added to your chart at your convenience.  Next Medicare Annual Wellness Visit scheduled for next year: Yes 06/29/24 @ 8:10

## 2023-06-29 NOTE — Progress Notes (Signed)
 Subjective:   Christopher Villegas is a 68 y.o. male who presents for an Initial Medicare Annual Wellness Visit.  Visit Complete: Virtual I connected with  Christopher Villegas on 06/29/23 by a audio enabled telemedicine application and verified that I am speaking with the correct person using two identifiers. This patient declined Interactive audio and Acupuncturist. Therefore the visit was completed with audio only.   Patient Location: Home  Provider Location: Office/Clinic  I discussed the limitations of evaluation and management by telemedicine. The patient expressed understanding and agreed to proceed.  Vital Signs: Because this visit was a virtual/telehealth visit, some criteria may be missing or patient reported. Any vitals not documented were not able to be obtained and vitals that have been documented are patient reported.  Patient Medicare AWV questionnaire was completed by the patient on 06/25/23; I have confirmed that all information answered by patient is correct and no changes since this date.  Cardiac Risk Factors include: advanced age (>11men, >17 women);male gender;dyslipidemia;hypertension     Objective:    Today's Vitals   06/29/23 0806  Weight: 200 lb (90.7 kg)  Height: 5\' 9"  (1.753 m)   Body mass index is 29.53 kg/m.     06/29/2023    8:27 AM 10/20/2022   12:10 PM 06/22/2022    9:34 AM 07/01/2017   10:34 AM 06/16/2017    7:06 AM 04/06/2017    9:00 AM 04/30/2016   10:25 AM  Advanced Directives  Does Patient Have a Medical Advance Directive? Yes Yes Yes Yes Yes Yes Yes  Type of Estate agent of [place];Living will Living will;Healthcare Power of [employer] Power of [place];Living will Healthcare Power of [place];Living will Healthcare Power of [place];Living will Living will Healthcare Power of [place];Living will  Does patient want to make changes to medical advance directive?     No - Patient declined    Copy of  Healthcare Power of Attorney in Chart? No - copy requested No - copy requested   No - copy requested      Current Medications (verified) Outpatient Encounter Medications as of 06/29/2023  Medication Sig   albuterol  (VENTOLIN  HFA) 108 (90 Base) MCG/ACT inhaler Inhale 2 puffs into the lungs every 6 (six) hours as needed for wheezing or shortness of breath.   aspirin  EC 81 MG tablet Take 81 mg by mouth every other day. Swallow whole.   ezetimibe  (ZETIA ) 10 MG tablet Take 1 tablet (10 mg total) by mouth daily.   Fluticasone  Furoate (ARNUITY ELLIPTA ) 100 MCG/ACT AEPB Inhale into the lungs daily.   isosorbide  mononitrate (IMDUR ) 60 MG 24 hr tablet TAKE ONE TABLET BY MOUTH EVERY DAY   losartan  (COZAAR ) 50 MG tablet TAKE 1 TABLET BY MOUTH DAILY   metoprolol  tartrate (LOPRESSOR ) 25 MG tablet TAKE 1 TABLET BY MOUTH TWICE DAILY   rosuvastatin  (CRESTOR ) 10 MG tablet TAKE 1 TABLET BY MOUTH DAILY   nitroGLYCERIN  (NITROSTAT ) 0.4 MG SL tablet Place 1 tablet (0.4 mg total) under the tongue every 5 (five) minutes as needed for chest pain. (Patient not taking: Reported on 06/29/2023)   No facility-administered encounter medications on file as of 06/29/2023.    Allergies (verified) Plavix [clopidogrel]   History: Past Medical History:  Diagnosis Date   Asthma    CAD (coronary artery disease)    a. 06/2017 Cath: LM 30ost/d, LAD 47m, D1/2 large, nl, D3 small, LCX 20ost, 40p, OM1/2/3 nl, RCA 40/152m - CTO w/ L->R collats. RPDA  fills via collats from 1st Septal. EF 55-65%-->Med rx; b. 10/2022 Cath: 10/2022 Cath: LM 30ost/d, LAD 52m, LCx 25ost, 20p, RCA 18m, 155m CTO, RPDA fills via 1st septal. Nl R heart pressures-->Med Rx.   Cancer [facility])    Basal Cell carcinoma   Carotid arterial disease (HCC)    a. 2009 s/p R CEA; b. 05/2018 U/S: RICA 1-39%, RECA >50%, LICA 1-39%, LECA <50%.   GERD (gastroesophageal reflux disease)    History of colon polyps    History of echocardiogram    a. 10/2015 Echo: EF 55%. Mild TR/MR;  b. 10/2022 Echo: EF 55-60%, no rwma, nl diast fxn, nl RV fxn, mildly dil LA, no significant valvular disease.   Hyperlipidemia    Hypertension    Past Surgical History:  Procedure Laterality Date   BICEPS TENDON REPAIR  2021   CARDIAC DEFIBRILLATOR PLACEMENT  05/30/2023   CAROTID ENDARTERECTOMY Right 2009   COLONOSCOPY WITH PROPOFOL  N/A 06/16/2017   Procedure: COLONOSCOPY WITH PROPOFOL ;  Surgeon: Marnee Sink, MD;  Location: [facility];  Service: Endoscopy;  Laterality: N/A;   COLONOSCOPY WITH PROPOFOL  N/A 06/22/2022   Procedure: COLONOSCOPY WITH PROPOFOL ;  Surgeon: Marnee Sink, MD;  Location: [facility] ENDOSCOPY;  Service: Endoscopy;  Laterality: N/A;   CYST EXCISION PERINEAL     ESOPHAGOGASTRODUODENOSCOPY (EGD) WITH PROPOFOL  N/A 06/16/2017   Procedure: ESOPHAGOGASTRODUODENOSCOPY (EGD) WITH PROPOFOL ;  Surgeon: Marnee Sink, MD;  Location: [facility];  Service: Endoscopy;  Laterality: N/A;   LEFT HEART CATH AND CORONARY ANGIOGRAPHY N/A 07/01/2017   Procedure: LEFT HEART CATH AND CORONARY ANGIOGRAPHY;  Surgeon: Sammy Crisp, MD;  Location: [facility] INVASIVE CV LAB;  Service: Cardiovascular;  Laterality: N/A;   POLYPECTOMY  06/16/2017   Procedure: POLYPECTOMY;  Surgeon: Marnee Sink, MD;  Location: [facility];  Service: Endoscopy;;   RIGHT/LEFT HEART CATH AND CORONARY ANGIOGRAPHY Bilateral 10/20/2022   Procedure: RIGHT/LEFT HEART CATH AND CORONARY ANGIOGRAPHY;  Surgeon: Sammy Crisp, MD;  Location: [facility] INVASIVE CV LAB;  Service: Cardiovascular;  Laterality: Bilateral;   TONSILLECTOMY AND ADENOIDECTOMY     Family History  Problem Relation Age of Onset   Cancer Mother        breast   Hypertension Mother    Hyperlipidemia Mother    Arthritis Mother        osteo   Hypertension Father    Heart disease Father        CHF   Kidney failure Father        causing death   COPD Father    Alcohol abuse Father    Autoimmune disease Sister    Lupus Sister    Healthy  Son    Healthy Son    Social History   Socioeconomic History   Marital status: Married    Spouse name: Beth   Number of children: 2   Years of education: 16   Highest education level: Bachelor's degree (e.g., BA, AB, BS)  Occupational History   Occupation: [employer]  Tobacco Use   Smoking status: Former    Current packs/day: 0.00    Average packs/day: 1.5 packs/day for 45.0 years (67.5 ttl pk-yrs)    Types: Cigarettes    Start date: 09/28/1969    Quit date: 09/29/2014    Years since quitting: 8.7    Passive exposure: Past   Smokeless tobacco: Never  Vaping Use   Vaping status: Never Used  Substance and Sexual Activity   Alcohol use: No    Comment:  Former Alcoholic - sober since 03/20/1989   Drug use: No   Sexual activity: Yes  Other Topics Concern   Not on file  Social History Narrative   Married   Works 2 jobs   Social Drivers of Corporate investment banker Strain: Low Risk  (06/29/2023)   Overall Financial Resource Strain (CARDIA)    Difficulty of Paying Living Expenses: Not hard at all  Food Insecurity: No Food Insecurity (06/29/2023)   Hunger Vital Sign    Worried About Running Out of Food in the Last Year: Never true    Ran Out of Food in the Last Year: Never true  Transportation Needs: No Transportation Needs (06/29/2023)   PRAPARE - Administrator, Civil Service (Medical): No    Lack of Transportation (Non-Medical): No  Physical Activity: Inactive (06/29/2023)   Exercise Vital Sign    Days of Exercise per Week: 0 days    Minutes of Exercise per Session: 30 min  Stress: No Stress Concern Present (06/29/2023)   [employer] of Occupational Health - Occupational Stress Questionnaire    Feeling of Stress : Not at all  Social Connections: Moderately Integrated (06/29/2023)   Social Connection and Isolation Panel [NHANES]    Frequency of Communication with Friends and Family: More than three times a week    Frequency of Social  Gatherings with Friends and Family: Twice a week    Attends Religious Services: More than 4 times per year    Active Member of [employer] or Organizations: No    Attends Engineer, structural: Never    Marital Status: Married    Tobacco Counseling Counseling given: Not Answered   Clinical Intake:  Pre-visit preparation completed: Yes  Pain : No/denies pain     BMI - recorded: 29.53 Nutritional Status: BMI 25 -29 Overweight Nutritional Risks: None Diabetes: No  How often do you need to have someone help you when you read instructions, pamphlets, or other written materials from your doctor or pharmacy?: 1 - Never  Interpreter Needed?: No  Information entered by :: R. Belmont Valli LPN   Activities of Daily Living    06/29/2023    8:09 AM 06/25/2023   12:53 PM  In your present state of health, do you have any difficulty performing the following activities:  Hearing? 0 0  Vision? 0 0  Difficulty concentrating or making decisions? 0 0  Walking or climbing stairs? 0 0  Dressing or bathing? 0 0  Doing errands, shopping? 0 0  Preparing Food and eating ? N N  Using the Toilet? N N  In the past six months, have you accidently leaked urine? N N  Do you have problems with loss of bowel control? N N  Managing your Medications? N N  Managing your Finances? N N  Housekeeping or managing your Housekeeping? N N    Patient Care Team: Dellar Fenton, MD as PCP - General (Internal Medicine) End, Veryl Gottron, MD as PCP - Cardiology (Cardiology)  Indicate any recent Medical Services you may have received from other than [facility] providers in the past year (date may be approximate).     Assessment:   This is a routine wellness examination for Christopher Villegas.  Hearing/Vision screen Hearing Screening - Comments:: No issues Vision Screening - Comments:: glasses   Goals Addressed             This Visit's Progress    Patient Stated       Wants to start back  walking and be more active        Depression Screen    06/29/2023    8:21 AM 04/13/2023   10:03 AM 04/13/2022    3:16 PM 12/02/2021    9:43 AM 05/29/2020   10:05 AM 05/29/2018    9:04 AM 04/06/2017    8:59 AM  PHQ 2/9 Scores  PHQ - 2 Score 0 0 0 0 0 0 0  PHQ- 9 Score 0   0 0  0    Fall Risk    06/29/2023    8:12 AM 06/25/2023   12:53 PM 04/13/2023   10:02 AM 04/13/2022    3:16 PM 12/02/2021    9:43 AM  Fall Risk   Falls in the past year? 0 0 0 0 0  Number falls in past yr: 0  0 0 0  Injury with Fall? 0  0 0 0  Risk for fall due to : No Fall Risks  No Fall Risks No Fall Risks No Fall Risks  Follow up Falls prevention discussed;Falls evaluation completed  Falls evaluation completed Falls evaluation completed Falls evaluation completed    MEDICARE RISK AT HOME: Medicare Risk at Home Any stairs in or around the home?: (Patient-Rptd) Yes If so, are there any without handrails?: No Home free of loose throw rugs in walkways, pet beds, electrical cords, etc?: Yes Adequate lighting in your home to reduce risk of falls?: Yes Life alert?: No Use of a cane, walker or w/c?: No Grab bars in the bathroom?: No Shower chair or bench in shower?: Yes Elevated toilet seat or a handicapped toilet?: Yes  Cognitive Function:        06/29/2023    8:28 AM  6CIT Screen  What Year? 0 points  What month? 0 points  What time? 0 points  Count back from 20 0 points  Months in reverse 0 points  Repeat phrase 0 points  Total Score 0 points    Immunizations Immunization History  Administered Date(s) Administered   Fluad Quad(high Dose 65+) 04/01/2022   Influenza Split 03/14/2020   Influenza, High Dose Seasonal PF 03/25/2023   Influenza,inj,Quad PF,6+ Mos 04/01/2016   Influenza,inj,quad, With Preservative 04/02/2019   Influenza-Unspecified 03/15/2017, 04/24/2020   Moderna Covid-19 Fall Seasonal Vaccine 47yrs & older 09/03/2022   PFIZER(Purple Top)SARS-COV-2 Vaccination 07/05/2019, 08/01/2019, 03/26/2020,  09/26/2020   PNEUMOCOCCAL CONJUGATE-20 12/02/2021   Pfizer Covid-19 Vaccine Bivalent Booster 34yrs & up 03/08/2022   Tdap 03/08/2008, 12/02/2021   Unspecified SARS-COV-2 Vaccination 03/25/2023   Zoster Recombinant(Shingrix ) 06/30/2017, 09/07/2017   Zoster, Live 04/23/2013    TDAP status: Up to date  Flu Vaccine status: Up to date  Pneumococcal vaccine status: Up to date  Covid-19 vaccine status: Completed vaccines  Qualifies for Shingles Vaccine? Yes   Zostavax completed Yes   Shingrix  Completed?: Yes  Screening Tests Health Maintenance  Topic Date Due   Medicare Annual Wellness (AWV)  Never done   COVID-19 Vaccine (8 - 2024-25 season) 05/20/2023   Lung Cancer Screening  05/08/2024   Colonoscopy  06/22/2029   DTaP/Tdap/Td (3 - Td or Tdap) 12/03/2031   Pneumonia Vaccine 43+ Years old  Completed   INFLUENZA VACCINE  Completed   Hepatitis C Screening  Completed   Zoster Vaccines- Shingrix   Completed   HPV VACCINES  Aged Out    Health Maintenance  Health Maintenance Due  Topic Date Due   Medicare Annual Wellness (AWV)  Never done   COVID-19 Vaccine (8 - 2024-25 season) 05/20/2023  Colorectal cancer screening: Type of screening: Colonoscopy. Completed 06/2022. Repeat every 7 years  Lung Cancer Screening: (Low Dose CT Chest recommended if Age 54-80 years, 20 pack-year currently smoking OR have quit w/in 15years.) does qualify.    Additional Screening:  Hepatitis C Screening: does qualify; Completed 03/2017  Vision Screening: Recommended annual ophthalmology exams for early detection of glaucoma and other disorders of the eye. Is the patient up to date with their annual eye exam?  Yes  Who is the provider or what is the name of the office in which the patient attends annual eye exams? [facility] If pt is not established with a provider, would they like to be referred to a provider to establish care? No .   Dental Screening: Recommended annual dental exams for  proper oral hygiene    Community Resource Referral / Chronic Care Management: CRR required this visit?  No   CCM required this visit?  No    Plan:     I have personally reviewed and noted the following in the patient's chart:   Medical and social history Use of alcohol, tobacco or illicit drugs  Current medications and supplements including opioid prescriptions. Patient is not currently taking opioid prescriptions. Functional ability and status Nutritional status Physical activity Advanced directives List of other physicians Hospitalizations, surgeries, and ER visits in previous 12 months Vitals Screenings to include cognitive, depression, and falls Referrals and appointments  In addition, I have reviewed and discussed with patient certain preventive protocols, quality metrics, and best practice recommendations. A written personalized care plan for preventive services as well as general preventive health recommendations were provided to patient.     Felicitas Horse, LPN   07/02/1476   After Visit Summary: (MyChart) Due to this being a telephonic visit, the after visit summary with patients personalized plan was offered to patient via MyChart   Nurse Notes: None

## 2023-07-06 NOTE — Progress Notes (Addendum)
Cardiology Clinic Note   Date: 07/08/2023 ID: ROSHAD, HACK 1955-10-14, MRN [id number]  Primary Cardiologist:  Yvonne Kendall, MD  Patient Profile    Christopher Villegas is a 68 y.o. male who presents to the clinic today for hospital follow up.     Past medical history significant for: CAD. LHC 07/01/2017 (angina): Severe single-vessel CAD with CTO of mid RCA with left-to-right collaterals.  Mild to moderate CAD involving LMCA, LAD, LCx.  Recommend addition of isosorbide and aggressive secondary prevention. R/LHC 10/20/2022 (angina/dyspnea): Stable coronary arteries compared to 2019.  Normal left and right heart filling pressures.  Normal Fick cardiac output/index. LHC 05/22/2023 (cardiac arrest) performed at [facility] healthcare: Known CTO of RCA with left-to-right collaterals.  Mild disease noted in LCx. Echo 06/20/2023: EF 60 to 65%.  Normal RV size/function.  Trivial MR.  No significant pericardial effusion. Cardiac arrest. ICD implantation 05/30/2023 performed at [facility] healthcare: [facility] Scientific ICD. RV lead dislodgment with lead revision 05/31/2023. Hypertension. Hyperlipidemia. Lipid panel 05/23/2023: LDL 66, HDL 43, TG 72, total 123. Carotid artery disease. S/p right CEA 2009. Carotid artery duplex 11/16/2022: Bilateral ECA > 50% stenosed.  Bilateral extracranial vessels near normal with only minimal wall thickening or plaque. GERD. Asthma. CKD stage III. Former tobacco abuse.  In summary, patient was first evaluated by Dr. Okey Dupre on 06/22/2017 for chest pain as a self-referral.  He is status post right carotid endarterectomy in 2009.  Patient reported a several year history of indigestion described as substernal burning most frequently occurring with exertion that had been worsening over several months.  Cardiac workup with Dr. Gwen Pounds in May 2017 included normal echo and myocardial perfusion stress test.  EKG was normal but given multiple risk factors decision  was made to proceed with cardiac catheterization.  LHC revealed severe single-vessel CAD as detailed above.  He was medically managed.  In March 2024 patient complained of increased shortness of breath and fatigue.  Isosorbide was increased.  Echo showed normal LV/RV function with no RWMA, normal global strain, mild LAE.  Upon follow-up in May 2024 patient continued to have DOE unchanged after titration of nitrate therapy.  He underwent diagnostic R/LHC which showed stable coronary arteries compared to 2019 as detailed above.     History of Present Illness    Christopher Villegas is followed by Dr. Okey Dupre for the above outlined history.  Patient was last seen in the office by Dr. Okey Dupre on 03/16/2023 for routine follow-up.  He was doing well at that time with improved dyspnea.  He was exercising on a daily basis typically doing stretches and some walking.  No medication changes were made at that time.  Per review of care everywhere: Patient "presented to [facility] ED via EMS on 05/22/23 s/p cardiac arrest. Per EMS, he had a witnessed cardiac arrest with immediate bystander BLS with 1 shock delivered by AED. ROSC achieved after 6 minutes EMS ACLS. Patient was alert and oriented, HDS, and protecting his airway. EKG in the field concerning for inferior STEMI. Workup in the ED was significant for mild leukocytosis, AKI, mild lactic acidosis, and hyperglycemia. EKG showed NSR with no ST elevations. Emergent left heart cath on 12/8 showed known mid RCA total occlusion with no other significant lesions. LVEF > 55%. Chest CT showed fractures rib; he was seen by pain management for assistance. Cardiac MRI showed no evidence of myocardial scar, fibrosis, or prior infarct. Patient had a Dual chamber AICD implant on 05/30/23.  He had an RV lead dislodgement and had an RV lead revision on 05/31/23. He did positive for Covid 19 during this admission but did not require any therapies."  Patient was admitted on 06/02/2023 to  06/06/2023 for ICD site infection. He was treated with IV antibiotics.   Patient was readmitted to [facility] healthcare on 06/17/2023.  Per Care Everywhere: "Pt was seen by his PCP on 06/17/23 for worsening right sided rib pain and SOB. He was concerned given the 6 rib fx and sternum fx s/p SCD. PCP ordered a CXR that showed new small to moderate right pleural effusion with overlying subsegmental atelectasis. Patient was directed to come to ED as he continued to have SOB, rib pain, and noted fever of 101.32F at home. Patient endorses that he is unable to lay flat since the SCD.  Chest CT showed a moderate to large right pleural effusion. Pulmonary was consulted and he had a right sided Thoracentesis on 1/7 with 1.4L removed. He was diuresed a total 4.5L; weight on discharge is 203 lbs. Losartan was reduced d/t soft BPs."  Today, patient is accompanied by his wife. He is doing very well since his last hospital admission. Patient denies shortness of breath, dyspnea on exertion, lower extremity edema, orthopnea or PND. No chest pain, pressure, or tightness. No palpitations. He is going to continue to follow with EP at [facility]. Recent CT showed a small amount of fluid around his lung and was instructed to take Lasix through the weekend. He is pending repeat echo through [facility]. He and his wife would like to have their sons genetically tested. They were instructed by Dr. Clelia Croft (EP at [facility]) to pursue that with cardiology. Let patient and wife know I will get in touch with Dr. Okey Dupre for some guidance regarding that.      ROS: All other systems reviewed and are otherwise negative except as noted in History of Present Illness.  EKGs/Labs Reviewed    EKG Interpretation Date/Time:  Friday July 08 2023 15:54:48 EST Ventricular Rate:  71 PR Interval:  160 QRS Duration:  90 QT Interval:  404 QTC Calculation: 439 R Axis:   98  Text Interpretation: Normal sinus rhythm Rightward axis When compared with ECG of 11/11/2022 no  significant change Confirmed by Carlos Levering [phone]) on 07/08/2023 4:00:39 PM   06/17/2023: ALT 12; AST 18; BUN 22; Creatinine, Ser 1.37; Potassium 4.9; Sodium 132   06/17/2023: Hemoglobin 12.9; WBC 10.7   09/02/2022: TSH 1.438           Physical Exam    VS:  BP 137/71 (BP Location: Left Arm)   Pulse 71   Ht 5\' 9"  (1.753 m)   Wt 202 lb (91.6 kg)   SpO2 96%   BMI 29.83 kg/m  , BMI Body mass index is 29.83 kg/m.  GEN: Well nourished, well developed, in no acute distress. Neck: No JVD or carotid bruits. Cardiac:  RRR. No murmurs. No rubs or gallops.   Respiratory:  Respirations regular and unlabored. Clear to auscultation on the left and diminished breath sounds from mid to lower lobe on the right without rales, wheezing or rhonchi. GI: Soft, nontender, nondistended. Extremities: Radials/DP/PT 2+ and equal bilaterally. No clubbing or cyanosis. No edema.  Skin: Warm and dry, no rash. Neuro: Strength intact.  Assessment & Plan   CAD Cardiac catheterization x 3 (January 2019, May 2024, December 2024) showing CTO mid RCA with left-to-right collaterals and mild to moderate CAD LMCA, LAD, LCx.  Most recent LHC performed at [facility] healthcare in the setting of cardiac arrest.  Patient denies chest pain, pressure or tightness. He has some minimal residual soreness across sternum from CPR.  -Advance physical activity slowly as tolerated.  -Continue aspirin every other day, rosuvastatin, Zetia, isosorbide, metoprolol, as needed SL NTG.  Cardiac arrest Patient with witnessed cardiac arrest with immediate bystander BLS and 1 shock delivered by AED.  ROSC achieved after 6 minutes per EMS.  Patient underwent ICD implantation 05/30/2023 with RV lead dislodgment and revision on 05/31/2023.  Patient developed a site infection and was rehospitalized from 06/02/2023 to 06/06/2023. No site concerns since that time.  -Continue device management with EP at [facility].   Rib fractures Patient sustained 6  rib fractures and sternum fracture status post CPR.  He reports minimal soreness across sternum but much improved.   Pleural effusion Patient was readmitted to [facility] healthcare 06/17/2023 for shortness of breath and pleural effusion.  He underwent right thoracentesis on 06/21/2023.  He reports recent repeat CT showed a small pleural effusion. He was instructed to take Lasix through the weekend. He will undergo repeat echo with [facility] soon to ensure he has not developed a pericardial effusion. He has some minimal fatigue and dyspnea but is doing well.  -Continue management with [facility] pulmonary.  Hypertension BP today 137/71. No reports of headache or dizziness.  -Continue metoprolol, isosorbide.   Hyperlipidemia/statin intolerance LDL December 2024 66, at goal. -Continue rosuvastatin and Zetia.   Disposition: Return in 3 months or sooner as needed.          Signed, Etta Grandchild. Darbie Biancardi, DNP, NP-C

## 2023-07-07 DIAGNOSIS — J9811 Atelectasis: Secondary | ICD-10-CM | POA: Diagnosis not present

## 2023-07-07 DIAGNOSIS — Z95810 Presence of automatic (implantable) cardiac defibrillator: Secondary | ICD-10-CM | POA: Diagnosis not present

## 2023-07-07 DIAGNOSIS — Z950 Presence of cardiac pacemaker: Secondary | ICD-10-CM | POA: Diagnosis not present

## 2023-07-07 DIAGNOSIS — J90 Pleural effusion, not elsewhere classified: Secondary | ICD-10-CM | POA: Diagnosis not present

## 2023-07-08 ENCOUNTER — Ambulatory Visit: Payer: Medicare Other | Attending: Student | Admitting: Student

## 2023-07-08 ENCOUNTER — Encounter: Payer: Self-pay | Admitting: Student

## 2023-07-08 VITALS — BP 137/71 | HR 71 | Ht 69.0 in | Wt 202.0 lb

## 2023-07-08 DIAGNOSIS — I10 Essential (primary) hypertension: Secondary | ICD-10-CM | POA: Diagnosis not present

## 2023-07-08 DIAGNOSIS — J90 Pleural effusion, not elsewhere classified: Secondary | ICD-10-CM

## 2023-07-08 DIAGNOSIS — E785 Hyperlipidemia, unspecified: Secondary | ICD-10-CM

## 2023-07-08 DIAGNOSIS — I469 Cardiac arrest, cause unspecified: Secondary | ICD-10-CM | POA: Diagnosis not present

## 2023-07-08 DIAGNOSIS — S2249XA Multiple fractures of ribs, unspecified side, initial encounter for closed fracture: Secondary | ICD-10-CM

## 2023-07-08 DIAGNOSIS — S2249XD Multiple fractures of ribs, unspecified side, subsequent encounter for fracture with routine healing: Secondary | ICD-10-CM | POA: Diagnosis not present

## 2023-07-08 DIAGNOSIS — I2510 Atherosclerotic heart disease of native coronary artery without angina pectoris: Secondary | ICD-10-CM | POA: Diagnosis not present

## 2023-07-08 NOTE — Patient Instructions (Signed)
Medication Instructions:  Your Physician recommend you continue on your current medication as directed.    *If you need a refill on your cardiac medications before your next appointment, please call your pharmacy*   Lab Work: None ordered at this time    Follow-Up: At [facility], you and your health needs are our priority.  As part of our continuing mission to provide you with exceptional heart care, we have created designated Provider Care Teams.  These Care Teams include your primary Cardiologist (physician) and Advanced Practice Providers (APPs -  Physician Assistants and Nurse Practitioners) who all work together to provide you with the care you need, when you need it.  Your next appointment:   3 month(s)  Provider:   You may see Yvonne Kendall, MD   Please cancel 09/08/23 appt with Ward Givens

## 2023-07-11 ENCOUNTER — Encounter: Payer: Self-pay | Admitting: Internal Medicine

## 2023-07-11 ENCOUNTER — Other Ambulatory Visit: Payer: Self-pay | Admitting: Emergency Medicine

## 2023-07-11 DIAGNOSIS — I469 Cardiac arrest, cause unspecified: Secondary | ICD-10-CM

## 2023-07-11 NOTE — Progress Notes (Signed)
Ambulatory referral placed genetic testing for cardiac arrest  Pt called to notifiy

## 2023-07-11 NOTE — Telephone Encounter (Signed)
Please call and confirm he is doing ok.  Ok to schedule for met b lab.

## 2023-07-12 ENCOUNTER — Telehealth: Payer: Self-pay | Admitting: Internal Medicine

## 2023-07-12 DIAGNOSIS — I10 Essential (primary) hypertension: Secondary | ICD-10-CM

## 2023-07-12 NOTE — Telephone Encounter (Signed)
LMTCB

## 2023-07-12 NOTE — Telephone Encounter (Signed)
PATIENT WOULD LIKE A CALL BACK HE RECEIVED A CALL FROM THE OFFICE  THERE WHERE NO NOTES ON PATIENT CHART

## 2023-07-12 NOTE — Telephone Encounter (Signed)
See mychart.

## 2023-07-12 NOTE — Telephone Encounter (Unsigned)
Copied from CRM [id number]. Topic: General - Other >> Jul 12, 2023  4:55 PM Eunice Blase wrote: Reason for CRM: Pt returning call, please call pt [phone].

## 2023-07-13 DIAGNOSIS — J90 Pleural effusion, not elsewhere classified: Secondary | ICD-10-CM | POA: Diagnosis not present

## 2023-07-13 DIAGNOSIS — I2510 Atherosclerotic heart disease of native coronary artery without angina pectoris: Secondary | ICD-10-CM | POA: Diagnosis not present

## 2023-07-13 NOTE — Telephone Encounter (Signed)
Labs have been ordered and patient has been scheduled.

## 2023-07-13 NOTE — Telephone Encounter (Signed)
See phone note

## 2023-07-14 ENCOUNTER — Other Ambulatory Visit (INDEPENDENT_AMBULATORY_CARE_PROVIDER_SITE_OTHER): Payer: Medicare Other

## 2023-07-14 DIAGNOSIS — I10 Essential (primary) hypertension: Secondary | ICD-10-CM

## 2023-07-14 LAB — CBC WITH DIFFERENTIAL/PLATELET
Basophils Absolute: 0.1 10*3/uL (ref 0.0–0.1)
Basophils Relative: 1.7 % (ref 0.0–3.0)
Eosinophils Absolute: 0.1 10*3/uL (ref 0.0–0.7)
Eosinophils Relative: 2.3 % (ref 0.0–5.0)
HCT: 39.5 % (ref 39.0–52.0)
Hemoglobin: 13 g/dL (ref 13.0–17.0)
Lymphocytes Relative: 28.1 % (ref 12.0–46.0)
Lymphs Abs: 1.8 10*3/uL (ref 0.7–4.0)
MCHC: 32.9 g/dL (ref 30.0–36.0)
MCV: 90.2 fL (ref 78.0–100.0)
Monocytes Absolute: 0.7 10*3/uL (ref 0.1–1.0)
Monocytes Relative: 10.5 % (ref 3.0–12.0)
Neutro Abs: 3.6 10*3/uL (ref 1.4–7.7)
Neutrophils Relative %: 57.4 % (ref 43.0–77.0)
Platelets: 288 10*3/uL (ref 150.0–400.0)
RBC: 4.38 Mil/uL (ref 4.22–5.81)
RDW: 14.1 % (ref 11.5–15.5)
WBC: 6.3 10*3/uL (ref 4.0–10.5)

## 2023-07-14 LAB — BASIC METABOLIC PANEL
BUN: 19 mg/dL (ref 6–23)
CO2: 27 meq/L (ref 19–32)
Calcium: 8.9 mg/dL (ref 8.4–10.5)
Chloride: 102 meq/L (ref 96–112)
Creatinine, Ser: 1.19 mg/dL (ref 0.40–1.50)
GFR: 62.46 mL/min (ref >60.00)
Glucose, Bld: 83 mg/dL (ref 70–99)
Potassium: 4.7 meq/L (ref 3.5–5.1)
Sodium: 137 meq/L (ref 135–145)

## 2023-08-15 ENCOUNTER — Ambulatory Visit (INDEPENDENT_AMBULATORY_CARE_PROVIDER_SITE_OTHER): Payer: Medicare HMO | Admitting: Internal Medicine

## 2023-08-15 ENCOUNTER — Encounter: Payer: Self-pay | Admitting: Internal Medicine

## 2023-08-15 VITALS — BP 120/68 | HR 67 | Temp 98.0°F | Resp 16 | Ht 69.0 in | Wt 204.0 lb

## 2023-08-15 DIAGNOSIS — Z8674 Personal history of sudden cardiac arrest: Secondary | ICD-10-CM | POA: Diagnosis not present

## 2023-08-15 DIAGNOSIS — K080 Exfoliation of teeth due to systemic causes: Secondary | ICD-10-CM | POA: Diagnosis not present

## 2023-08-15 DIAGNOSIS — I10 Essential (primary) hypertension: Secondary | ICD-10-CM

## 2023-08-15 DIAGNOSIS — E785 Hyperlipidemia, unspecified: Secondary | ICD-10-CM | POA: Diagnosis not present

## 2023-08-15 DIAGNOSIS — R739 Hyperglycemia, unspecified: Secondary | ICD-10-CM | POA: Diagnosis not present

## 2023-08-15 DIAGNOSIS — I25118 Atherosclerotic heart disease of native coronary artery with other forms of angina pectoris: Secondary | ICD-10-CM

## 2023-08-15 DIAGNOSIS — I6523 Occlusion and stenosis of bilateral carotid arteries: Secondary | ICD-10-CM

## 2023-08-15 DIAGNOSIS — G720 Drug-induced myopathy: Secondary | ICD-10-CM

## 2023-08-15 DIAGNOSIS — T466X5A Adverse effect of antihyperlipidemic and antiarteriosclerotic drugs, initial encounter: Secondary | ICD-10-CM

## 2023-08-15 NOTE — Assessment & Plan Note (Signed)
 Continue crestor and zetia. Tolerating. Low cholesterol diet and exercise. Follow lipid panel and liver function tests.

## 2023-08-15 NOTE — Assessment & Plan Note (Signed)
 Tolerating low dose crestor and zetia. Continue.

## 2023-08-15 NOTE — Assessment & Plan Note (Signed)
 Saw cardiology 07/08/23 - stable. Recommended to continue aspirin every other day, crestor, zetia. He reports he is doing well. Feels better. Weighing daily. Average weight at home 195-196lbs. Breathing better. Walking. No chest pain reported.

## 2023-08-15 NOTE — Assessment & Plan Note (Signed)
 carotid artery stenosis status post right carotid endarterectomy. Continue risk factor modification.  Continue Crestor and Zetia.  Continue aspirin.

## 2023-08-15 NOTE — Assessment & Plan Note (Signed)
 S/p AICD - stable. Being followed by EP. ECHO 07/13/23 - EF 55-60%. Stable.

## 2023-08-15 NOTE — Assessment & Plan Note (Signed)
Continue losartan.  Blood pressure doing well.  Follow pressures.  Follow metabolic panel.  

## 2023-08-15 NOTE — Progress Notes (Signed)
 Subjective:    Patient ID: [name], male    DOB: 08/07/1955, 68 y.o.   MRN: [id number]  Patient here for  Chief Complaint  Patient presents with   Medical Management of Chronic Issues    HPI Here for a scheduled follow up. Recently hospitalized after a witnessed cardiac arrest - collapsed 05/22/23 - while waiting in line. Was pulseless. Immediate bystander CPR followed by ACLS measures by fire department. ECHO - EF 55%. S/p cardiac MRI 05/27/23 - no evidence of myocardial scar, fibrosis or prior infarct: EF 66.7%. Troponins peaked @ 543. s/p LHC 05/22/23. Known CTO of the RCA which fills via left-to-right collaterals. Otherwise mild disease noted in the LCx. 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 Last visit, was having persistent sob and some right side discomfort. CXR with pleural effusion. Was readmitted 06/17/23 - 06/22/23. CT chest 06/20/23 - moderate to large right pleural effusion. No evidence of pneumonia. Unchanged midsternal fracture. Slightly decreased retrosternal hemorrhage. S/p right sided thoracentesis (06/21/23). Diuresed 4.5L. discharge weight - 203lbs. Discharged on lasix 2d/week. Had f/u with EP 07/07/23 - AICD - stable. Continue imdur, losartan and toprol. Repeat CT showed a small pleural effusion. Lasix adjusted. Recommended f/u ECHO. F/u ECHO 07/13/23 - EF 55-60%, no pleural effusion visualized. Saw cardiology 07/08/23 - stable. Recommended to continue aspirin every other day, crestor, zetia. He reports he is doing well. Feels better. Weighing daily. Average weight at home 195-196lbs. Breathing better. Walking. No chest pain reported. No increased cough or congestion. No abdominal pain or bowel change. Is not taking lasix. Does have toothache. Planning to see his dentist today.    Past Medical History:  Diagnosis Date   Asthma    CAD (coronary artery disease)    a. 06/2017 Cath: LM 30ost/d, LAD 64m, D1/2 large, nl, D3 small,  LCX 20ost, 40p, OM1/2/3 nl, RCA 40/153m - CTO w/ L->R collats. RPDA fills via collats from 1st Septal. EF 55-65%-->Med rx; b. 10/2022 Cath: 10/2022 Cath: LM 30ost/d, LAD 62m, LCx 25ost, 20p, RCA 12m, 116m CTO, RPDA fills via 1st septal. Nl R heart pressures-->Med Rx.   Cancer [facility])    Basal Cell carcinoma   Carotid arterial disease (HCC)    a. 2009 s/p R CEA; b. 05/2018 U/S: RICA 1-39%, RECA >50%, LICA 1-39%, LECA <50%.   GERD (gastroesophageal reflux disease)    History of colon polyps    History of echocardiogram    a. 10/2015 Echo: EF 55%. Mild TR/MR; b. 10/2022 Echo: EF 55-60%, no rwma, nl diast fxn, nl RV fxn, mildly dil LA, no significant valvular disease.   Hyperlipidemia    Hypertension    Past Surgical History:  Procedure Laterality Date   BICEPS TENDON REPAIR  2021   CARDIAC DEFIBRILLATOR PLACEMENT  05/30/2023   CAROTID ENDARTERECTOMY Right 2009   COLONOSCOPY WITH PROPOFOL N/A 06/16/2017   Procedure: COLONOSCOPY WITH PROPOFOL;  Surgeon: Midge Minium, MD;  Location: [facility];  Service: Endoscopy;  Laterality: N/A;   COLONOSCOPY WITH PROPOFOL N/A 06/22/2022   Procedure: COLONOSCOPY WITH PROPOFOL;  Surgeon: Midge Minium, MD;  Location: [facility] ENDOSCOPY;  Service: Endoscopy;  Laterality: N/A;   CYST EXCISION PERINEAL     ESOPHAGOGASTRODUODENOSCOPY (EGD) WITH PROPOFOL N/A 06/16/2017   Procedure: ESOPHAGOGASTRODUODENOSCOPY (EGD) WITH PROPOFOL;  Surgeon: Midge Minium, MD;  Location: [facility];  Service: Endoscopy;  Laterality: N/A;   LEFT HEART CATH AND CORONARY ANGIOGRAPHY N/A 07/01/2017   Procedure: LEFT HEART  CATH AND CORONARY ANGIOGRAPHY;  Surgeon: Yvonne Kendall, MD;  Location: [facility] INVASIVE CV LAB;  Service: Cardiovascular;  Laterality: N/A;   POLYPECTOMY  06/16/2017   Procedure: POLYPECTOMY;  Surgeon: Midge Minium, MD;  Location: [facility];  Service: Endoscopy;;   RIGHT/LEFT HEART CATH AND CORONARY ANGIOGRAPHY Bilateral 10/20/2022   Procedure:  RIGHT/LEFT HEART CATH AND CORONARY ANGIOGRAPHY;  Surgeon: Yvonne Kendall, MD;  Location: [facility] INVASIVE CV LAB;  Service: Cardiovascular;  Laterality: Bilateral;   TONSILLECTOMY AND ADENOIDECTOMY     Family History  Problem Relation Age of Onset   Cancer Mother        breast   Hypertension Mother    Hyperlipidemia Mother    Arthritis Mother        osteo   Hypertension Father    Heart disease Father        CHF   Kidney failure Father        causing death   COPD Father    Alcohol abuse Father    Autoimmune disease Sister    Lupus Sister    Healthy Son    Healthy Son    Social History   Socioeconomic History   Marital status: Married    Spouse name: Beth   Number of children: 2   Years of education: 16   Highest education level: Bachelor's degree (e.g., BA, AB, BS)  Occupational History   Occupation: [employer]  Tobacco Use   Smoking status: Former    Current packs/day: 0.00    Average packs/day: 1.5 packs/day for 45.0 years (67.5 ttl pk-yrs)    Types: Cigarettes    Start date: 09/28/1969    Quit date: 09/29/2014    Years since quitting: 8.8    Passive exposure: Past   Smokeless tobacco: Never  Vaping Use   Vaping status: Never Used  Substance and Sexual Activity   Alcohol use: No    Comment: Former Alcoholic - sober since 03/20/1989   Drug use: No   Sexual activity: Yes  Other Topics Concern   Not on file  Social History Narrative   Married   Works 2 jobs   Social Drivers of Corporate investment banker Strain: Low Risk  (06/29/2023)   Overall Financial Resource Strain (CARDIA)    Difficulty of Paying Living Expenses: Not hard at all  Food Insecurity: No Food Insecurity (06/29/2023)   Hunger Vital Sign    Worried About Running Out of Food in the Last Year: Never true    Ran Out of Food in the Last Year: Never true  Transportation Needs: No Transportation Needs (06/29/2023)   PRAPARE - Administrator, Civil Service (Medical):  No    Lack of Transportation (Non-Medical): No  Physical Activity: Inactive (06/29/2023)   Exercise Vital Sign    Days of Exercise per Week: 0 days    Minutes of Exercise per Session: 30 min  Stress: No Stress Concern Present (06/29/2023)   [employer] of Occupational Health - Occupational Stress Questionnaire    Feeling of Stress : Not at all  Social Connections: Moderately Integrated (06/29/2023)   Social Connection and Isolation Panel [NHANES]    Frequency of Communication with Friends and Family: More than three times a week    Frequency of Social Gatherings with Friends and Family: Twice a week    Attends Religious Services: More than 4 times per year    Active Member of Clubs or Organizations: No  Attends Banker Meetings: Never    Marital Status: Married     Review of Systems  Constitutional:  Negative for appetite change and unexpected weight change.  HENT:  Negative for congestion and sinus pressure.   Respiratory:  Negative for cough, chest tightness and shortness of breath.   Cardiovascular:  Negative for chest pain, palpitations and leg swelling.  Gastrointestinal:  Negative for abdominal pain, diarrhea, nausea and vomiting.  Genitourinary:  Negative for difficulty urinating and dysuria.  Musculoskeletal:  Negative for joint swelling and myalgias.  Skin:  Negative for color change and rash.  Neurological:  Negative for dizziness and headaches.  Psychiatric/Behavioral:  Negative for agitation and dysphoric mood.        Objective:     BP 120/68   Pulse 67   Temp 98 F (36.7 C)   Resp 16   Ht 5\' 9"  (1.753 m)   Wt 204 lb (92.5 kg)   SpO2 98%   BMI 30.13 kg/m  Wt Readings from Last 3 Encounters:  08/15/23 204 lb (92.5 kg)  07/08/23 202 lb (91.6 kg)  06/29/23 200 lb (90.7 kg)    Physical Exam Vitals reviewed.  Constitutional:      General: He is not in acute distress.    Appearance: Normal appearance. He is well-developed.  HENT:      Head: Normocephalic and atraumatic.     Right Ear: External ear normal.     Left Ear: External ear normal.     Mouth/Throat:     Pharynx: No oropharyngeal exudate or posterior oropharyngeal erythema.  Eyes:     General: No scleral icterus.       Right eye: No discharge.        Left eye: No discharge.     Conjunctiva/sclera: Conjunctivae normal.  Cardiovascular:     Rate and Rhythm: Normal rate and regular rhythm.  Pulmonary:     Effort: Pulmonary effort is normal. No respiratory distress.     Breath sounds: Normal breath sounds.  Abdominal:     General: Bowel sounds are normal.     Palpations: Abdomen is soft.     Tenderness: There is no abdominal tenderness.  Musculoskeletal:        General: No swelling or tenderness.     Cervical back: Neck supple. No tenderness.  Lymphadenopathy:     Cervical: No cervical adenopathy.  Skin:    Findings: No erythema or rash.  Neurological:     Mental Status: He is alert.  Psychiatric:        Mood and Affect: Mood normal.        Behavior: Behavior normal.         Outpatient Encounter Medications as of 08/15/2023  Medication Sig   albuterol (VENTOLIN HFA) 108 (90 Base) MCG/ACT inhaler Inhale 2 puffs into the lungs every 6 (six) hours as needed for wheezing or shortness of breath.   aspirin EC 81 MG tablet Take 81 mg by mouth every other day. Swallow whole.   ezetimibe (ZETIA) 10 MG tablet Take 1 tablet (10 mg total) by mouth daily.   Fluticasone Furoate (ARNUITY ELLIPTA) 100 MCG/ACT AEPB Inhale into the lungs daily.   furosemide (LASIX) 20 MG tablet Take by mouth daily. Take for 3 days   isosorbide mononitrate (IMDUR) 60 MG 24 hr tablet TAKE ONE TABLET BY MOUTH EVERY DAY   losartan (COZAAR) 50 MG tablet TAKE 1 TABLET BY MOUTH DAILY   metoprolol tartrate (LOPRESSOR) 25 MG tablet  TAKE 1 TABLET BY MOUTH TWICE DAILY   nitroGLYCERIN (NITROSTAT) 0.4 MG SL tablet Place 1 tablet (0.4 mg total) under the tongue every 5 (five) minutes as needed for  chest pain.   rosuvastatin (CRESTOR) 10 MG tablet TAKE 1 TABLET BY MOUTH DAILY   No facility-administered encounter medications on file as of 08/15/2023.     Lab Results  Component Value Date   WBC 6.3 07/14/2023   HGB 13.0 07/14/2023   HCT 39.5 07/14/2023   PLT 288.0 07/14/2023   GLUCOSE 83 07/14/2023   CHOL 137 04/13/2023   TRIG 97.0 04/13/2023   HDL 51.10 04/13/2023   LDLCALC 66 04/13/2023   ALT 12 06/17/2023   AST 18 06/17/2023   NA 137 07/14/2023   K 4.7 07/14/2023   CL 102 07/14/2023   CREATININE 1.19 07/14/2023   BUN 19 07/14/2023   CO2 27 07/14/2023   TSH 1.438 09/02/2022   PSA 1.48 04/13/2023   INR 1.0 06/22/2017   HGBA1C 5.8 04/13/2023    CT CHEST LUNG CA SCREEN LOW DOSE W/O CM Result Date: 05/30/2023 CLINICAL DATA:  68 year old male with 38 pack-year history of smoking. Lung cancer screening. EXAM: CT CHEST WITHOUT CONTRAST LOW-DOSE FOR LUNG CANCER SCREENING TECHNIQUE: Multidetector CT imaging of the chest was performed following the standard protocol without IV contrast. RADIATION DOSE REDUCTION: This exam was performed according to the departmental dose-optimization program which includes automated exposure control, adjustment of the mA and/or kV according to patient size and/or use of iterative reconstruction technique. COMPARISON:  05/05/2022 FINDINGS: Cardiovascular: The heart size is normal. No substantial pericardial effusion. Coronary artery calcification is evident. Mild atherosclerotic calcification is noted in the wall of the thoracic aorta. Mediastinum/Nodes: No mediastinal lymphadenopathy. No evidence for gross hilar lymphadenopathy although assessment is limited by the lack of intravenous contrast on the current study. The esophagus has normal imaging features. There is no axillary lymphadenopathy. Lungs/Pleura: Centrilobular and paraseptal emphysema evident. Pulmonary nodules identified previously are stable in the interval no new suspicious pulmonary nodule  or mass. No focal airspace consolidation. No pleural effusion. Upper Abdomen: Tiny left adrenal nodule compatible with adenoma, stable. No followup imaging is recommended. Tiny calcified gallstone evident. Musculoskeletal: No worrisome lytic or sclerotic osseous abnormality. IMPRESSION: 1. Lung-RADS 2, benign appearance or behavior. Continue annual screening with low-dose chest CT without contrast in 12 months. 2. Cholelithiasis. 3. Aortic Atherosclerosis (ICD10-I70.0) and Emphysema (ICD10-J43.9). Electronically Signed   By: Kennith Center M.D.   On: 05/30/2023 13:22       Assessment & Plan:  Hyperlipidemia LDL goal <70 Assessment & Plan: Continue crestor and zetia. Tolerating. Low cholesterol diet and exercise. Follow lipid panel and liver function tests.   Orders: -     Hepatic function panel; Future -     Lipid panel; Future  Hyperglycemia Assessment & Plan: Previously documented. Recent A1c wnl. Reports has had no problems with elevated blood sugar. Continue diet and exercise.    History of cardiac arrest Assessment & Plan: S/p AICD - stable. Being followed by EP. ECHO 07/13/23 - EF 55-60%. Stable.    Essential hypertension Assessment & Plan: Continue losartan. Blood pressure doing well.  Follow pressures. Follow metabolic panel.   Orders: -     Basic metabolic panel; Future  Coronary artery disease of native artery of native heart with stable angina pectoris [facility]) Assessment & Plan:  Saw cardiology 07/08/23 - stable. Recommended to continue aspirin every other day, crestor, zetia. He reports he is doing well.  Feels better. Weighing daily. Average weight at home 195-196lbs. Breathing better. Walking. No chest pain reported.    Statin myopathy Assessment & Plan: Tolerating low dose crestor and zetia. Continue.    Bilateral carotid artery stenosis Assessment & Plan: carotid artery stenosis status post right carotid endarterectomy. Continue risk factor modification.  Continue  Crestor and Zetia.  Continue aspirin.      [place], MD

## 2023-08-15 NOTE — Assessment & Plan Note (Signed)
 Previously documented. Recent A1c wnl. Reports has had no problems with elevated blood sugar. Continue diet and exercise.

## 2023-09-07 ENCOUNTER — Other Ambulatory Visit (INDEPENDENT_AMBULATORY_CARE_PROVIDER_SITE_OTHER)

## 2023-09-07 ENCOUNTER — Encounter: Payer: Self-pay | Admitting: Internal Medicine

## 2023-09-07 DIAGNOSIS — E785 Hyperlipidemia, unspecified: Secondary | ICD-10-CM

## 2023-09-07 DIAGNOSIS — I10 Essential (primary) hypertension: Secondary | ICD-10-CM

## 2023-09-07 LAB — HEPATIC FUNCTION PANEL
ALT: 18 U/L (ref 0–53)
AST: 25 U/L (ref 0–37)
Albumin: 4 g/dL (ref 3.5–5.2)
Alkaline Phosphatase: 68 U/L (ref 39–117)
Bilirubin, Direct: 0.2 mg/dL (ref 0.0–0.3)
Total Bilirubin: 0.8 mg/dL (ref 0.2–1.2)
Total Protein: 6.6 g/dL (ref 6.0–8.3)

## 2023-09-07 LAB — BASIC METABOLIC PANEL
BUN: 21 mg/dL (ref 6–23)
CO2: 24 meq/L (ref 19–32)
Calcium: 8.8 mg/dL (ref 8.4–10.5)
Chloride: 106 meq/L (ref 96–112)
Creatinine, Ser: 1.3 mg/dL (ref 0.40–1.50)
GFR: 56.11 mL/min — ABNORMAL LOW (ref >60.00)
Glucose, Bld: 97 mg/dL (ref 70–99)
Potassium: 4.7 meq/L (ref 3.5–5.1)
Sodium: 136 meq/L (ref 135–145)

## 2023-09-07 LAB — LIPID PANEL
Cholesterol: 118 mg/dL (ref 0–200)
HDL: 42.6 mg/dL (ref >39.00)
LDL Cholesterol: 62 mg/dL (ref 0–99)
NonHDL: 75.64
Total CHOL/HDL Ratio: 3
Triglycerides: 68 mg/dL (ref 0.0–149.0)
VLDL: 13.6 mg/dL (ref 0.0–40.0)

## 2023-09-08 ENCOUNTER — Ambulatory Visit: Payer: Medicare HMO | Admitting: Nurse Practitioner

## 2023-09-23 ENCOUNTER — Other Ambulatory Visit: Payer: Self-pay | Admitting: Internal Medicine

## 2023-09-26 DIAGNOSIS — Z95810 Presence of automatic (implantable) cardiac defibrillator: Secondary | ICD-10-CM | POA: Diagnosis not present

## 2023-09-26 DIAGNOSIS — I469 Cardiac arrest, cause unspecified: Secondary | ICD-10-CM | POA: Diagnosis not present

## 2023-10-07 ENCOUNTER — Ambulatory Visit: Payer: Medicare Other | Attending: Internal Medicine | Admitting: Internal Medicine

## 2023-10-07 ENCOUNTER — Encounter: Payer: Self-pay | Admitting: Internal Medicine

## 2023-10-07 VITALS — BP 120/68 | HR 69 | Resp 17 | Ht 69.0 in | Wt 201.0 lb

## 2023-10-07 DIAGNOSIS — I6529 Occlusion and stenosis of unspecified carotid artery: Secondary | ICD-10-CM | POA: Diagnosis not present

## 2023-10-07 DIAGNOSIS — I469 Cardiac arrest, cause unspecified: Secondary | ICD-10-CM

## 2023-10-07 DIAGNOSIS — E785 Hyperlipidemia, unspecified: Secondary | ICD-10-CM | POA: Diagnosis not present

## 2023-10-07 DIAGNOSIS — I25118 Atherosclerotic heart disease of native coronary artery with other forms of angina pectoris: Secondary | ICD-10-CM | POA: Diagnosis not present

## 2023-10-07 NOTE — Patient Instructions (Signed)
 Medication Instructions:  Your Physician recommend you continue on your current medication as directed.    *If you need a refill on your cardiac medications before your next appointment, please call your pharmacy*   Follow-Up: At [facility], you and your health needs are our priority.  As part of our continuing mission to provide you with exceptional heart care, our providers are all part of one team.  This team includes your primary Cardiologist (physician) and Advanced Practice Providers or APPs (Physician Assistants and Nurse Practitioners) who all work together to provide you with the care you need, when you need it.  Your next appointment:   6 month(s)  Provider:   You may see Sammy Crisp, MD or one of the following Advanced Practice Providers on your designated Care Team:   Laneta Pintos, NP Gildardo Labrador, PA-C Varney Gentleman, PA-C Cadence [place], PA-C Ronald Cockayne, NP Morey Ar, NP    We recommend signing up for the patient portal called "MyChart".  Sign up information is provided on this After Visit Summary.  MyChart is used to connect with patients for Virtual Visits (Telemedicine).  Patients are able to view lab/test results, encounter notes, upcoming appointments, etc.  Non-urgent messages can be sent to your provider as well.   To learn more about what you can do with MyChart, go to [email or web address].

## 2023-10-07 NOTE — Progress Notes (Signed)
 Cardiology Office Note:  .   Date:  10/08/2023  ID:  Christopher Villegas, DOB [birth year], MRN [id number] PCP: Christopher Fenton, MD  [facility] Providers Cardiologist:  Christopher Crisp, MD     History of Present Illness: .   Christopher Villegas is a 68 y.o. male with history of coronary artery disease (CTO of RCA), cardiac arrest status post ICD (05/2023), hypertension, hyperlipidemia, carotid artery stenosis status post right carotid endarterectomy (2009), asthma, GERD, and remote tobacco use, who presents for follow-up of coronary artery disease and cardiac arrest.  He was seen in late January by Christopher Bob, NP, at which time he was doing well.  He had been readmitted at [facility] in early January because of worsening right sided rib pain and shortness of breath with associated rib and sternal fractures.  He also had a fever at home.  CT of the chest showed a moderate to large right pleural effusion leading to thoracentesis.  Today, Christopher Villegas reports that he is feeling remarkably well.  His stamina seems to be improving every day.  He is trying to walk regularly with his wife.  He denies chest pain, shortness of breath, palpitations, lightheadedness, and edema.  He continues to follow-up with electrophysiology at [facility] following ICD implantation in 05/2023.  He is also scheduled to meet with our clinical geneticist, Dr. Drexel Villegas, next month.  ROS: See HPI  Studies Reviewed: .        Risk Assessment/Calculations:             Physical Exam:   VS:  BP 120/68 (BP Location: Left Arm, Patient Position: Sitting, Cuff Size: Normal)   Pulse 69   Resp 17   Ht 5\' 9"  (1.753 m)   Wt 201 lb (91.2 kg)   SpO2 96%   BMI 29.68 kg/m    Wt Readings from Last 3 Encounters:  10/07/23 201 lb (91.2 kg)  08/15/23 204 lb (92.5 kg)  07/08/23 202 lb (91.6 kg)    General:  NAD. Neck: No JVD or HJR. Lungs: Clear to auscultation bilaterally without wheezes or crackles. Heart: Regular rate and  rhythm without murmurs, rubs, or gallops. Abdomen: Soft, nontender, nondistended. Extremities: No lower extremity edema.  ASSESSMENT AND PLAN: .    Coronary artery disease with stable angina: No chest pain reported.  Stamina improving following complicated hospital course after sudden cardiac death and successful resuscitation in 05/2023.  Catheterization at that time showed known CTO of RCA and otherwise nonobstructive CAD in the left system.  Continue with aspirin , ezetimibe , and rosuvastatin  for secondary prevention, as well as metoprolol  tartrate and isosorbide  mononitrate for antianginal therapy.  Hyperlipidemia: Lipids well-controlled on last check in 08/2023 with LDL of 62.  Continue rosuvastatin  10 mg daily and ezetimibe  10 mg daily due to intolerance of more aggressive statin therapy due to myalgias.  Sudden cardiac death status post ICD: No palpitations, syncope, or ICD shocks.  Workup at that time showed stable CAD with CTO of RCA.  Cardiac MRI demonstrated normal LVEF without any evidence of scar or infiltrative cardiomyopathy.  Continue metoprolol  tartrate and ongoing follow-up of ICD through Dr[facility] Mason Villegas at [facility] in [place].  I think it is reasonable to move forward with genetics consultation next month to look for potential heritable causes of his SCD and screening of his children as appropriate.  Carotid artery stenosis: Asymptomatic.  Continue aspirin  and lipid therapy as detailed above.  Carotid Doppler already ordered, to  be done in 11/2023 to ensure stability.    Dispo: Return to clinic in 6 months.  Signed, Christopher Crisp, MD

## 2023-10-08 DIAGNOSIS — I469 Cardiac arrest, cause unspecified: Secondary | ICD-10-CM | POA: Insufficient documentation

## 2023-10-10 DIAGNOSIS — J4530 Mild persistent asthma, uncomplicated: Secondary | ICD-10-CM | POA: Diagnosis not present

## 2023-10-10 DIAGNOSIS — J45990 Exercise induced bronchospasm: Secondary | ICD-10-CM | POA: Diagnosis not present

## 2023-10-10 DIAGNOSIS — J432 Centrilobular emphysema: Secondary | ICD-10-CM | POA: Diagnosis not present

## 2023-10-10 DIAGNOSIS — J90 Pleural effusion, not elsewhere classified: Secondary | ICD-10-CM | POA: Diagnosis not present

## 2023-10-25 ENCOUNTER — Ambulatory Visit: Payer: Medicare Other | Attending: Genetic Counselor | Admitting: Genetic Counselor

## 2023-10-25 DIAGNOSIS — I469 Cardiac arrest, cause unspecified: Secondary | ICD-10-CM

## 2023-10-28 NOTE — Progress Notes (Signed)
 Referral Reason Christopher Villegas was referred for genetic consult and testing for sudden cardiac arrest.  Personal Medical Information Christopher Villegas (III.2 on pedigree) is a 68 y.o. Caucasian gentleman who is here today with his wife, Jerlene Moody. 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   On Dec 8th he and Beth were at [place] to watch the Christmas lights and tells me that they had to walk a long way from the parking lot to the show. As they walked, he had to stop a couple of times to catch his breath and then recollects telling Jerlene Moody that he was dizzy when he collapsed. A PA happened to be in line with his family at that time and administered CPR when he found out that he did not have a pulse. EMS was summoned and he was taken to [facility] after being shocked thrice. He underwent extensive cardiac studies that did not detect cardiac structural and functional issues. Does report seeing a stable coronary artery blockage from 2019. 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 An ICD was implanted after he recovered from COVID19 and later at f/u was found to have an infection that was treated with IV antibiotics. He later had nearly 1.5 L of fluid drained from his lungs and since Jan 2025 has had no medical issues. Denies having any major symptoms currently. He is in touch with [place], the PA who administered CPR in [place] who is close to the family and meet up often for dinner.   Traditional Risk factors Reviewed the risk factors that can also lead to SCA, namely obstructive heart disease, low EF, long QT or BrS EKG profile, hypokalemia or drug overdose. Patient denies having any of this prior to his cardiac arrest.   Family history  Relation to Proband Pedigree # Current age Heart condition/age of onset Notes  Sons, 2 IV.1, IV.2 35, 27 None Will lives in [place], [place] and has a band called [employer] Mother Epifania Haskell  has his own AI firm in [place], [place]  Grandchildren None           Sister III.1 Deceased None Died of COVID19 @ 53. No biological kids, adopted 2.        Father II.7 Deceased None Died of ESRD @ 59  Paternal uncles and aunts II.1- II.6 Deceased None II.3-II.6-Died of ? @?  Paternal grandfather I.1 Deceased None Died @ 49s- stomach cancer  Paternal grandmother I.2 Deceased None Died @ 30s at childbirth of 7th child        Mother II.8 Deceased None Died @ 60- old age   Maternal grandfather I.3 Deceased Pacemaker @ 68s Died @ 23- after broken hip  Maternal grandmother I.4 Deceased None Died @ 72- old age   89 Genetic Consultation Notes I explained to patient that sudden cardiac death in an older population is usually associated with coronary artery disease. When a young and otherwise healthy individual suffers a sudden cardiac arrest or death, it is usually from an inherited arrhythmia and/or cardiomyopathy. In addition, structural -congenital coronary artery abnormalities, cardiac tamponade can lead to SCA.    He was counseled on the genetics of the channelopathies, specifically Brugada syndrome (BrS), Long QT syndrome (LQTS), and Catecholaminergic Polymorphic Ventricular Tachycardia (CPVT) as well as cardiomyopathies including hypertrophic, arrhythmogenic and dilated cardiomyopathy.  We discussed autosomal dominant inheritance, incomplete penetrance and variable expression associated  with these conditions.   We walked through the process of genetic testing and discussed the potential outcomes of genetic testing. Genetic testing identifies a pathogenic variant in 10% of unexplained cardiac arrest survivors Cynthea Drier et al., 2022) where a majority of disease-causing variants were found to be located in genes associated with cardiomyopathy in the absence of an overt cardiomyopathy diagnosis.      Patient should be aware that there are three possible outcomes of genetic testing; namely positive,  negative and variant of unknown significance. A positive outcome can be expected in patients that do not have risk factors for the above inherited conditions, present early in life with increased severity and have a family history of sudden cardiac death and/or a relative that has been diagnosed with an inherited condition. I informed them that the genetic basis of SCA and most individual conditions that predispose to SCA remain incompletely understood. While a negative test may indicate that this is an idiopathic condition this may necessitate routine clinical surveillance in first-degree relatives. Variants of unknown significance (VUS) can be obtained. A VUS is so classified if the variant is not well understood as very few individuals have been reported to harbor  this variant or its role in gene function has not been elucidated.  I also reviewed genetic testing of first-degree family members and subsequent follow-up of those at risk of inheriting the underlying genetic condition. I explained to him that genetic evaluation may influence final diagnosis, treatment recommendations, and family screening.   Impression  In summary, Christopher Villegas experienced a SCD at age 39 in the absence of risk factors that can cause this. 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   Genetic testing for inherited arrhythmias and cardiomyopathies that can lead to SCA can be considered. The genetic test will help confirm his diagnosis and identify the genetic basis of his disease. Since this is an autosomal dominant condition, a positive test result will help identify first-degree family members that may harbor  the mutation and are at risk of developing this  condition. Appropriate cardiology follow-up and lifestyle management can then be directed to those genotype-positive family members.   In addition, we discussed the protections afforded by the Genetic Information Non-Discrimination Act (GINA). I explained to the patient that GINA protects them from losing their employment or health insurance based on their genotype. However, these protections do not cover life insurance and disability. Patient verbalized understanding of this and reports that his children do not have life insurance coverage.  Please note that the patient has not been counseled in this visit on other personal, cultural, or ethical issues that they may face due to their heart condition.   Plan After a thorough discussion of the risk and benefits of genetic testing for sudden cardiac arrest, Christopher Villegas declines genetic testing for SCA due to insurance non-coverage for this test.   Verdis Glade, Ph.D, [facility] Clinical Molecular Geneticist

## 2023-11-29 ENCOUNTER — Other Ambulatory Visit: Payer: Self-pay | Admitting: Internal Medicine

## 2023-12-14 DIAGNOSIS — K080 Exfoliation of teeth due to systemic causes: Secondary | ICD-10-CM | POA: Diagnosis not present

## 2023-12-15 ENCOUNTER — Ambulatory Visit: Admitting: Internal Medicine

## 2023-12-15 ENCOUNTER — Encounter: Payer: Self-pay | Admitting: Internal Medicine

## 2023-12-15 VITALS — BP 118/68 | HR 63 | Temp 98.0°F | Resp 16 | Ht 69.0 in | Wt 200.0 lb

## 2023-12-15 DIAGNOSIS — G720 Drug-induced myopathy: Secondary | ICD-10-CM

## 2023-12-15 DIAGNOSIS — I25118 Atherosclerotic heart disease of native coronary artery with other forms of angina pectoris: Secondary | ICD-10-CM | POA: Diagnosis not present

## 2023-12-15 DIAGNOSIS — Z8674 Personal history of sudden cardiac arrest: Secondary | ICD-10-CM

## 2023-12-15 DIAGNOSIS — I10 Essential (primary) hypertension: Secondary | ICD-10-CM | POA: Diagnosis not present

## 2023-12-15 DIAGNOSIS — J45909 Unspecified asthma, uncomplicated: Secondary | ICD-10-CM

## 2023-12-15 DIAGNOSIS — Z87891 Personal history of nicotine dependence: Secondary | ICD-10-CM

## 2023-12-15 DIAGNOSIS — E785 Hyperlipidemia, unspecified: Secondary | ICD-10-CM | POA: Diagnosis not present

## 2023-12-15 DIAGNOSIS — R739 Hyperglycemia, unspecified: Secondary | ICD-10-CM

## 2023-12-15 DIAGNOSIS — N1831 Chronic kidney disease, stage 3a: Secondary | ICD-10-CM

## 2023-12-15 DIAGNOSIS — T466X5A Adverse effect of antihyperlipidemic and antiarteriosclerotic drugs, initial encounter: Secondary | ICD-10-CM

## 2023-12-15 LAB — HEPATIC FUNCTION PANEL
ALT: 29 U/L (ref 0–53)
AST: 39 U/L — ABNORMAL HIGH (ref 0–37)
Albumin: 4.4 g/dL (ref 3.5–5.2)
Alkaline Phosphatase: 55 U/L (ref 39–117)
Bilirubin, Direct: 0.2 mg/dL (ref 0.0–0.3)
Total Bilirubin: 0.9 mg/dL (ref 0.2–1.2)
Total Protein: 6.9 g/dL (ref 6.0–8.3)

## 2023-12-15 LAB — BASIC METABOLIC PANEL WITH GFR
BUN: 21 mg/dL (ref 6–23)
CO2: 28 meq/L (ref 19–32)
Calcium: 9 mg/dL (ref 8.4–10.5)
Chloride: 104 meq/L (ref 96–112)
Creatinine, Ser: 1.41 mg/dL (ref 0.40–1.50)
GFR: 50.81 mL/min — ABNORMAL LOW (ref >60.00)
Glucose, Bld: 97 mg/dL (ref 70–99)
Potassium: 4.9 meq/L (ref 3.5–5.1)
Sodium: 137 meq/L (ref 135–145)

## 2023-12-15 LAB — LIPID PANEL
Cholesterol: 133 mg/dL (ref 0–200)
HDL: 49.8 mg/dL (ref >39.00)
LDL Cholesterol: 65 mg/dL (ref 0–99)
NonHDL: 83
Total CHOL/HDL Ratio: 3
Triglycerides: 90 mg/dL (ref 0.0–149.0)
VLDL: 18 mg/dL (ref 0.0–40.0)

## 2023-12-15 LAB — TSH: TSH: 1.31 u[IU]/mL (ref 0.35–5.50)

## 2023-12-15 NOTE — Assessment & Plan Note (Signed)
 Continue losartan . Blood pressure doing well.  Follow pressures. No changes in medication. Check metabolic panel today.

## 2023-12-15 NOTE — Assessment & Plan Note (Signed)
 Previously documented. Continue diet and exercise. Check A1c today.

## 2023-12-15 NOTE — Assessment & Plan Note (Signed)
 Saw cardiology 07/08/23 - stable. Recommended to continue aspirin  every other day, crestor , zetia . He reports he is doing well. Breathing better. Walking. No chest pain reported. No change in medication.

## 2023-12-15 NOTE — Assessment & Plan Note (Signed)
 S/p AICD - stable. Being followed by EP. ECHO 07/13/23 - EF 55-60%. Stable. Walking. Stable.

## 2023-12-15 NOTE — Assessment & Plan Note (Signed)
 Continue crestor and zetia. Tolerating. Low cholesterol diet and exercise. Follow lipid panel and liver function tests.

## 2023-12-15 NOTE — Assessment & Plan Note (Signed)
 Quit smoking 6 years ago.  States smoked for 45 years. Planning follow up low dose CT can in 04/2024.

## 2023-12-15 NOTE — Assessment & Plan Note (Signed)
Avoid antiinflammatories.  Continue losartan.  Follow metabolic panel.  

## 2023-12-15 NOTE — Assessment & Plan Note (Signed)
 Has seen Dr Shellia.  Has albuterol  to take prn.  Continue arnuity. Discussed today. Breathing stable. Uses albuterol  prior to exercise.

## 2023-12-15 NOTE — Assessment & Plan Note (Signed)
 Tolerating low dose crestor and zetia. Continue.

## 2023-12-15 NOTE — Progress Notes (Signed)
 Subjective:    Patient ID: Christopher Villegas, male    DOB: 04-Feb-1956, 68 y.o.   MRN: [id number]  Patient here for  Chief Complaint  Patient presents with   Medical Management of Chronic Issues    HPI Here for a scheduled follow up - follow up regarding CAD, hypercholesterolemia, hypertension and hyperglycemia. S/p ICD implantation 05/2023. Saw cardiology 10/07/23 - recommended continuing aspirin , ezetimibe  and crestor , as well as metoprolol  and isosorbide . F/u carotid ultrasound - 11/2023. Had f/u with pulmonary 10/10/23 - leg cramps from breztri  and dulera . Breo did not help. Recommended airsupra prn. Effusion cleared. Recommended low dose CT chest 04/2024. He is walking daily. No chest pain or sob reported. No cough or congestion. No abdominal pain or bowel change reported.    Past Medical History:  Diagnosis Date   Asthma    CAD (coronary artery disease)    a. 06/2017 Cath: LM 30ost/d, LAD 70m, D1/2 large, nl, D3 small, LCX 20ost, 40p, OM1/2/3 nl, RCA 40/125m - CTO w/ L->R collats. RPDA fills via collats from 1st Septal. EF 55-65%-->Med rx; b. 10/2022 Cath: 10/2022 Cath: LM 30ost/d, LAD 66m, LCx 25ost, 20p, RCA 18m, 147m CTO, RPDA fills via 1st septal. Nl R heart pressures-->Med Rx.   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 2009 s/p R CEA; b. 05/2018 U/S: RICA 1-39%, RECA >50%, LICA 1-39%, LECA <50%.   GERD (gastroesophageal reflux disease)    History of colon polyps    History of echocardiogram    a. 10/2015 Echo: EF 55%. Mild TR/MR; b. 10/2022 Echo: EF 55-60%, no rwma, nl diast fxn, nl RV fxn, mildly dil LA, no significant valvular disease.   Hyperlipidemia    Hypertension    Past Surgical History:  Procedure Laterality Date   BICEPS TENDON REPAIR  2021   CARDIAC DEFIBRILLATOR PLACEMENT  05/30/2023   CAROTID ENDARTERECTOMY Right 2009   COLONOSCOPY WITH PROPOFOL  N/A 06/16/2017   Procedure: COLONOSCOPY WITH  PROPOFOL ;  Surgeon: Jinny Carmine, MD;  Location: [facility];  Service: Endoscopy;  Laterality: N/A;   COLONOSCOPY WITH PROPOFOL  N/A 06/22/2022   Procedure: COLONOSCOPY WITH PROPOFOL ;  Surgeon: Jinny Carmine, MD;  Location: [facility] ENDOSCOPY;  Service: Endoscopy;  Laterality: N/A;   CYST EXCISION PERINEAL     ESOPHAGOGASTRODUODENOSCOPY (EGD) WITH PROPOFOL  N/A 06/16/2017   Procedure: ESOPHAGOGASTRODUODENOSCOPY (EGD) WITH PROPOFOL ;  Surgeon: Jinny Carmine, MD;  Location: [facility];  Service: Endoscopy;  Laterality: N/A;   LEFT HEART CATH AND CORONARY ANGIOGRAPHY N/A 07/01/2017   Procedure: LEFT HEART CATH AND CORONARY ANGIOGRAPHY;  Surgeon: Mady Bruckner, MD;  Location: [facility] IN[facility] CV LAB;  Service: Cardiovascular;  Laterality: N/A;   POLYPECTOMY  06/16/2017   Procedure: POLYPECTOMY;  Surgeon: Jinny Carmine, MD;  Location: [facility];  Service: Endoscopy;;   RIGHT/LEFT HEART CATH AND CORONARY ANGIOGRAPHY Bilateral 10/20/2022   Procedure: RIGHT/LEFT HEART CATH AND CORONARY ANGIOGRAPHY;  Surgeon: Mady Bruckner, MD;  Location: [facility] INVASIVE CV LAB;  Service: Cardiovascular;  Laterality: Bilateral;   TONSILLECTOMY AND ADENOIDECTOMY     Family History  Problem Relation Age of Onset   Cancer Mother        breast   Hypertension Mother    Hyperlipidemia Mother    Arthritis Mother        osteo   Hypertension Father    Heart disease Father  CHF   Kidney failure Father        causing death   COPD Father    Alcohol abuse Father    Autoimmune disease Sister    Lupus Sister    Healthy Son    Healthy Son    Social History   Socioeconomic History   Marital status: Married    Spouse name: Beth   Number of children: 2   Years of education: 16   Highest education level: Bachelor's degree (e.g., BA, AB, BS)  Occupational History   Occupation: [employer]  Tobacco Use   Smoking status: Former    Current packs/day: 0.00    Average packs/day:  1.5 packs/day for 45.0 years (67.5 ttl pk-yrs)    Types: Cigarettes    Start date: 09/28/1969    Quit date: 09/29/2014    Years since quitting: 9.2    Passive exposure: Past   Smokeless tobacco: Never  Vaping Use   Vaping status: Never Used  Substance and Sexual Activity   Alcohol use: No    Comment: Former Alcoholic - sober since 03/20/1989   Drug use: No   Sexual activity: Yes  Other Topics Concern   Not on file  Social History Narrative   Married   Works 2 jobs   Social Drivers of Corporate investment banker Strain: Low Risk  (12/11/2023)   Overall Financial Resource Strain (CARDIA)    Difficulty of Paying Living Expenses: Not hard at all  Food Insecurity: No Food Insecurity (12/11/2023)   Hunger Vital Sign    Worried About Running Out of Food in the Last Year: Never true    Ran Out of Food in the Last Year: Never true  Transportation Needs: No Transportation Needs (12/11/2023)   PRAPARE - Administrator, Civil Service (Medical): No    Lack of Transportation (Non-Medical): No  Physical Activity: Sufficiently Active (12/11/2023)   Exercise Vital Sign    Days of Exercise per Week: 6 days    Minutes of Exercise per Session: 60 min  Stress: No Stress Concern Present (12/11/2023)   [employer] of Occupational Health - Occupational Stress Questionnaire    Feeling of Stress: Not at all  Social Connections: Moderately Integrated (12/11/2023)   Social Connection and Isolation Panel    Frequency of Communication with Friends and Family: More than three times a week    Frequency of Social Gatherings with Friends and Family: Once a week    Attends Religious Services: Patient declined    Database administrator or Organizations: Yes    Attends Banker Meetings: Patient declined    Marital Status: Married     Review of Systems  Constitutional:  Negative for appetite change and unexpected weight change.  HENT:  Negative for congestion and sinus pressure.    Respiratory:  Negative for cough, chest tightness and shortness of breath.   Cardiovascular:  Negative for chest pain, palpitations and leg swelling.  Gastrointestinal:  Negative for abdominal pain, diarrhea, nausea and vomiting.  Genitourinary:  Negative for difficulty urinating and dysuria.  Musculoskeletal:  Negative for joint swelling and myalgias.  Skin:  Negative for color change and rash.  Neurological:  Negative for dizziness and headaches.  Psychiatric/Behavioral:  Negative for agitation and dysphoric mood.        Objective:     BP 118/68   Pulse 63   Temp 98 F (36.7 C)   Resp 16   Ht  5' 9 (1.753 m)   Wt 200 lb (90.7 kg)   SpO2 98%   BMI 29.53 kg/m  Wt Readings from Last 3 Encounters:  12/15/23 200 lb (90.7 kg)  10/07/23 201 lb (91.2 kg)  08/15/23 204 lb (92.5 kg)    Physical Exam Constitutional:      General: He is not in acute distress.    Appearance: Normal appearance. He is well-developed.  HENT:     Head: Normocephalic and atraumatic.     Right Ear: External ear normal.     Left Ear: External ear normal.     Mouth/Throat:     Pharynx: No oropharyngeal exudate or posterior oropharyngeal erythema.  Eyes:     General: No scleral icterus.       Right eye: No discharge.        Left eye: No discharge.  Cardiovascular:     Rate and Rhythm: Normal rate and regular rhythm.  Pulmonary:     Effort: Pulmonary effort is normal. No respiratory distress.     Breath sounds: Normal breath sounds.  Abdominal:     General: Bowel sounds are normal.     Palpations: Abdomen is soft.     Tenderness: There is no abdominal tenderness.  Musculoskeletal:        General: No swelling or tenderness.     Cervical back: Neck supple. No tenderness.  Lymphadenopathy:     Cervical: No cervical adenopathy.  Skin:    Findings: No erythema or rash.  Neurological:     Mental Status: He is alert.  Psychiatric:        Mood and Affect: Mood normal.        Behavior: Behavior  normal.         Outpatient Encounter Medications as of 12/15/2023  Medication Sig   albuterol  (VENTOLIN  HFA) 108 (90 Base) MCG/ACT inhaler Inhale 2 puffs into the lungs every 6 (six) hours as needed for wheezing or shortness of breath.   aspirin  EC 81 MG tablet Take 81 mg by mouth every other day. Swallow whole.   ezetimibe  (ZETIA ) 10 MG tablet TAKE 1 TABLET BY MOUTH ONCE DAILY   Fluticasone  Furoate (ARNUITY ELLIPTA ) 100 MCG/ACT AEPB Inhale into the lungs daily.   isosorbide  mononitrate (IMDUR ) 60 MG 24 hr tablet TAKE ONE TABLET BY MOUTH ONCE DAILY   losartan  (COZAAR ) 50 MG tablet TAKE 1 TABLET BY MOUTH DAILY   metoprolol  tartrate (LOPRESSOR ) 25 MG tablet TAKE 1 TABLET BY MOUTH TWICE DAILY   nitroGLYCERIN  (NITROSTAT ) 0.4 MG SL tablet Place 1 tablet (0.4 mg total) under the tongue every 5 (five) minutes as needed for chest pain.   rosuvastatin  (CRESTOR ) 10 MG tablet TAKE 1 TABLET BY MOUTH DAILY   No facility-administered encounter medications on file as of 12/15/2023.     Lab Results  Component Value Date   WBC 6.3 07/14/2023   HGB 13.0 07/14/2023   HCT 39.5 07/14/2023   PLT 288.0 07/14/2023   GLUCOSE 97 09/07/2023   CHOL 118 09/07/2023   TRIG 68.0 09/07/2023   HDL 42.60 09/07/2023   LDLCALC 62 09/07/2023   ALT 18 09/07/2023   AST 25 09/07/2023   NA 136 09/07/2023   K 4.7 09/07/2023   CL 106 09/07/2023   CREATININE 1.30 09/07/2023   BUN 21 09/07/2023   CO2 24 09/07/2023   TSH 1.438 09/02/2022   PSA 1.48 04/13/2023   INR 1.0 06/22/2017   HGBA1C 5.8 04/13/2023    CT CHEST LUNG  CA SCREEN LOW DOSE W/O CM Result Date: 05/30/2023 CLINICAL DATA:  68 year old male with 24 pack-year history of smoking. Lung cancer screening. EXAM: CT CHEST WITHOUT CONTRAST LOW-DOSE FOR LUNG CANCER SCREENING TECHNIQUE: Multidetector CT imaging of the chest was performed following the standard protocol without IV contrast. RADIATION DOSE REDUCTION: This exam was performed according to the  departmental dose-optimization program which includes automated exposure control, adjustment of the mA and/or kV according to patient size and/or use of iterative reconstruction technique. COMPARISON:  05/05/2022 FINDINGS: Cardiovascular: The heart size is normal. No substantial pericardial effusion. Coronary artery calcification is evident. Mild atherosclerotic calcification is noted in the wall of the thoracic aorta. Mediastinum/Nodes: No mediastinal lymphadenopathy. No evidence for gross hilar lymphadenopathy although assessment is limited by the lack of intravenous contrast on the current study. The esophagus has normal imaging features. There is no axillary lymphadenopathy. Lungs/Pleura: Centrilobular and paraseptal emphysema evident. Pulmonary nodules identified previously are stable in the interval no new suspicious pulmonary nodule or mass. No focal airspace consolidation. No pleural effusion. Upper Abdomen: Tiny left adrenal nodule compatible with adenoma, stable. No followup imaging is recommended. Tiny calcified gallstone evident. Musculoskeletal: No worrisome lytic or sclerotic osseous abnormality. IMPRESSION: 1. Lung-RADS 2, benign appearance or behavior. Continue annual screening with low-dose chest CT without contrast in 12 months. 2. Cholelithiasis. 3. Aortic Atherosclerosis (ICD10-I70.0) and Emphysema (ICD10-J43.9). Electronically Signed   By: Camellia Candle M.D.   On: 05/30/2023 13:22       Assessment & Plan:  Essential hypertension Assessment & Plan: Continue losartan . Blood pressure doing well.  Follow pressures. No changes in medication. Check metabolic panel today.   Orders: -     Basic metabolic panel with GFR  Coronary artery disease of native artery of native heart with stable angina pectoris [facility]) Assessment & Plan:  Saw cardiology 07/08/23 - stable. Recommended to continue aspirin  every other day, crestor , zetia . He reports he is doing well. Breathing better. Walking. No chest  pain reported. No change in medication.    Hyperlipidemia LDL goal <70 Assessment & Plan: Continue crestor  and zetia . Tolerating. Low cholesterol diet and exercise. Follow lipid panel and liver function tests.   Orders: -     Hepatic function panel -     Lipid panel -     TSH  Uncomplicated asthma, unspecified asthma severity, unspecified whether persistent Assessment & Plan: Has seen Dr Shellia.  Has albuterol  to take prn.  Continue arnuity. Discussed today. Breathing stable. Uses albuterol  prior to exercise.    Statin myopathy Assessment & Plan: Tolerating low dose crestor  and zetia . Continue.    Personal history of tobacco use, presenting hazards to health Assessment & Plan: Quit smoking 6 years ago.  States smoked for 45 years. Planning follow up low dose CT can in 04/2024.    Hyperglycemia Assessment & Plan: Previously documented. Continue diet and exercise. Check A1c today.    History of cardiac arrest Assessment & Plan: S/p AICD - stable. Being followed by EP. ECHO 07/13/23 - EF 55-60%. Stable. Walking. Stable.    Stage 3a chronic kidney disease (HCC) Assessment & Plan: Avoid antiinflammatories.  Continue losartan .  Follow metabolic panel.       Allena Hamilton, MD

## 2023-12-19 ENCOUNTER — Ambulatory Visit: Payer: Self-pay | Admitting: Internal Medicine

## 2023-12-19 DIAGNOSIS — R7989 Other specified abnormal findings of blood chemistry: Secondary | ICD-10-CM

## 2023-12-26 DIAGNOSIS — Z95810 Presence of automatic (implantable) cardiac defibrillator: Secondary | ICD-10-CM | POA: Diagnosis not present

## 2023-12-26 DIAGNOSIS — I469 Cardiac arrest, cause unspecified: Secondary | ICD-10-CM | POA: Diagnosis not present

## 2023-12-30 ENCOUNTER — Other Ambulatory Visit (INDEPENDENT_AMBULATORY_CARE_PROVIDER_SITE_OTHER)

## 2023-12-30 DIAGNOSIS — R7989 Other specified abnormal findings of blood chemistry: Secondary | ICD-10-CM | POA: Diagnosis not present

## 2023-12-30 LAB — HEPATIC FUNCTION PANEL
ALT: 26 U/L (ref 0–53)
AST: 37 U/L (ref 0–37)
Albumin: 4.4 g/dL (ref 3.5–5.2)
Alkaline Phosphatase: 59 U/L (ref 39–117)
Bilirubin, Direct: 0.2 mg/dL (ref 0.0–0.3)
Total Bilirubin: 1 mg/dL (ref 0.2–1.2)
Total Protein: 7 g/dL (ref 6.0–8.3)

## 2023-12-30 LAB — BASIC METABOLIC PANEL WITH GFR
BUN: 24 mg/dL — ABNORMAL HIGH (ref 6–23)
CO2: 28 meq/L (ref 19–32)
Calcium: 9 mg/dL (ref 8.4–10.5)
Chloride: 102 meq/L (ref 96–112)
Creatinine, Ser: 1.3 mg/dL (ref 0.40–1.50)
GFR: 55.99 mL/min — ABNORMAL LOW (ref >60.00)
Glucose, Bld: 83 mg/dL (ref 70–99)
Potassium: 5.5 meq/L — ABNORMAL HIGH (ref 3.5–5.1)
Sodium: 135 meq/L (ref 135–145)

## 2024-01-01 ENCOUNTER — Ambulatory Visit: Payer: Self-pay | Admitting: Internal Medicine

## 2024-01-01 DIAGNOSIS — E875 Hyperkalemia: Secondary | ICD-10-CM

## 2024-01-02 ENCOUNTER — Other Ambulatory Visit: Payer: Self-pay | Admitting: Internal Medicine

## 2024-01-02 DIAGNOSIS — E875 Hyperkalemia: Secondary | ICD-10-CM

## 2024-01-02 NOTE — Progress Notes (Signed)
Order placed for f/u stat potassium.  

## 2024-01-03 NOTE — Addendum Note (Signed)
 Addended by: TANDA HARVEY D on: 01/03/2024 01:58 PM   Modules accepted: Orders

## 2024-01-04 ENCOUNTER — Ambulatory Visit: Payer: Self-pay | Admitting: Internal Medicine

## 2024-01-04 ENCOUNTER — Other Ambulatory Visit (INDEPENDENT_AMBULATORY_CARE_PROVIDER_SITE_OTHER)

## 2024-01-04 ENCOUNTER — Other Ambulatory Visit: Payer: Self-pay | Admitting: Internal Medicine

## 2024-01-04 DIAGNOSIS — E875 Hyperkalemia: Secondary | ICD-10-CM

## 2024-01-04 LAB — POTASSIUM: Potassium: 4.7 meq/L (ref 3.5–5.1)

## 2024-01-18 DIAGNOSIS — I469 Cardiac arrest, cause unspecified: Secondary | ICD-10-CM | POA: Diagnosis not present

## 2024-01-18 DIAGNOSIS — I10 Essential (primary) hypertension: Secondary | ICD-10-CM | POA: Diagnosis not present

## 2024-01-18 DIAGNOSIS — I2510 Atherosclerotic heart disease of native coronary artery without angina pectoris: Secondary | ICD-10-CM | POA: Diagnosis not present

## 2024-01-18 DIAGNOSIS — Z95810 Presence of automatic (implantable) cardiac defibrillator: Secondary | ICD-10-CM | POA: Diagnosis not present

## 2024-01-25 ENCOUNTER — Other Ambulatory Visit: Payer: Self-pay | Admitting: Internal Medicine

## 2024-01-25 DIAGNOSIS — I10 Essential (primary) hypertension: Secondary | ICD-10-CM

## 2024-02-07 DIAGNOSIS — L821 Other seborrheic keratosis: Secondary | ICD-10-CM | POA: Diagnosis not present

## 2024-02-07 DIAGNOSIS — L570 Actinic keratosis: Secondary | ICD-10-CM | POA: Diagnosis not present

## 2024-02-07 DIAGNOSIS — L814 Other melanin hyperpigmentation: Secondary | ICD-10-CM | POA: Diagnosis not present

## 2024-02-07 DIAGNOSIS — D229 Melanocytic nevi, unspecified: Secondary | ICD-10-CM | POA: Diagnosis not present

## 2024-02-07 DIAGNOSIS — Z85828 Personal history of other malignant neoplasm of skin: Secondary | ICD-10-CM | POA: Diagnosis not present

## 2024-02-16 DIAGNOSIS — K080 Exfoliation of teeth due to systemic causes: Secondary | ICD-10-CM | POA: Diagnosis not present

## 2024-02-20 ENCOUNTER — Encounter: Payer: Self-pay | Admitting: Internal Medicine

## 2024-02-21 ENCOUNTER — Encounter: Payer: Self-pay | Admitting: Internal Medicine

## 2024-02-21 DIAGNOSIS — Z23 Encounter for immunization: Secondary | ICD-10-CM

## 2024-02-21 MED ORDER — COVID-19 MRNA VACC (MODERNA) 50 MCG/0.5ML IM SUSP: 0.5000 mL | Freq: Once | INTRAMUSCULAR | 0 refills | Status: AC

## 2024-02-21 NOTE — Telephone Encounter (Signed)
Prescription pended for review

## 2024-02-21 NOTE — Telephone Encounter (Signed)
 Rx for covid vaccine sent to [facility].

## 2024-03-01 ENCOUNTER — Other Ambulatory Visit: Payer: Self-pay | Admitting: Internal Medicine

## 2024-03-14 ENCOUNTER — Encounter: Payer: Self-pay | Admitting: Internal Medicine

## 2024-03-14 DIAGNOSIS — M25561 Pain in right knee: Secondary | ICD-10-CM | POA: Insufficient documentation

## 2024-03-14 DIAGNOSIS — M1711 Unilateral primary osteoarthritis, right knee: Secondary | ICD-10-CM | POA: Diagnosis not present

## 2024-03-26 DIAGNOSIS — I469 Cardiac arrest, cause unspecified: Secondary | ICD-10-CM | POA: Diagnosis not present

## 2024-03-26 DIAGNOSIS — Z95810 Presence of automatic (implantable) cardiac defibrillator: Secondary | ICD-10-CM | POA: Diagnosis not present

## 2024-04-24 ENCOUNTER — Encounter: Payer: Self-pay | Admitting: Internal Medicine

## 2024-04-24 ENCOUNTER — Ambulatory Visit: Admitting: Internal Medicine

## 2024-04-24 VITALS — BP 132/78 | HR 60 | Temp 97.8°F | Ht 69.0 in | Wt 202.0 lb

## 2024-04-24 DIAGNOSIS — I25118 Atherosclerotic heart disease of native coronary artery with other forms of angina pectoris: Secondary | ICD-10-CM

## 2024-04-24 DIAGNOSIS — I10 Essential (primary) hypertension: Secondary | ICD-10-CM | POA: Diagnosis not present

## 2024-04-24 DIAGNOSIS — F17201 Nicotine dependence, unspecified, in remission: Secondary | ICD-10-CM

## 2024-04-24 DIAGNOSIS — Z125 Encounter for screening for malignant neoplasm of prostate: Secondary | ICD-10-CM | POA: Diagnosis not present

## 2024-04-24 DIAGNOSIS — G720 Drug-induced myopathy: Secondary | ICD-10-CM

## 2024-04-24 DIAGNOSIS — Z860100 Personal history of colon polyps, unspecified: Secondary | ICD-10-CM

## 2024-04-24 DIAGNOSIS — E785 Hyperlipidemia, unspecified: Secondary | ICD-10-CM | POA: Diagnosis not present

## 2024-04-24 DIAGNOSIS — Z0000 Encounter for general adult medical examination without abnormal findings: Secondary | ICD-10-CM

## 2024-04-24 DIAGNOSIS — N1831 Chronic kidney disease, stage 3a: Secondary | ICD-10-CM

## 2024-04-24 DIAGNOSIS — I6523 Occlusion and stenosis of bilateral carotid arteries: Secondary | ICD-10-CM

## 2024-04-24 DIAGNOSIS — M25561 Pain in right knee: Secondary | ICD-10-CM

## 2024-04-24 DIAGNOSIS — Z87891 Personal history of nicotine dependence: Secondary | ICD-10-CM

## 2024-04-24 DIAGNOSIS — J45909 Unspecified asthma, uncomplicated: Secondary | ICD-10-CM

## 2024-04-24 DIAGNOSIS — R739 Hyperglycemia, unspecified: Secondary | ICD-10-CM

## 2024-04-24 DIAGNOSIS — Z8674 Personal history of sudden cardiac arrest: Secondary | ICD-10-CM

## 2024-04-24 DIAGNOSIS — T466X5A Adverse effect of antihyperlipidemic and antiarteriosclerotic drugs, initial encounter: Secondary | ICD-10-CM

## 2024-04-24 LAB — BASIC METABOLIC PANEL WITH GFR
BUN: 27 mg/dL — ABNORMAL HIGH (ref 6–23)
CO2: 25 meq/L (ref 19–32)
Calcium: 8.5 mg/dL (ref 8.4–10.5)
Chloride: 105 meq/L (ref 96–112)
Creatinine, Ser: 1.46 mg/dL (ref 0.40–1.50)
GFR: 48.6 mL/min — ABNORMAL LOW (ref >60.00)
Glucose, Bld: 88 mg/dL (ref 70–99)
Potassium: 4.5 meq/L (ref 3.5–5.1)
Sodium: 138 meq/L (ref 135–145)

## 2024-04-24 LAB — LIPID PANEL
Cholesterol: 128 mg/dL (ref 0–200)
HDL: 55 mg/dL (ref >39.00)
LDL Cholesterol: 63 mg/dL (ref 0–99)
NonHDL: 72.69
Total CHOL/HDL Ratio: 2
Triglycerides: 49 mg/dL (ref 0.0–149.0)
VLDL: 9.8 mg/dL (ref 0.0–40.0)

## 2024-04-24 LAB — CBC WITH DIFFERENTIAL/PLATELET
Basophils Absolute: 0.1 K/uL (ref 0.0–0.1)
Basophils Relative: 1.1 % (ref 0.0–3.0)
Eosinophils Absolute: 0.1 K/uL (ref 0.0–0.7)
Eosinophils Relative: 1.6 % (ref 0.0–5.0)
HCT: 41.4 % (ref 39.0–52.0)
Hemoglobin: 14.5 g/dL (ref 13.0–17.0)
Lymphocytes Relative: 30.3 % (ref 12.0–46.0)
Lymphs Abs: 1.9 K/uL (ref 0.7–4.0)
MCHC: 35 g/dL (ref 30.0–36.0)
MCV: 91.8 fl (ref 78.0–100.0)
Monocytes Absolute: 0.6 K/uL (ref 0.1–1.0)
Monocytes Relative: 9.2 % (ref 3.0–12.0)
Neutro Abs: 3.7 K/uL (ref 1.4–7.7)
Neutrophils Relative %: 57.8 % (ref 43.0–77.0)
Platelets: 207 K/uL (ref 150.0–400.0)
RBC: 4.51 Mil/uL (ref 4.22–5.81)
RDW: 13.5 % (ref 11.5–15.5)
WBC: 6.4 K/uL (ref 4.0–10.5)

## 2024-04-24 LAB — PSA, MEDICARE: PSA: 1.19 ng/mL (ref 0.10–4.00)

## 2024-04-24 LAB — HEPATIC FUNCTION PANEL
ALT: 26 U/L (ref 0–53)
AST: 39 U/L — ABNORMAL HIGH (ref 0–37)
Albumin: 4.2 g/dL (ref 3.5–5.2)
Alkaline Phosphatase: 50 U/L (ref 39–117)
Bilirubin, Direct: 0.2 mg/dL (ref 0.0–0.3)
Total Bilirubin: 0.9 mg/dL (ref 0.2–1.2)
Total Protein: 6.7 g/dL (ref 6.0–8.3)

## 2024-04-24 MED ORDER — LOSARTAN POTASSIUM 50 MG PO TABS: 50.0000 mg | ORAL_TABLET | Freq: Every day | ORAL | 1 refills | Status: AC

## 2024-04-24 MED ORDER — EZETIMIBE 10 MG PO TABS
10.0000 mg | ORAL_TABLET | Freq: Every day | ORAL | 1 refills | Status: AC
Start: 2024-04-24 — End: ?

## 2024-04-24 NOTE — Assessment & Plan Note (Signed)
 Physical today 04/24/24. Colonoscopy 06/22/22 - pathology - COLON POLYP, TRANSVERSE; COLD BIOPSY: HYPERPLASTIC POLYP, MILDLY INFLAMED. NEGATIVE FOR DYSPLASIA AND MALIGNANCY. Recommend f/u in 7 years. Check psa today.

## 2024-04-24 NOTE — Progress Notes (Signed)
 Subjective:    Patient ID: Christopher Villegas, male    DOB: 1956-04-27, 68 y.o.   MRN: [id number]  Patient here for  Chief Complaint  Patient presents with   Annual Exam    HPI Here for a physical exam. Saw cardiology 10/07/23 - recommended continuing aspirin , ezetimibe  and crestor , as well as metoprolol  and isosorbide . F/u carotid ultrasound - 11/2023. Had f/u with pulmonary 10/10/23 - leg cramps from breztri  and dulera . Breo did not help. Recommended airsupra prn. Effusion cleared. Recommended low dose CT chest 04/2024. (Smoked for 45 years). He is doing well. Walking - 3 miles per day - 5-6 days per week. No chest pain or sob reported. No abdominal pain or bowel change reported. Right knee - OA - injection.    Past Medical History:  Diagnosis Date   Arthritis    Asthma    CAD (coronary artery disease)    a. 06/2017 Cath: LM 30ost/d, LAD 59m, D1/2 large, nl, D3 small, LCX 20ost, 40p, OM1/2/3 nl, RCA 40/139m - CTO w/ L->R collats. RPDA fills via collats from 1st Septal. EF 55-65%-->Med rx; b. 10/2022 Cath: 10/2022 Cath: LM 30ost/d, LAD 30m, LCx 25ost, 20p, RCA 28m, 116m CTO, RPDA fills via 1st septal. Nl R heart pressures-->Med Rx.   Cancer [facility])    Basal Cell carcinoma   Cardiac arrest (HCC) 05/2023   Carotid arterial disease    a. 2009 s/p R CEA; b. 05/2018 U/S: RICA 1-39%, RECA >50%, LICA 1-39%, LECA <50%.   GERD (gastroesophageal reflux disease)    History of colon polyps    History of echocardiogram    a. 10/2015 Echo: EF 55%. Mild TR/MR; b. 10/2022 Echo: EF 55-60%, no rwma, nl diast fxn, nl RV fxn, mildly dil LA, no significant valvular disease.   Hyperlipidemia    Hypertension    Substance abuse (HCC) quit drinking 03-20-1989   Past Surgical History:  Procedure Laterality Date   BICEPS TENDON REPAIR  2021   CARDIAC DEFIBRILLATOR PLACEMENT  05/30/2023   CAROTID ENDARTERECTOMY Right 2009   COLONOSCOPY WITH PROPOFOL  N/A 06/16/2017   Procedure: COLONOSCOPY WITH PROPOFOL ;   Surgeon: Jinny Carmine, MD;  Location: [facility];  Service: Endoscopy;  Laterality: N/A;   COLONOSCOPY WITH PROPOFOL  N/A 06/22/2022   Procedure: COLONOSCOPY WITH PROPOFOL ;  Surgeon: Jinny Carmine, MD;  Location: [facility] ENDOSCOPY;  Service: Endoscopy;  Laterality: N/A;   CYST EXCISION PERINEAL     ESOPHAGOGASTRODUODENOSCOPY (EGD) WITH PROPOFOL  N/A 06/16/2017   Procedure: ESOPHAGOGASTRODUODENOSCOPY (EGD) WITH PROPOFOL ;  Surgeon: Jinny Carmine, MD;  Location: [facility];  Service: Endoscopy;  Laterality: N/A;   LEFT HEART CATH AND CORONARY ANGIOGRAPHY N/A 07/01/2017   Procedure: LEFT HEART CATH AND CORONARY ANGIOGRAPHY;  Surgeon: Mady Bruckner, MD;  Location: [facility] INVASIVE CV LAB;  Service: Cardiovascular;  Laterality: N/A;   POLYPECTOMY  06/16/2017   Procedure: POLYPECTOMY;  Surgeon: Jinny Carmine, MD;  Location: [facility];  Service: Endoscopy;;   RIGHT/LEFT HEART CATH AND CORONARY ANGIOGRAPHY Bilateral 10/20/2022   Procedure: RIGHT/LEFT HEART CATH AND CORONARY ANGIOGRAPHY;  Surgeon: Mady Bruckner, MD;  Location: [facility] INVASIVE CV LAB;  Service: Cardiovascular;  Laterality: Bilateral;   TONSILLECTOMY AND ADENOIDECTOMY     TUBAL LIGATION  1997   Family History  Problem Relation Age of Onset   Cancer Mother        breast   Hypertension Mother    Hyperlipidemia Mother    Arthritis Mother        osteo  Hypertension Father    Heart disease Father        CHF   Kidney failure Father        causing death   COPD Father    Alcohol abuse Father    Kidney disease Father    Autoimmune disease Sister    Lupus Sister    Healthy Son    Healthy Son    Social History   Socioeconomic History   Marital status: Married    Spouse name: Beth   Number of children: 2   Years of education: 16   Highest education level: Bachelor's degree (e.g., BA, AB, BS)  Occupational History   Occupation: [employer]  Tobacco Use   Smoking status: Former    Current  packs/day: 0.00    Average packs/day: 1.5 packs/day for 45.0 years (67.5 ttl pk-yrs)    Types: Cigarettes    Start date: 09/28/1969    Quit date: 09/29/2014    Years since quitting: 9.5    Passive exposure: Past   Smokeless tobacco: Never  Vaping Use   Vaping status: Never Used  Substance and Sexual Activity   Alcohol use: No    Comment: Former Alcoholic - sober since 03/20/1989   Drug use: No   Sexual activity: Yes    Birth control/protection: Surgical  Other Topics Concern   Not on file  Social History Narrative   Married   Works 2 jobs   Social Drivers of Corporate Investment Banker Strain: Low Risk  (04/20/2024)   Overall Financial Resource Strain (CARDIA)    Difficulty of Paying Living Expenses: Not hard at all  Food Insecurity: No Food Insecurity (04/20/2024)   Hunger Vital Sign    Worried About Running Out of Food in the Last Year: Never true    Ran Out of Food in the Last Year: Never true  Transportation Needs: No Transportation Needs (04/20/2024)   PRAPARE - Administrator, Civil Service (Medical): No    Lack of Transportation (Non-Medical): No  Physical Activity: Sufficiently Active (04/20/2024)   Exercise Vital Sign    Days of Exercise per Week: 5 days    Minutes of Exercise per Session: 60 min  Stress: No Stress Concern Present (04/20/2024)   [employer] of Occupational Health - Occupational Stress Questionnaire    Feeling of Stress: Not at all  Social Connections: Socially Integrated (04/20/2024)   Social Connection and Isolation Panel    Frequency of Communication with Friends and Family: More than three times a week    Frequency of Social Gatherings with Friends and Family: Twice a week    Attends Religious Services: More than 4 times per year    Active Member of [employer] or Organizations: Yes    Attends Engineer, Structural: More than 4 times per year    Marital Status: Married     Review of Systems  Constitutional:  Negative for  appetite change and unexpected weight change.  HENT:  Negative for congestion, sinus pressure and sore throat.   Eyes:  Negative for pain and visual disturbance.  Respiratory:  Negative for cough, chest tightness and shortness of breath.   Cardiovascular:  Negative for chest pain, palpitations and leg swelling.  Gastrointestinal:  Negative for abdominal pain, diarrhea, nausea and vomiting.  Genitourinary:  Negative for difficulty urinating and dysuria.  Musculoskeletal:  Negative for myalgias.       Right knee - OA.   Skin:  Negative  for color change and rash.  Neurological:  Negative for dizziness and headaches.  Hematological:  Negative for adenopathy. Does not bruise/bleed easily.  Psychiatric/Behavioral:  Negative for agitation and dysphoric mood.        Objective:     BP 132/78   Pulse 60   Temp 97.8 F (36.6 C) (Oral)   Ht 5' 9 (1.753 m)   Wt 202 lb (91.6 kg)   SpO2 96%   BMI 29.83 kg/m  Wt Readings from Last 3 Encounters:  04/24/24 202 lb (91.6 kg)  12/15/23 200 lb (90.7 kg)  10/07/23 201 lb (91.2 kg)    Physical Exam Constitutional:      General: He is not in acute distress.    Appearance: Normal appearance. He is well-developed.  HENT:     Head: Normocephalic and atraumatic.     Right Ear: External ear normal.     Left Ear: External ear normal.     Mouth/Throat:     Pharynx: No oropharyngeal exudate or posterior oropharyngeal erythema.  Eyes:     General: No scleral icterus.       Right eye: No discharge.        Left eye: No discharge.     Conjunctiva/sclera: Conjunctivae normal.  Neck:     Thyroid : No thyromegaly.  Cardiovascular:     Rate and Rhythm: Normal rate and regular rhythm.  Pulmonary:     Effort: No respiratory distress.     Breath sounds: Normal breath sounds. No wheezing.  Abdominal:     General: Bowel sounds are normal.     Palpations: Abdomen is soft.     Tenderness: There is no abdominal tenderness.  Musculoskeletal:         General: No swelling or tenderness.     Cervical back: Neck supple. No tenderness.  Lymphadenopathy:     Cervical: No cervical adenopathy.  Skin:    Findings: No erythema or rash.  Neurological:     Mental Status: He is alert and oriented to person, place, and time.  Psychiatric:        Mood and Affect: Mood normal.        Behavior: Behavior normal.         Outpatient Encounter Medications as of 04/24/2024  Medication Sig   albuterol  (VENTOLIN  HFA) 108 (90 Base) MCG/ACT inhaler Inhale 2 puffs into the lungs every 6 (six) hours as needed for wheezing or shortness of breath.   aspirin  EC 81 MG tablet Take 81 mg by mouth every other day. Swallow whole.   Fluticasone  Furoate (ARNUITY ELLIPTA ) 100 MCG/ACT AEPB Inhale into the lungs daily.   isosorbide  mononitrate (IMDUR ) 60 MG 24 hr tablet TAKE ONE TABLET BY MOUTH ONCE DAILY   metoprolol  tartrate (LOPRESSOR ) 25 MG tablet TAKE 1 TABLET BY MOUTH TWICE DAILY   nitroGLYCERIN  (NITROSTAT ) 0.4 MG SL tablet Place 1 tablet (0.4 mg total) under the tongue every 5 (five) minutes as needed for chest pain.   rosuvastatin  (CRESTOR ) 10 MG tablet TAKE 1 TABLET BY MOUTH DAILY   ezetimibe  (ZETIA ) 10 MG tablet Take 1 tablet (10 mg total) by mouth daily.   losartan  (COZAAR ) 50 MG tablet Take 1 tablet (50 mg total) by mouth daily.   [DISCONTINUED] ezetimibe  (ZETIA ) 10 MG tablet TAKE 1 TABLET BY MOUTH ONCE DAILY   [DISCONTINUED] losartan  (COZAAR ) 50 MG tablet TAKE 1 TABLET BY MOUTH DAILY   No facility-administered encounter medications on file as of 04/24/2024.     Lab  Results  Component Value Date   WBC 6.4 04/24/2024   HGB 14.5 04/24/2024   HCT 41.4 04/24/2024   PLT 207.0 04/24/2024   GLUCOSE 88 04/24/2024   CHOL 128 04/24/2024   TRIG 49.0 04/24/2024   HDL 55.00 04/24/2024   LDLCALC 63 04/24/2024   ALT 26 04/24/2024   AST 39 (H) 04/24/2024   NA 138 04/24/2024   K 4.5 04/24/2024   CL 105 04/24/2024   CREATININE 1.46 04/24/2024   BUN 27 (H)  04/24/2024   CO2 25 04/24/2024   TSH 1.31 12/15/2023   PSA 1.19 04/24/2024   INR 1.0 06/22/2017   HGBA1C 5.8 04/13/2023    CT CHEST LUNG CA SCREEN LOW DOSE W/O CM Result Date: 05/30/2023 CLINICAL DATA:  68 year old male with 38 pack-year history of smoking. Lung cancer screening. EXAM: CT CHEST WITHOUT CONTRAST LOW-DOSE FOR LUNG CANCER SCREENING TECHNIQUE: Multidetector CT imaging of the chest was performed following the standard protocol without IV contrast. RADIATION DOSE REDUCTION: This exam was performed according to the departmental dose-optimization program which includes automated exposure control, adjustment of the mA and/or kV according to patient size and/or use of iterative reconstruction technique. COMPARISON:  05/05/2022 FINDINGS: Cardiovascular: The heart size is normal. No substantial pericardial effusion. Coronary artery calcification is evident. Mild atherosclerotic calcification is noted in the wall of the thoracic aorta. Mediastinum/Nodes: No mediastinal lymphadenopathy. No evidence for gross hilar lymphadenopathy although assessment is limited by the lack of intravenous contrast on the current study. The esophagus has normal imaging features. There is no axillary lymphadenopathy. Lungs/Pleura: Centrilobular and paraseptal emphysema evident. Pulmonary nodules identified previously are stable in the interval no new suspicious pulmonary nodule or mass. No focal airspace consolidation. No pleural effusion. Upper Abdomen: Tiny left adrenal nodule compatible with adenoma, stable. No followup imaging is recommended. Tiny calcified gallstone evident. Musculoskeletal: No worrisome lytic or sclerotic osseous abnormality. IMPRESSION: 1. Lung-RADS 2, benign appearance or behavior. Continue annual screening with low-dose chest CT without contrast in 12 months. 2. Cholelithiasis. 3. Aortic Atherosclerosis (ICD10-I70.0) and Emphysema (ICD10-J43.9). Electronically Signed   By: Camellia Candle M.D.   On:  05/30/2023 13:22       Assessment & Plan:  Healthcare maintenance Assessment & Plan: Physical today 04/24/24. Colonoscopy 06/22/22 - pathology - COLON POLYP, TRANSVERSE; COLD BIOPSY: HYPERPLASTIC POLYP, MILDLY INFLAMED. NEGATIVE FOR DYSPLASIA AND MALIGNANCY. Recommend f/u in 7 years. Check psa today.    Essential hypertension Assessment & Plan: Continue losartan . Blood pressure doing well.  Follow pressures. No change in medication today.   Orders: -     Losartan  Potassium; Take 1 tablet (50 mg total) by mouth daily.  Dispense: 90 tablet; Refill: 1 -     Basic metabolic panel with GFR -     CBC with Differential/Platelet -     Basic metabolic panel with GFR; Future  Hyperlipidemia LDL goal <70 Assessment & Plan: Continue crestor  and zetia . Tolerating. Low cholesterol diet and exercise. Follow lipid panel and liver function tests.   Orders: -     Lipid panel -     Hepatic function panel -     Hepatic function panel; Future -     Lipid panel; Future  Prostate cancer screening -     PSA, Medicare  Uncomplicated asthma, unspecified asthma severity, unspecified whether persistent Assessment & Plan: Had f/u with pulmonary 10/10/23 - leg cramps from breztri  and dulera . Breo did not help. Recommended airsupra prn. Effusion cleared. Recommended low dose CT chest 04/2024. (Smoked  for 45 years). He is doing well. Walking - 3 miles per day - 5-6 days per week.   Tobacco abuse, in remission Assessment & Plan: Had f/u with pulmonary 10/10/23 - leg cramps from breztri  and dulera . Breo did not help. Recommended airsupra prn. Effusion cleared. Recommended low dose CT chest 04/2024. (Smoked for 45 years). He is doing well. Walking - 3 miles per day - 5-6 days per week.   Statin myopathy Assessment & Plan: Tolerating crestor  and zetia .    Right knee pain, unspecified chronicity Assessment & Plan: [facility] - 02/2024 - right knee OA - s/p injection. Walking.    Personal history of tobacco  use, presenting hazards to health Assessment & Plan: Quit smoking 6 years ago.  States smoked for 45 years. Planning follow up low dose CT can in 04/2024.    Hyperglycemia Assessment & Plan: Previously documented. Continue diet and exercise. Follow met b and A1c.    History of colon polyps Assessment & Plan: Colonoscopy 06/2017 - Three 2 to 4 mm polyps in the descending colon. One 3 mm polyp in the sigmoid colon.  Recommended f/u 5 years.  Colonoscopy 06/2022 - one 2mm polyp transverse colon and diverticulosis.  Recommended f/u colonoscopy in 7 years.    History of cardiac arrest Assessment & Plan: S/p AICD - stable. Being followed by EP. ECHO 07/13/23 - EF 55-60%. Walking. Contiue f/u with cardiology. Doing well.    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 Recommended to continue aspirin , crestor , zetia . He reports he is doing well. Breathing better. Walking. No chest pain reported. No change in medication. Continue metoprolol  and isosorbide . Continue exercise.    Stage 3a chronic kidney disease (HCC) Assessment & Plan: Avoid antiinflammatories.  Continue losartan .  Follow metabolic panel.    Bilateral carotid artery stenosis Assessment & Plan: carotid artery stenosis status post right carotid endarterectomy. Continue risk factor modification.  Continue Crestor  and Zetia .  Continue aspirin .   Other orders -     Ezetimibe ; Take 1 tablet (10 mg total) by mouth daily.  Dispense: 90 tablet; Refill: 1     Allena Hamilton, MD

## 2024-04-25 ENCOUNTER — Ambulatory Visit: Payer: Self-pay | Admitting: Internal Medicine

## 2024-04-25 DIAGNOSIS — Z85828 Personal history of other malignant neoplasm of skin: Secondary | ICD-10-CM | POA: Diagnosis not present

## 2024-04-25 DIAGNOSIS — K130 Diseases of lips: Secondary | ICD-10-CM | POA: Diagnosis not present

## 2024-04-25 DIAGNOSIS — L821 Other seborrheic keratosis: Secondary | ICD-10-CM | POA: Diagnosis not present

## 2024-04-30 ENCOUNTER — Encounter: Payer: Self-pay | Admitting: Internal Medicine

## 2024-04-30 NOTE — Assessment & Plan Note (Signed)
 Had f/u with pulmonary 10/10/23 - leg cramps from breztri  and dulera . Breo did not help. Recommended airsupra prn. Effusion cleared. Recommended low dose CT chest 04/2024. (Smoked for 45 years). He is doing well. Walking - 3 miles per day - 5-6 days per week.

## 2024-04-30 NOTE — Assessment & Plan Note (Signed)
 Tolerating crestor  and zetia .

## 2024-04-30 NOTE — Assessment & Plan Note (Signed)
 S/p AICD - stable. Being followed by EP. ECHO 07/13/23 - EF 55-60%. Walking. Contiue f/u with cardiology. Doing well.

## 2024-04-30 NOTE — Assessment & Plan Note (Signed)
 Saw cardiology 10/07/23 - stable. Recommended to continue aspirin , crestor , zetia . He reports he is doing well. Breathing better. Walking. No chest pain reported. No change in medication. Continue metoprolol  and isosorbide . Continue exercise.

## 2024-04-30 NOTE — Assessment & Plan Note (Signed)
Avoid antiinflammatories.  Continue losartan.  Follow metabolic panel.  

## 2024-04-30 NOTE — Assessment & Plan Note (Signed)
 Previously documented. Continue diet and exercise. Follow met b and A1c.

## 2024-04-30 NOTE — Assessment & Plan Note (Signed)
 Continue losartan . Blood pressure doing well.  Follow pressures. No change in medication today.

## 2024-04-30 NOTE — Assessment & Plan Note (Signed)
 Quit smoking 6 years ago.  States smoked for 45 years. Planning follow up low dose CT can in 04/2024.

## 2024-04-30 NOTE — Assessment & Plan Note (Signed)
 carotid artery stenosis status post right carotid endarterectomy. Continue risk factor modification.  Continue Crestor and Zetia.  Continue aspirin.

## 2024-04-30 NOTE — Assessment & Plan Note (Signed)
 Continue crestor and zetia. Tolerating. Low cholesterol diet and exercise. Follow lipid panel and liver function tests.

## 2024-04-30 NOTE — Assessment & Plan Note (Signed)
 [facility] - 02/2024 - right knee OA - s/p injection. Walking.

## 2024-04-30 NOTE — Assessment & Plan Note (Signed)
Colonoscopy 06/2017 - Three 2 to 4 mm polyps in the descending colon. One 3 mm polyp in the sigmoid colon.  Recommended f/u 5 years.  Colonoscopy 06/2022 - one 2mm polyp transverse colon and diverticulosis.  Recommended f/u colonoscopy in 7 years.

## 2024-05-02 DIAGNOSIS — H2513 Age-related nuclear cataract, bilateral: Secondary | ICD-10-CM | POA: Diagnosis not present

## 2024-05-02 DIAGNOSIS — H43813 Vitreous degeneration, bilateral: Secondary | ICD-10-CM | POA: Diagnosis not present

## 2024-05-09 ENCOUNTER — Ambulatory Visit
Admission: RE | Admit: 2024-05-09 | Discharge: 2024-05-09 | Disposition: A | Discharge status: Home / Self Care | Source: Ambulatory Visit | Attending: Acute Care | Admitting: Acute Care

## 2024-05-09 DIAGNOSIS — Z87891 Personal history of nicotine dependence: Secondary | ICD-10-CM | POA: Insufficient documentation

## 2024-05-09 DIAGNOSIS — F17210 Nicotine dependence, cigarettes, uncomplicated: Secondary | ICD-10-CM | POA: Insufficient documentation

## 2024-05-09 DIAGNOSIS — Z122 Encounter for screening for malignant neoplasm of respiratory organs: Secondary | ICD-10-CM | POA: Insufficient documentation

## 2024-05-16 ENCOUNTER — Other Ambulatory Visit

## 2024-05-16 DIAGNOSIS — E785 Hyperlipidemia, unspecified: Secondary | ICD-10-CM

## 2024-05-16 DIAGNOSIS — I10 Essential (primary) hypertension: Secondary | ICD-10-CM | POA: Diagnosis not present

## 2024-05-16 LAB — BASIC METABOLIC PANEL WITH GFR
BUN: 23 mg/dL (ref 6–23)
CO2: 26 meq/L (ref 19–32)
Calcium: 8.6 mg/dL (ref 8.4–10.5)
Chloride: 103 meq/L (ref 96–112)
Creatinine, Ser: 1.27 mg/dL (ref 0.40–1.50)
GFR: 57.43 mL/min — ABNORMAL LOW (ref >60.00)
Glucose, Bld: 104 mg/dL — ABNORMAL HIGH (ref 70–99)
Potassium: 4.4 meq/L (ref 3.5–5.1)
Sodium: 136 meq/L (ref 135–145)

## 2024-05-16 LAB — LIPID PANEL
Cholesterol: 124 mg/dL (ref 0–200)
HDL: 47.1 mg/dL (ref >39.00)
LDL Cholesterol: 65 mg/dL (ref 0–99)
NonHDL: 76.71
Total CHOL/HDL Ratio: 3
Triglycerides: 59 mg/dL (ref 0.0–149.0)
VLDL: 11.8 mg/dL (ref 0.0–40.0)

## 2024-05-16 LAB — HEPATIC FUNCTION PANEL
ALT: 29 U/L (ref 0–53)
AST: 36 U/L (ref 0–37)
Albumin: 4.2 g/dL (ref 3.5–5.2)
Alkaline Phosphatase: 48 U/L (ref 39–117)
Bilirubin, Direct: 0.2 mg/dL (ref 0.0–0.3)
Total Bilirubin: 0.9 mg/dL (ref 0.2–1.2)
Total Protein: 6.8 g/dL (ref 6.0–8.3)

## 2024-05-17 ENCOUNTER — Other Ambulatory Visit: Payer: Self-pay

## 2024-05-17 DIAGNOSIS — Z122 Encounter for screening for malignant neoplasm of respiratory organs: Secondary | ICD-10-CM

## 2024-05-17 DIAGNOSIS — Z87891 Personal history of nicotine dependence: Secondary | ICD-10-CM

## 2024-07-05 ENCOUNTER — Encounter: Payer: Self-pay | Admitting: Internal Medicine

## 2024-07-05 ENCOUNTER — Ambulatory Visit: Admitting: Internal Medicine

## 2024-07-05 VITALS — BP 126/70 | HR 60 | Ht 69.0 in | Wt 210.6 lb

## 2024-07-05 DIAGNOSIS — E785 Hyperlipidemia, unspecified: Secondary | ICD-10-CM

## 2024-07-05 DIAGNOSIS — I6523 Occlusion and stenosis of bilateral carotid arteries: Secondary | ICD-10-CM

## 2024-07-05 DIAGNOSIS — I25118 Atherosclerotic heart disease of native coronary artery with other forms of angina pectoris: Secondary | ICD-10-CM

## 2024-07-05 DIAGNOSIS — I469 Cardiac arrest, cause unspecified: Secondary | ICD-10-CM

## 2024-07-05 DIAGNOSIS — I10 Essential (primary) hypertension: Secondary | ICD-10-CM

## 2024-07-05 NOTE — Progress Notes (Signed)
" °  Cardiology Office Note:  .   Date:  07/05/2024  ID:  Christopher Villegas, DOB [birth year], MRN [id number] PCP: Glendia Shad, MD  [facility] Providers Cardiologist:  Lonni Hanson, MD     History of Present Illness: .   Christopher Villegas is a 69 y.o. male with history of coronary artery disease (CTO of RCA), cardiac arrest status post ICD (05/2023), hypertension, hyperlipidemia, carotid artery stenosis status post right carotid endarterectomy (2009), asthma, GERD, and remote tobacco use, who presents for follow-up of coronary artery disease and cardiac arrest.  I last saw Christopher Villegas in April, at which time he was feeling very well.  We did not make any medication changes or pursue additional testing.  He subsequently saw Dr. Fairy in the cardiac genetics clinic in May; genetic testing was ultimately deferred.  Today, Christopher Villegas reports that he has continued to feel well.  He denies chest pain, shortness of breath, palpitations, lightheadedness, and edema.  He has not had any ICD shocks and continues to follow with EP at [facility].  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  ROS: See HPI  Studies Reviewed: SABRA   EKG Interpretation Date/Time:  Thursday July 05 2024 08:16:12 EST Ventricular Rate:  60 PR Interval:  202 QRS Duration:  90 QT Interval:  420 QTC Calculation: 420 R Axis:   24  Text Interpretation: Atrial-paced rhythm When compared with ECG of 08-Jul-2023 15:54, Electronic atrial pacemaker has replaced Sinus rhythm Questionable change in QRS axis Confirmed by Braedon Sjogren [phone]) on 07/05/2024 8:38:31 AM     Risk Assessment/Calculations:            Physical Exam:   VS:  BP 126/70   Pulse 60   Ht 5' 9 (1.753 m)   Wt 210 lb 9.6 oz (95.5 kg)   SpO2 97%   BMI 31.10 kg/m    Wt Readings from Last 3 Encounters:  07/05/24 210 lb 9.6 oz (95.5 kg)  05/09/24 195 lb (88.5 kg)  04/24/24 202 lb (91.6 kg)    General:  NAD. Neck: No JVD or  HJR. Lungs: Clear to auscultation bilaterally without wheezes or crackles. Heart: Regular rate and rhythm without murmurs, rubs, or gallops. Abdomen: Soft, nontender, nondistended. Extremities: No lower extremity edema.  ASSESSMENT AND PLAN: .    Coronary artery disease with stable angina: No chest pain reported in the setting of RCA CTO.  Continue current antianginal therapy consisting of isosorbide  mononitrate and metoprolol  tartrate.  Sudden cardiac death s/p ICD: No recurrent events noted.  Continue low-dose metoprolol  and ICD follow-up though [facility].  Hyperlipidemia: Lipids at goal with LDL 65 and TG 59 on last check in 05/2024.  Continue rosuvastatin  10 mg daily and ezetimibe  10 mg daily given concerns for myopathy with high-intensity statin therapy.  Hypertension: BP well-controlled today.  Continue current regimen of losartan , isosorbide  mononitrate, and metoprolol  tartrate.  Carotid artery stenosis: Minimal CCA/ICA plaquing s/p right CEA noted on last check in 11/2023.  Will continue lipid therapy and aspirin .  Will repeat carotid Doppler at Christopher Villegas convenience.    Dispo: Return to clinic in 1 year.  Signed, Lonni Hanson, MD  "

## 2024-07-05 NOTE — Patient Instructions (Signed)
 Medication Instructions:  Your physician recommends that you continue on your current medications as directed. Please refer to the Current Medication list given to you today.   *If you need a refill on your cardiac medications before your next appointment, please call your pharmacy*  Lab Work: No labs ordered today  If you have labs (blood work) drawn today and your tests are completely normal, you will receive your results only by: MyChart Message (if you have MyChart) OR A paper copy in the mail If you have any lab test that is abnormal or we need to change your treatment, we will call you to review the results.  Testing/Procedures: No test ordered today   Follow-Up: At [facility], you and your health needs are our priority.  As part of our continuing mission to provide you with exceptional heart care, our providers are all part of one team.  This team includes your primary Cardiologist (physician) and Advanced Practice Providers or APPs (Physician Assistants and Nurse Practitioners) who all work together to provide you with the care you need, when you need it.  Your next appointment:   1 year(s)  Provider:   You may see Lonni Hanson, MD or one of the following Advanced Practice Providers on your designated Care Team:   Lonni Meager, NP Lesley Maffucci, PA-C Bernardino Bring, PA-C Cadence [place], PA-C Tylene Lunch, NP Barnie Hila, NP    We recommend signing up for the patient portal called MyChart.  Sign up information is provided on this After Visit Summary.  MyChart is used to connect with patients for Virtual Visits (Telemedicine).  Patients are able to view lab/test results, encounter notes, upcoming appointments, etc.  Non-urgent messages can be sent to your provider as well.   To learn more about what you can do with MyChart, go to [email or web address].   Other Instructions

## 2024-07-10 ENCOUNTER — Telehealth: Payer: Self-pay

## 2024-07-10 DIAGNOSIS — I6523 Occlusion and stenosis of bilateral carotid arteries: Secondary | ICD-10-CM

## 2024-07-10 NOTE — Telephone Encounter (Signed)
 Pt made aware and verbalized understanding.

## 2024-07-10 NOTE — Telephone Encounter (Signed)
 Attempted to contact pt. Left message to call back   End, Lonni, MD  Desiderio Russell SAILOR, RN Erskin Russell,  I realized after our visit that it has been more than a year since Mr. Fonder had a carotid Doppler for follow-up of his endarterectomy.  Do you mind setting him up for a carotid Doppler at his convenience?  Thanks.  [place]

## 2024-07-26 ENCOUNTER — Ambulatory Visit

## 2024-08-29 ENCOUNTER — Ambulatory Visit: Admitting: Internal Medicine
